# Patient Record
Sex: Female | Born: 1990 | Race: White | Hispanic: No | Marital: Single | Smoking: Current every day smoker
Health system: Southern US, Community
[De-identification: ages and names within clinical notes are randomized; demographics above are authoritative.]

## PROBLEM LIST (undated history)

## (undated) ENCOUNTER — Inpatient Hospital Stay (HOSPITAL_COMMUNITY): Payer: Medicaid Other

## (undated) DIAGNOSIS — O219 Vomiting of pregnancy, unspecified: Secondary | ICD-10-CM

## (undated) DIAGNOSIS — N83209 Unspecified ovarian cyst, unspecified side: Secondary | ICD-10-CM

## (undated) DIAGNOSIS — G8929 Other chronic pain: Secondary | ICD-10-CM

## (undated) DIAGNOSIS — Z3490 Encounter for supervision of normal pregnancy, unspecified, unspecified trimester: Secondary | ICD-10-CM

## (undated) DIAGNOSIS — R102 Pelvic and perineal pain: Secondary | ICD-10-CM

## (undated) DIAGNOSIS — M549 Dorsalgia, unspecified: Secondary | ICD-10-CM

## (undated) DIAGNOSIS — Z309 Encounter for contraceptive management, unspecified: Secondary | ICD-10-CM

## (undated) DIAGNOSIS — R87629 Unspecified abnormal cytological findings in specimens from vagina: Secondary | ICD-10-CM

## (undated) DIAGNOSIS — F1190 Opioid use, unspecified, uncomplicated: Secondary | ICD-10-CM

## (undated) DIAGNOSIS — Z3046 Encounter for surveillance of implantable subdermal contraceptive: Principal | ICD-10-CM

## (undated) HISTORY — DX: Unspecified abnormal cytological findings in specimens from vagina: R87.629

## (undated) HISTORY — DX: Encounter for supervision of normal pregnancy, unspecified, unspecified trimester: Z34.90

## (undated) HISTORY — DX: Opioid use, unspecified, uncomplicated: F11.90

## (undated) HISTORY — DX: Encounter for contraceptive management, unspecified: Z30.9

## (undated) HISTORY — DX: Encounter for surveillance of implantable subdermal contraceptive: Z30.46

## (undated) HISTORY — DX: Vomiting of pregnancy, unspecified: O21.9

---
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
## 2008-02-20 ENCOUNTER — Emergency Department: Payer: Self-pay | Admitting: Emergency Medicine

## 2009-01-08 ENCOUNTER — Emergency Department (HOSPITAL_COMMUNITY): Admission: EM | Admit: 2009-01-08 | Discharge: 2009-01-08 | Payer: Self-pay | Admitting: Emergency Medicine

## 2009-06-03 ENCOUNTER — Emergency Department (HOSPITAL_COMMUNITY): Admission: EM | Admit: 2009-06-03 | Discharge: 2009-06-04 | Payer: Self-pay | Admitting: Emergency Medicine

## 2009-09-10 ENCOUNTER — Emergency Department (HOSPITAL_COMMUNITY): Admission: EM | Admit: 2009-09-10 | Discharge: 2009-09-10 | Payer: Self-pay | Admitting: Emergency Medicine

## 2009-10-17 ENCOUNTER — Emergency Department (HOSPITAL_COMMUNITY): Admission: EM | Admit: 2009-10-17 | Discharge: 2009-10-18 | Payer: Self-pay | Admitting: Emergency Medicine

## 2009-11-05 ENCOUNTER — Emergency Department (HOSPITAL_COMMUNITY): Admission: EM | Admit: 2009-11-05 | Discharge: 2009-11-06 | Payer: Self-pay | Admitting: Emergency Medicine

## 2010-02-13 ENCOUNTER — Emergency Department (HOSPITAL_COMMUNITY): Admission: EM | Admit: 2010-02-13 | Discharge: 2010-02-13 | Payer: Self-pay | Admitting: Emergency Medicine

## 2010-02-18 ENCOUNTER — Encounter (INDEPENDENT_AMBULATORY_CARE_PROVIDER_SITE_OTHER): Payer: Self-pay | Admitting: *Deleted

## 2010-02-27 ENCOUNTER — Encounter (INDEPENDENT_AMBULATORY_CARE_PROVIDER_SITE_OTHER): Payer: Self-pay | Admitting: *Deleted

## 2011-01-08 ENCOUNTER — Emergency Department (HOSPITAL_COMMUNITY)
Admission: EM | Admit: 2011-01-08 | Discharge: 2011-01-08 | Payer: Self-pay | Source: Home / Self Care | Admitting: Emergency Medicine

## 2011-01-12 LAB — BASIC METABOLIC PANEL
BUN: 6 mg/dL (ref 6–23)
CO2: 24 mEq/L (ref 19–32)
Calcium: 9.5 mg/dL (ref 8.4–10.5)
Chloride: 104 mEq/L (ref 96–112)
Creatinine, Ser: 0.9 mg/dL (ref 0.4–1.2)
GFR calc Af Amer: >60 mL/min (ref >60)
GFR calc non Af Amer: >60 mL/min (ref >60)
Glucose, Bld: 89 mg/dL (ref 70–99)
Potassium: 4 mEq/L (ref 3.5–5.1)
Sodium: 140 mEq/L (ref 135–145)

## 2011-01-12 LAB — URINALYSIS, ROUTINE W REFLEX MICROSCOPIC
Nitrite: NEGATIVE
Specific Gravity, Urine: 1.02 (ref 1.005–1.030)
Urine Glucose, Fasting: NEGATIVE mg/dL
Urobilinogen, UA: 0.2 mg/dL (ref 0.0–1.0)
pH: 7 (ref 5.0–8.0)

## 2011-01-12 LAB — DIFFERENTIAL
Basophils Absolute: 0 10*3/uL (ref 0.0–0.1)
Basophils Relative: 0 % (ref 0–1)
Eosinophils Absolute: 0.1 10*3/uL (ref 0.0–0.7)
Eosinophils Relative: 1 % (ref 0–5)
Lymphocytes Relative: 18 % (ref 12–46)
Lymphs Abs: 1.6 10*3/uL (ref 0.7–4.0)
Monocytes Absolute: 0.3 10*3/uL (ref 0.1–1.0)
Monocytes Relative: 4 % (ref 3–12)
Neutro Abs: 7 10*3/uL (ref 1.7–7.7)
Neutrophils Relative %: 77 % (ref 43–77)

## 2011-01-12 LAB — URINE MICROSCOPIC-ADD ON

## 2011-01-12 LAB — CBC
HCT: 40.2 % (ref 36.0–46.0)
Hemoglobin: 13.5 g/dL (ref 12.0–15.0)
MCH: 30.2 pg (ref 26.0–34.0)
MCHC: 33.6 g/dL (ref 30.0–36.0)
MCV: 89.9 fL (ref 78.0–100.0)
Platelets: 297 10*3/uL (ref 150–400)
RBC: 4.47 MIL/uL (ref 3.87–5.11)
RDW: 12.2 % (ref 11.5–15.5)
WBC: 9.1 10*3/uL (ref 4.0–10.5)

## 2011-01-12 LAB — PREGNANCY, URINE: Preg Test, Ur: NEGATIVE

## 2011-01-24 ENCOUNTER — Emergency Department (HOSPITAL_COMMUNITY)
Admission: EM | Admit: 2011-01-24 | Discharge: 2011-01-24 | Payer: Self-pay | Source: Home / Self Care | Admitting: Emergency Medicine

## 2011-01-27 NOTE — Letter (Signed)
Summary: Generic Letter, Intro to Referring  [facility]  [address]   [place], [place] [address]   Phone: [phone]  Fax: [phone]      February 27, 2010             RE: Rebekah [name]   [birth year]                 [address] [place]  [address]  Dear Kemper Durie,  You referred this patient to our office for consult. We have tried to contact patient by phone and mail. Patient has failed to contact our office for appointment.   Sincerely,  [facility] Ph: [phone]   Fax: [phone]

## 2011-01-27 NOTE — Letter (Signed)
Summary: Appointment Reminder  [facility]  [address]   [place], [place] [address]   Phone: [phone]  Fax: [phone]       February 18, 2010   [place] [address] [place], [place]  [address] Jun 19, 1991    Dear Ms. Eastman,  We have been unable to reach you by phone to schedule a follow up   appointment that was recommended for you by Dr. Darrick Penna. It is very   important that we reach you to schedule an appointment. We hope that you  allow [place] to participate in your health care needs. Please contact us at  [phone] at your earliest convenience to schedule your appointment.  Sincerely,    [facility] R. Roetta Sessions, M.D.    Kassie Mends, M.D. Lorenza Burton, FNP-BC    Tana Coast, PA-C Phone: [phone]    Fax: [phone]

## 2011-02-09 ENCOUNTER — Emergency Department (HOSPITAL_COMMUNITY)
Admission: EM | Admit: 2011-02-09 | Discharge: 2011-02-09 | Discharge status: Against Medical Advice | Payer: Medicaid Other | Attending: Emergency Medicine | Admitting: Emergency Medicine

## 2011-02-09 DIAGNOSIS — R109 Unspecified abdominal pain: Secondary | ICD-10-CM | POA: Insufficient documentation

## 2011-02-09 DIAGNOSIS — R112 Nausea with vomiting, unspecified: Secondary | ICD-10-CM | POA: Insufficient documentation

## 2011-02-09 LAB — URINALYSIS, ROUTINE W REFLEX MICROSCOPIC
Bilirubin Urine: NEGATIVE
Hgb urine dipstick: NEGATIVE
Ketones, ur: NEGATIVE mg/dL
Nitrite: NEGATIVE
Protein, ur: NEGATIVE mg/dL
Specific Gravity, Urine: <1.005 — ABNORMAL LOW (ref 1.005–1.030)
Urine Glucose, Fasting: NEGATIVE mg/dL
Urobilinogen, UA: 0.2 mg/dL (ref 0.0–1.0)
pH: 6.5 (ref 5.0–8.0)

## 2011-02-09 LAB — POCT PREGNANCY, URINE: Preg Test, Ur: NEGATIVE

## 2011-02-12 ENCOUNTER — Emergency Department (HOSPITAL_COMMUNITY)
Admission: EM | Admit: 2011-02-12 | Discharge: 2011-02-13 | Disposition: A | Discharge status: Home / Self Care | Payer: Medicaid Other | Attending: Emergency Medicine | Admitting: Emergency Medicine

## 2011-02-12 DIAGNOSIS — E876 Hypokalemia: Secondary | ICD-10-CM | POA: Insufficient documentation

## 2011-02-12 DIAGNOSIS — R109 Unspecified abdominal pain: Secondary | ICD-10-CM | POA: Insufficient documentation

## 2011-02-12 DIAGNOSIS — R7989 Other specified abnormal findings of blood chemistry: Secondary | ICD-10-CM | POA: Insufficient documentation

## 2011-02-12 DIAGNOSIS — R112 Nausea with vomiting, unspecified: Secondary | ICD-10-CM | POA: Insufficient documentation

## 2011-02-12 LAB — DIFFERENTIAL
Basophils Absolute: 0 10*3/uL (ref 0.0–0.1)
Basophils Relative: 0 % (ref 0–1)
Eosinophils Absolute: 0.1 10*3/uL (ref 0.0–0.7)
Eosinophils Relative: 1 % (ref 0–5)
Lymphocytes Relative: 21 % (ref 12–46)
Lymphs Abs: 2.5 10*3/uL (ref 0.7–4.0)
Monocytes Absolute: 0.5 10*3/uL (ref 0.1–1.0)
Monocytes Relative: 4 % (ref 3–12)
Neutro Abs: 9 10*3/uL — ABNORMAL HIGH (ref 1.7–7.7)
Neutrophils Relative %: 74 % (ref 43–77)

## 2011-02-12 LAB — CBC
HCT: 37.2 % (ref 36.0–46.0)
Hemoglobin: 12.6 g/dL (ref 12.0–15.0)
MCH: 30.4 pg (ref 26.0–34.0)
MCHC: 33.9 g/dL (ref 30.0–36.0)
MCV: 89.6 fL (ref 78.0–100.0)
Platelets: 311 10*3/uL (ref 150–400)
RBC: 4.15 MIL/uL (ref 3.87–5.11)
RDW: 12.3 % (ref 11.5–15.5)
WBC: 12.2 10*3/uL — ABNORMAL HIGH (ref 4.0–10.5)

## 2011-02-12 LAB — POCT PREGNANCY, URINE: Preg Test, Ur: NEGATIVE

## 2011-02-13 LAB — URINALYSIS, ROUTINE W REFLEX MICROSCOPIC
Bilirubin Urine: NEGATIVE
Ketones, ur: NEGATIVE mg/dL
Leukocytes, UA: NEGATIVE
Nitrite: NEGATIVE
Protein, ur: NEGATIVE mg/dL
Specific Gravity, Urine: 1.02 (ref 1.005–1.030)
Urine Glucose, Fasting: NEGATIVE mg/dL
Urobilinogen, UA: 0.2 mg/dL (ref 0.0–1.0)
pH: 6.5 (ref 5.0–8.0)

## 2011-02-13 LAB — COMPREHENSIVE METABOLIC PANEL
ALT: 86 U/L — ABNORMAL HIGH (ref 0–35)
AST: 57 U/L — ABNORMAL HIGH (ref 0–37)
Albumin: 4 g/dL (ref 3.5–5.2)
Alkaline Phosphatase: 71 U/L (ref 39–117)
BUN: 5 mg/dL — ABNORMAL LOW (ref 6–23)
CO2: 24 mEq/L (ref 19–32)
Calcium: 9.2 mg/dL (ref 8.4–10.5)
Chloride: 108 mEq/L (ref 96–112)
Creatinine, Ser: 0.76 mg/dL (ref 0.4–1.2)
GFR calc Af Amer: >60 mL/min (ref >60)
GFR calc non Af Amer: >60 mL/min (ref >60)
Glucose, Bld: 115 mg/dL — ABNORMAL HIGH (ref 70–99)
Potassium: 2.9 mEq/L — ABNORMAL LOW (ref 3.5–5.1)
Sodium: 140 mEq/L (ref 135–145)
Total Bilirubin: 0.5 mg/dL (ref 0.3–1.2)
Total Protein: 6.5 g/dL (ref 6.0–8.3)

## 2011-02-13 LAB — LIPASE, BLOOD: Lipase: 26 U/L (ref 11–59)

## 2011-02-13 LAB — URINE MICROSCOPIC-ADD ON

## 2011-03-18 LAB — DIFFERENTIAL
Basophils Absolute: 0 10*3/uL (ref 0.0–0.1)
Basophils Relative: 1 % (ref 0–1)
Eosinophils Absolute: 0.1 10*3/uL (ref 0.0–0.7)
Eosinophils Relative: 2 % (ref 0–5)
Lymphocytes Relative: 31 % (ref 12–46)
Lymphs Abs: 2.2 10*3/uL (ref 0.7–4.0)
Monocytes Absolute: 0.4 10*3/uL (ref 0.1–1.0)
Monocytes Relative: 6 % (ref 3–12)
Neutro Abs: 4.3 10*3/uL (ref 1.7–7.7)
Neutrophils Relative %: 61 % (ref 43–77)

## 2011-03-18 LAB — BASIC METABOLIC PANEL
BUN: 1 mg/dL — ABNORMAL LOW (ref 6–23)
CO2: 29 mEq/L (ref 19–32)
Calcium: 9 mg/dL (ref 8.4–10.5)
Chloride: 105 mEq/L (ref 96–112)
Creatinine, Ser: 0.62 mg/dL (ref 0.4–1.2)
GFR calc Af Amer: >60 mL/min (ref >60)
GFR calc non Af Amer: >60 mL/min (ref >60)
Glucose, Bld: 93 mg/dL (ref 70–99)
Potassium: 3.2 mEq/L — ABNORMAL LOW (ref 3.5–5.1)
Sodium: 140 mEq/L (ref 135–145)

## 2011-03-18 LAB — URINALYSIS, ROUTINE W REFLEX MICROSCOPIC
Bilirubin Urine: NEGATIVE
Glucose, UA: NEGATIVE mg/dL
Hgb urine dipstick: NEGATIVE
Ketones, ur: NEGATIVE mg/dL
Nitrite: NEGATIVE
Protein, ur: NEGATIVE mg/dL
Specific Gravity, Urine: <1.005 — ABNORMAL LOW (ref 1.005–1.030)
Urobilinogen, UA: 0.2 mg/dL (ref 0.0–1.0)
pH: 6.5 (ref 5.0–8.0)

## 2011-03-18 LAB — CBC
HCT: 37 % (ref 36.0–46.0)
Hemoglobin: 12.5 g/dL (ref 12.0–15.0)
MCHC: 33.8 g/dL (ref 30.0–36.0)
MCV: 90.3 fL (ref 78.0–100.0)
Platelets: 265 10*3/uL (ref 150–400)
RBC: 4.1 MIL/uL (ref 3.87–5.11)
RDW: 11.9 % (ref 11.5–15.5)
WBC: 7 10*3/uL (ref 4.0–10.5)

## 2011-03-18 LAB — PREGNANCY, URINE: Preg Test, Ur: NEGATIVE

## 2011-04-01 LAB — URINALYSIS, ROUTINE W REFLEX MICROSCOPIC
Ketones, ur: NEGATIVE mg/dL
Leukocytes, UA: NEGATIVE
Nitrite: NEGATIVE
Protein, ur: NEGATIVE mg/dL
pH: 7 (ref 5.0–8.0)

## 2011-04-01 LAB — CBC
MCHC: 34.2 g/dL (ref 30.0–36.0)
MCV: 90.8 fL (ref 78.0–100.0)
Platelets: 227 10*3/uL (ref 150–400)
WBC: 8.6 10*3/uL (ref 4.0–10.5)

## 2011-04-01 LAB — DIFFERENTIAL
Eosinophils Relative: 2 % (ref 0–5)
Lymphocytes Relative: 30 % (ref 12–46)
Lymphs Abs: 2.6 10*3/uL (ref 0.7–4.0)
Monocytes Absolute: 0.4 10*3/uL (ref 0.1–1.0)
Monocytes Relative: 5 % (ref 3–12)

## 2011-04-01 LAB — COMPREHENSIVE METABOLIC PANEL
AST: 22 U/L (ref 0–37)
Albumin: 3.9 g/dL (ref 3.5–5.2)
Calcium: 9.1 mg/dL (ref 8.4–10.5)
Creatinine, Ser: 0.67 mg/dL (ref 0.4–1.2)
GFR calc Af Amer: >60 mL/min (ref >60)
Sodium: 138 mEq/L (ref 135–145)

## 2011-04-01 LAB — URINE MICROSCOPIC-ADD ON

## 2011-04-01 LAB — PREGNANCY, URINE: Preg Test, Ur: NEGATIVE

## 2011-04-02 LAB — URINALYSIS, ROUTINE W REFLEX MICROSCOPIC
Bilirubin Urine: NEGATIVE
Glucose, UA: NEGATIVE mg/dL
Nitrite: NEGATIVE
Specific Gravity, Urine: 1.015 (ref 1.005–1.030)
pH: 6 (ref 5.0–8.0)

## 2011-04-02 LAB — WET PREP, GENITAL: Yeast Wet Prep HPF POC: NONE SEEN

## 2011-04-02 LAB — URINE MICROSCOPIC-ADD ON

## 2011-04-03 LAB — URINALYSIS, ROUTINE W REFLEX MICROSCOPIC
Protein, ur: NEGATIVE mg/dL
Urobilinogen, UA: 1 mg/dL (ref 0.0–1.0)

## 2011-04-03 LAB — URINE MICROSCOPIC-ADD ON

## 2011-04-03 LAB — STREP A DNA PROBE: Group A Strep Probe: NEGATIVE

## 2011-04-03 LAB — RAPID STREP SCREEN (MED CTR MEBANE ONLY): Streptococcus, Group A Screen (Direct): NEGATIVE

## 2011-04-05 ENCOUNTER — Emergency Department (HOSPITAL_COMMUNITY)
Admission: EM | Admit: 2011-04-05 | Discharge: 2011-04-05 | Disposition: A | Discharge status: Home / Self Care | Payer: Self-pay | Attending: Emergency Medicine | Admitting: Emergency Medicine

## 2011-04-05 DIAGNOSIS — J029 Acute pharyngitis, unspecified: Secondary | ICD-10-CM | POA: Insufficient documentation

## 2011-04-05 DIAGNOSIS — R059 Cough, unspecified: Secondary | ICD-10-CM | POA: Insufficient documentation

## 2011-04-05 DIAGNOSIS — R05 Cough: Secondary | ICD-10-CM | POA: Insufficient documentation

## 2011-04-05 LAB — RAPID STREP SCREEN (MED CTR MEBANE ONLY): Streptococcus, Group A Screen (Direct): NEGATIVE

## 2011-04-06 LAB — URINALYSIS, ROUTINE W REFLEX MICROSCOPIC
Nitrite: NEGATIVE
Protein, ur: NEGATIVE mg/dL
Urobilinogen, UA: 1 mg/dL (ref 0.0–1.0)

## 2011-04-06 LAB — URINE MICROSCOPIC-ADD ON

## 2011-04-06 LAB — PREGNANCY, URINE: Preg Test, Ur: NEGATIVE

## 2011-06-08 ENCOUNTER — Emergency Department (HOSPITAL_COMMUNITY)
Admission: EM | Admit: 2011-06-08 | Discharge: 2011-06-08 | Disposition: A | Discharge status: Home / Self Care | Payer: Self-pay | Attending: Emergency Medicine | Admitting: Emergency Medicine

## 2011-06-08 DIAGNOSIS — F17200 Nicotine dependence, unspecified, uncomplicated: Secondary | ICD-10-CM | POA: Insufficient documentation

## 2011-06-08 DIAGNOSIS — K219 Gastro-esophageal reflux disease without esophagitis: Secondary | ICD-10-CM | POA: Insufficient documentation

## 2011-06-08 DIAGNOSIS — R1013 Epigastric pain: Secondary | ICD-10-CM | POA: Insufficient documentation

## 2011-06-08 LAB — URINALYSIS, ROUTINE W REFLEX MICROSCOPIC
Glucose, UA: NEGATIVE mg/dL
Ketones, ur: NEGATIVE mg/dL
Specific Gravity, Urine: 1.025 (ref 1.005–1.030)
pH: 6.5 (ref 5.0–8.0)

## 2011-06-08 LAB — POCT I-STAT, CHEM 8
BUN: 7 mg/dL (ref 6–23)
Calcium, Ion: 1.17 mmol/L (ref 1.12–1.32)
Creatinine, Ser: 0.7 mg/dL (ref 0.4–1.2)
TCO2: 25 mmol/L (ref 0–100)

## 2011-06-08 LAB — URINE MICROSCOPIC-ADD ON

## 2011-07-28 ENCOUNTER — Emergency Department (HOSPITAL_COMMUNITY)
Admission: EM | Admit: 2011-07-28 | Discharge: 2011-07-28 | Disposition: A | Discharge status: Home / Self Care | Payer: Self-pay | Attending: Emergency Medicine | Admitting: Emergency Medicine

## 2011-07-28 ENCOUNTER — Encounter: Payer: Self-pay | Admitting: Emergency Medicine

## 2011-07-28 DIAGNOSIS — N390 Urinary tract infection, site not specified: Secondary | ICD-10-CM | POA: Insufficient documentation

## 2011-07-28 DIAGNOSIS — F17200 Nicotine dependence, unspecified, uncomplicated: Secondary | ICD-10-CM | POA: Insufficient documentation

## 2011-07-28 DIAGNOSIS — R112 Nausea with vomiting, unspecified: Secondary | ICD-10-CM | POA: Insufficient documentation

## 2011-07-28 LAB — URINALYSIS, ROUTINE W REFLEX MICROSCOPIC
Hgb urine dipstick: NEGATIVE
Ketones, ur: 15 mg/dL — AB
Protein, ur: 30 mg/dL — AB
Specific Gravity, Urine: >1.03 — ABNORMAL HIGH (ref 1.005–1.030)
Urobilinogen, UA: 1 mg/dL (ref 0.0–1.0)

## 2011-07-28 LAB — BASIC METABOLIC PANEL
BUN: 12 mg/dL (ref 6–23)
Calcium: 9.5 mg/dL (ref 8.4–10.5)
GFR calc non Af Amer: >60 mL/min (ref >60)
Glucose, Bld: 89 mg/dL (ref 70–99)
Sodium: 140 mEq/L (ref 135–145)

## 2011-07-28 LAB — URINE MICROSCOPIC-ADD ON

## 2011-07-28 LAB — DIFFERENTIAL
Eosinophils Absolute: 0.1 10*3/uL (ref 0.0–0.7)
Eosinophils Relative: 1 % (ref 0–5)
Lymphs Abs: 1.7 10*3/uL (ref 0.7–4.0)

## 2011-07-28 LAB — CBC
MCH: 30.5 pg (ref 26.0–34.0)
MCV: 91.9 fL (ref 78.0–100.0)
Platelets: 300 10*3/uL (ref 150–400)
RBC: 4.72 MIL/uL (ref 3.87–5.11)

## 2011-07-28 MED ORDER — GI COCKTAIL ~~LOC~~
30.0000 mL | Freq: Once | ORAL | Status: AC
  Administered 2011-07-28: 30 mL via ORAL

## 2011-07-28 MED ORDER — ONDANSETRON HCL 4 MG/2ML IJ SOLN
4.0000 mg | Freq: Once | INTRAMUSCULAR | Status: AC
  Administered 2011-07-28: 4 mg via INTRAVENOUS

## 2011-07-28 MED ORDER — NITROFURANTOIN MONOHYD MACRO 100 MG PO CAPS: 100.0000 mg | ORAL_CAPSULE | Freq: Two times a day (BID) | ORAL | Status: DC

## 2011-07-28 MED ORDER — ONDANSETRON HCL 4 MG PO TABS: 4.0000 mg | ORAL_TABLET | Freq: Four times a day (QID) | ORAL | Status: DC

## 2011-07-28 MED ORDER — HYDROMORPHONE HCL 1 MG/ML IJ SOLN
1.0000 mg | Freq: Once | INTRAMUSCULAR | Status: AC
  Administered 2011-07-28: 1 mg via INTRAVENOUS

## 2011-07-28 MED ORDER — FAMOTIDINE 20 MG PO TABS
20.0000 mg | ORAL_TABLET | Freq: Once | ORAL | Status: AC
  Administered 2011-07-28: 20 mg via ORAL

## 2011-07-28 MED ORDER — NITROFURANTOIN MACROCRYSTAL 100 MG PO CAPS
100.0000 mg | ORAL_CAPSULE | Freq: Once | ORAL | Status: AC
  Administered 2011-07-28: 100 mg via ORAL

## 2011-07-28 MED ORDER — SODIUM CHLORIDE 0.9 % IV SOLN
Freq: Once | INTRAVENOUS | Status: AC
  Administered 2011-07-28: 21:00:00 via INTRAVENOUS

## 2011-07-28 NOTE — ED Notes (Addendum)
Medicated per md order for nausea and 9/10 pain. Denies any needs at this time. Nad. Call bell at bs. Bed in low position and locked with side rails up. Given warm blanket per request.

## 2011-07-28 NOTE — ED Notes (Signed)
Medicated per md order for nausea and 5\10 pain. Nad. Denies any needs at this time. Call bell at bs.

## 2011-07-28 NOTE — ED Notes (Signed)
Pt c/o vomiting since this am and states she cannot keep anything down.

## 2011-07-28 NOTE — ED Notes (Signed)
Medicated as ordered per md. Tolerated well. Denies any needs. Call bell at bs.

## 2011-07-28 NOTE — ED Provider Notes (Addendum)
History     Chief Complaint  Patient presents with  . Emesis   Patient is a 20 y.o. female presenting with vomiting. The history is provided by the patient. No language interpreter was used.  Emesis  This is a new problem. Episode onset: this AM. Episode frequency: 20= episodes today. The problem has been gradually improving. The emesis has an appearance of bilious material. There has been no fever. Associated symptoms include abdominal pain and a fever. Pertinent negatives include no diarrhea.    History reviewed. No pertinent past medical history.  History reviewed. No pertinent past surgical history.  Family History  Problem Relation Age of Onset  . Hypertension Other   . Diabetes Other     History  Substance Use Topics  . Smoking status: Current Everyday Smoker -- 1.0 packs/day    Types: Cigarettes  . Smokeless tobacco: Not on file  . Alcohol Use: Yes     saturday    OB History    Grav Para Term Preterm Abortions TAB SAB Ect Mult Living                  Review of Systems  Constitutional: Positive for fever and appetite change.  Gastrointestinal: Positive for vomiting and abdominal pain. Negative for diarrhea, blood in stool, abdominal distention and anal bleeding.  All other systems reviewed and are negative.    Physical Exam  BP 104/66  Pulse 78  Temp(Src) 98.5 F (36.9 C) (Oral)  Resp 18  Ht 5\' 2"  (1.575 m)  Wt 85 lb (38.556 kg)  BMI 15.55 kg/m2  SpO2 100%  Physical Exam  Nursing note and vitals reviewed. Constitutional: She is oriented to person, place, and time. Vital signs are normal. She appears well-developed and well-nourished. No distress.  HENT:  Head: Normocephalic and atraumatic.  Right Ear: External ear normal.  Left Ear: External ear normal.  Nose: Nose normal.  Mouth/Throat: No oropharyngeal exudate.  Eyes: Conjunctivae and EOM are normal. Pupils are equal, round, and reactive to light. Right eye exhibits no discharge. Left eye  exhibits no discharge. No scleral icterus.  Neck: Normal range of motion. Neck supple. No JVD present. No tracheal deviation present. No thyromegaly present.  Cardiovascular: Normal rate, regular rhythm, normal heart sounds, intact distal pulses and normal pulses.  Exam reveals no gallop and no friction rub.   No murmur heard. Pulmonary/Chest: Effort normal and breath sounds normal. No stridor. No respiratory distress. She has no wheezes. She has no rales. She exhibits no tenderness.  Abdominal: Soft. Normal appearance and bowel sounds are normal. She exhibits no distension and no mass. There is tenderness. There is guarding. There is no rebound.  Musculoskeletal: Normal range of motion. She exhibits no edema and no tenderness.  Lymphadenopathy:    She has no cervical adenopathy.  Neurological: She is alert and oriented to person, place, and time. She has normal reflexes. No cranial nerve deficit. Coordination normal. GCS eye subscore is 4. GCS verbal subscore is 5. GCS motor subscore is 6.  Reflex Scores:      Tricep reflexes are 2+ on the right side and 2+ on the left side.      Bicep reflexes are 2+ on the right side and 2+ on the left side.      Brachioradialis reflexes are 2+ on the right side and 2+ on the left side.      Patellar reflexes are 2+ on the right side and 2+ on the left side.  Achilles reflexes are 2+ on the right side and 2+ on the left side. Skin: Skin is warm and dry. No rash noted. She is not diaphoretic.  Psychiatric: She has a normal mood and affect. Her speech is normal and behavior is normal. Judgment and thought content normal. Cognition and memory are normal.    ED Course  Procedures  MDM    Medical screening examination/treatment/procedure(s) were performed by non-physician practitioner and as supervising physician I was immediately available for consultation/collaboration. Osvaldo Human, M.D.   Worthy Rancher, PA 07/28/11 2155  Worthy Rancher, PA 07/28/11 2156  Worthy Rancher, PA 07/28/11 2157  Carleene Cooper III, MD 07/29/11 1004  Carleene Cooper III, MD 09/04/11 [phone]

## 2011-07-28 NOTE — ED Notes (Signed)
Into room to see pt. Pain 5/10. Denies nausea. Call bell at bs. Bed in low position and locked with side rails up. Will cont to monitor.

## 2011-07-28 NOTE — ED Notes (Signed)
INTO ROOM TO SEE PT. RESTING SITTING UP IN BED. PAIN 3/10. DENIES NAUSEA. STATES SHE IS FEELING A LOT BETTER AND HASN'T HAD ANY ADDITIONAL EPISODES OF VOMITING. DENIES ANY NEEDS AT THIS TIME. CALL BELL AT BS.

## 2011-07-28 NOTE — ED Notes (Signed)
pa at bedside to evaluate.

## 2011-07-28 NOTE — ED Notes (Signed)
INTO ROOM TO SEE PT. PAIN 3/10. DENIES NAUSEA. DENIES ANY NEEDS. SITTING UP IN BED WATCHING TV. CALL BELL AT BS. WILL CONT TO MONITOR.

## 2011-07-28 NOTE — ED Notes (Signed)
PT OBTAINING URINE SPECIMEN VIA CLEAN CATCH AT THIS TIME. PER TRIAGE NURSE, PT HAS BEEN EATING DORITOS AND HAS DR. PEPPER IN BAG.

## 2011-07-28 NOTE — ED Notes (Signed)
MD at bedside to speak with pt about plan of care.

## 2011-07-28 NOTE — ED Notes (Signed)
INTO ROOM TO ASSESS PT. PT STATES SHE HAS HAD VOMITING AND BILATERAL UPPER QUADRANT ABD PAIN X 1 DAY. HASN'T BEEN ABLE TO KEEP ANYTHING DOWN ALL DAY. STATES PAIN IS A "RAW, SHARP, AND BURNING PAIN" THAT IS CONSTANT. NOTHING MAKES PAIN WORSE. BENDING OVER MAKES PAIN BETTER. LMP IS UNKNOWN DUE TO BIRTH CONTROL. TENDER ON PALPATION OF UPPER ABD QUADS. GIVEN WARM BLANKET PER REQUEST. DENIES ANY OTHER NEEDS. PENDING MD EVAL.

## 2011-07-29 ENCOUNTER — Encounter (HOSPITAL_COMMUNITY): Payer: Self-pay | Admitting: *Deleted

## 2011-07-29 ENCOUNTER — Emergency Department (HOSPITAL_COMMUNITY): Payer: Self-pay

## 2011-07-29 ENCOUNTER — Emergency Department (HOSPITAL_COMMUNITY)
Admission: EM | Admit: 2011-07-29 | Discharge: 2011-07-29 | Discharge status: Against Medical Advice | Payer: Self-pay | Attending: Emergency Medicine | Admitting: Emergency Medicine

## 2011-07-29 ENCOUNTER — Inpatient Hospital Stay (HOSPITAL_COMMUNITY)
Admission: EM | Admit: 2011-07-29 | Discharge: 2011-08-02 | DRG: 418 | Disposition: A | Discharge status: Home / Self Care | Payer: Self-pay | Attending: General Surgery | Admitting: General Surgery

## 2011-07-29 DIAGNOSIS — K8020 Calculus of gallbladder without cholecystitis without obstruction: Secondary | ICD-10-CM | POA: Insufficient documentation

## 2011-07-29 DIAGNOSIS — R110 Nausea: Secondary | ICD-10-CM | POA: Diagnosis not present

## 2011-07-29 DIAGNOSIS — Z5320 Procedure and treatment not carried out because of patient's decision for unspecified reasons: Secondary | ICD-10-CM | POA: Insufficient documentation

## 2011-07-29 DIAGNOSIS — K929 Disease of digestive system, unspecified: Secondary | ICD-10-CM | POA: Diagnosis not present

## 2011-07-29 DIAGNOSIS — Y836 Removal of other organ (partial) (total) as the cause of abnormal reaction of the patient, or of later complication, without mention of misadventure at the time of the procedure: Secondary | ICD-10-CM | POA: Diagnosis not present

## 2011-07-29 DIAGNOSIS — K810 Acute cholecystitis: Principal | ICD-10-CM | POA: Diagnosis present

## 2011-07-29 DIAGNOSIS — R1013 Epigastric pain: Secondary | ICD-10-CM

## 2011-07-29 LAB — COMPREHENSIVE METABOLIC PANEL
ALT: 76 U/L — ABNORMAL HIGH (ref 0–35)
Albumin: 3.8 g/dL (ref 3.5–5.2)
Alkaline Phosphatase: 91 U/L (ref 39–117)
BUN: 7 mg/dL (ref 6–23)
Calcium: 9.1 mg/dL (ref 8.4–10.5)
Potassium: 3.9 mEq/L (ref 3.5–5.1)
Sodium: 139 mEq/L (ref 135–145)
Total Protein: 6.8 g/dL (ref 6.0–8.3)

## 2011-07-29 LAB — CBC
MCHC: 32.8 g/dL (ref 30.0–36.0)
MCV: 93.5 fL (ref 78.0–100.0)
Platelets: 237 10*3/uL (ref 150–400)
RBC: 4.01 MIL/uL (ref 3.87–5.11)
RDW: 12.7 % (ref 11.5–15.5)
RDW: 12.7 % (ref 11.5–15.5)
WBC: 6.3 10*3/uL (ref 4.0–10.5)

## 2011-07-29 LAB — LIPASE, BLOOD: Lipase: 26 U/L (ref 11–59)

## 2011-07-29 LAB — DIFFERENTIAL
Basophils Absolute: 0 10*3/uL (ref 0.0–0.1)
Eosinophils Relative: 1 % (ref 0–5)
Lymphocytes Relative: 35 % (ref 12–46)
Lymphs Abs: 2.2 10*3/uL (ref 0.7–4.0)
Neutro Abs: 3.7 10*3/uL (ref 1.7–7.7)
Neutrophils Relative %: 58 % (ref 43–77)

## 2011-07-29 LAB — POCT PREGNANCY, URINE: Preg Test, Ur: NEGATIVE

## 2011-07-29 LAB — URINALYSIS, ROUTINE W REFLEX MICROSCOPIC
Glucose, UA: NEGATIVE mg/dL
Hgb urine dipstick: NEGATIVE
Specific Gravity, Urine: 1.025 (ref 1.005–1.030)
pH: 6 (ref 5.0–8.0)

## 2011-07-29 LAB — URINE MICROSCOPIC-ADD ON

## 2011-07-29 MED ORDER — HYDROMORPHONE HCL 1 MG/ML IJ SOLN
1.0000 mg | Freq: Once | INTRAMUSCULAR | Status: AC
  Administered 2011-07-29: 1 mg via INTRAVENOUS

## 2011-07-29 MED ORDER — SODIUM CHLORIDE 0.9 % IV BOLUS (SEPSIS)
1000.0000 mL | Freq: Once | INTRAVENOUS | Status: AC
  Administered 2011-07-29: 1000 mL via INTRAVENOUS

## 2011-07-29 MED ORDER — IOHEXOL 300 MG/ML  SOLN
100.0000 mL | Freq: Once | INTRAMUSCULAR | Status: AC | PRN
  Administered 2011-07-29: 100 mL via INTRAVENOUS

## 2011-07-29 MED ORDER — ONDANSETRON HCL 4 MG/2ML IJ SOLN
4.0000 mg | Freq: Once | INTRAMUSCULAR | Status: AC
  Administered 2011-07-29: 4 mg via INTRAVENOUS

## 2011-07-29 MED ORDER — SODIUM CHLORIDE 0.9 % IV SOLN
INTRAVENOUS | Status: DC
  Administered 2011-07-29: 13:00:00 via INTRAVENOUS

## 2011-07-29 NOTE — ED Notes (Signed)
Pt c/o abd cramping with n/v. Pt was here for the same yesterday and left ama.

## 2011-07-29 NOTE — ED Notes (Signed)
Pt had to leave due to family problems. Pt advised not to leave and stay to get treatment.

## 2011-07-29 NOTE — ED Notes (Signed)
Pt was seen here last night and was told she had a uti. Pt c/o continued upper abdominal pain and n/v.

## 2011-07-29 NOTE — ED Notes (Signed)
Pt was here earlier and had to leave due to family situation, states that they were talking about admitting her, pt presents to er to continue with tx, still continues to have epigastric pain,

## 2011-07-29 NOTE — ED Provider Notes (Signed)
History    Scribed for Rebekah Jakes, MD, the patient was seen in room APA05/APA05. This chart was scribed by Clarita Crane. This patient's care was started at 12:16PM.  Chief Complaint  Patient presents with  . Abdominal Pain  . Nausea  . Emesis   HPI  Patient is a 20 year old female c/o new onset epigastric abdominal pain since yesterday with associated upper back pain, nausea and episodes of vomiting too numerous to count. Denies hematemesis, hematochezia, SOB, chest pain, rash, vagnial d/c, vaginal bleeding, HA. Patient reports she was evalauted in ED last night for same with improvement in condition upon discharge but abdominal pain became worse this morning which is the reason for her return. States she was dx with a UTI last night and prescribed Macrobid-100mg  and Zofran but has not taken the medications yet as she has not had the prescriptions filled. Patient also notes previous history of similar epigastric abdominal pain several months ago and was told by ED physician that the pain may be the result of an ulcer.   PAST MEDICAL HISTORY:  History reviewed. No pertinent past medical history.  PAST SURGICAL HISTORY:  History reviewed. No pertinent past surgical history.  MEDICATIONS:  Previous Medications   ETONOGESTREL (IMPLANON Kasson)    Inject 1 each into the skin once.       ALLERGIES:  Allergies as of 07/29/2011  . (No Known Allergies)     FAMILY HISTORY:  Family History  Problem Relation Age of Onset  . Hypertension Other   . Diabetes Other      SOCIAL HISTORY: History   Social History  . Marital Status: Single    Spouse Name: N/A    Number of Children: N/A  . Years of Education: N/A   Social History Main Topics  . Smoking status: Current Everyday Smoker -- 1.0 packs/day    Types: Cigarettes  . Smokeless tobacco: None  . Alcohol Use: Yes     saturday  . Drug Use: No  . Sexually Active:    Other Topics Concern  . None   Social History Narrative    . None      OB History    Grav Para Term Preterm Abortions TAB SAB Ect Mult Living                  Review of Systems  Constitutional: Negative for fever.  HENT: Negative for rhinorrhea.   Eyes: Negative for pain.  Respiratory: Negative for cough and shortness of breath.   Cardiovascular: Negative for chest pain.  Gastrointestinal: Positive for nausea, vomiting and abdominal pain. Negative for diarrhea.  Genitourinary: Negative for dysuria.  Musculoskeletal: Negative for back pain.  Skin: Negative for rash.  Neurological: Negative for weakness and headaches.    Physical Exam  BP 97/58  Pulse 72  Temp(Src) 98.1 F (36.7 C) (Oral)  Resp 18  Ht 5\' 2"  (1.575 m)  Wt 93 lb (42.185 kg)  BMI 17.01 kg/m2  SpO2 100%  Physical Exam  Nursing note and vitals reviewed. Constitutional: She is oriented to person, place, and time. Vital signs are normal. She appears well-developed and well-nourished. No distress.  HENT:  Head: Normocephalic and atraumatic.  Eyes: Conjunctivae are normal. Pupils are equal, round, and reactive to light.  Neck: Normal range of motion. Neck supple.  Cardiovascular: Normal rate and regular rhythm.  Exam reveals no gallop and no friction rub.   No murmur heard. Pulmonary/Chest: Effort normal and breath sounds normal.  She has no wheezes.  Abdominal: Soft. Bowel sounds are normal. She exhibits no distension. There is tenderness in the epigastric area.  Musculoskeletal: Normal range of motion. She exhibits no edema.  Neurological: She is alert and oriented to person, place, and time. No sensory deficit.  Skin: Skin is warm and dry.  Psychiatric: She has a normal mood and affect. Her behavior is normal.    ED Course  Procedures  OTHER DATA REVIEWED: Nursing notes, vital signs, and past medical records reviewed.  Previous ED visits reviewed- Patient was evaluated last night (07/28/2011) by Marisa Sprinkles for new onset nausea and vomiting. Patient had a  CBC with diff showing an elevated WBC (12.5)) and elevated Relative Neurtophils (80), UA with many bacteria present and a negative urine pregnancy test.   Patient was also evaluated on 06/08/2011 by Dr. Eber Hong for c/o abdominal pain with n/v/d and dx with epigastric pain. Patient had I-Stat Chem 8 with elevated blood glucose level, negative urine pregnancy test and UA with few bacteria and elevated leukocyte esterase in urine. Patient was d/c home after treatment in ED.  ACUTE ABDOMEN SERIES (ABDOMEN 2 VIEW & CHEST 1 VIEW)  (performed on 02/13/2011, read by Dr. Carlota Raspberry) Comparison: CT abdomen 11/06/2009.  Findings: Lungs clear. Cardiopericardial silhouette within normal  limits. Trachea midline. No airspace disease or effusion. No free  air is present underneath the hemidiaphragms. Fluid is present  within the stomach. No organomegaly. No dilated bowel or  pathologic air fluid levels. Bowel gas extend to the level of the  rectosigmoid. General piercing present.  IMPRESSION:  No active cardiopulmonary disease. Normal bowel gas pattern  CT-Abdomen (performed on 11/05/2009, read by Dr. Molli Posey) Findings: The liver, spleen, stomach, duodenum, pancreas,  gallbladder, adrenal glands, and kidneys are unremarkable.  No abdominal lymphadenopathy. No intraperitoneal free fluid.  Prominent stool noted in the abdominal segments of the colon.  IMPRESSION:  Prominent stool volume in the abdominal segments of the colon.  Otherwise unremarkable.   CT-Pelvis (performed on 11/05/2009, read by Dr. Molli Posey) Findings: No pelvic sidewall lymphadenopathy. Bladder is  unremarkable. Uterus is unremarkable. No evidence for an adnexal  mass. 16 mm collapsing cyst or follicle is identified in the right  ovary.  The terminal ileum is normal. The appendix is normal.  Bone windows reveal no worrisome lytic or sclerotic osseous  lesions.  Trace amount of intraperitoneal free fluid is identified in the  pelvis.    IMPRESSION:  No acute findings in the pelvis. Trace amount of free fluid can be  physiologic in a premenopausal female.  Prominent stool throughout the colon. Imaging features would be  compatible with clinical constipation.  DIAGNOSTIC STUDIES:   LABS / RADIOLOGY: Results for orders placed during the hospital encounter of 07/29/11  URINALYSIS, ROUTINE W REFLEX MICROSCOPIC      Component Value Range   Color, Urine YELLOW  YELLOW    Appearance CLEAR  CLEAR    Specific Gravity, Urine 1.025  1.005 - 1.030    pH 6.0  5.0 - 8.0    Glucose, UA NEGATIVE  NEGATIVE (mg/dL)   Hgb urine dipstick NEGATIVE  NEGATIVE    Bilirubin Urine SMALL (*) NEGATIVE    Ketones, ur NEGATIVE  NEGATIVE (mg/dL)   Protein, ur NEGATIVE  NEGATIVE (mg/dL)   Urobilinogen, UA 1.0  0.0 - 1.0 (mg/dL)   Nitrite NEGATIVE  NEGATIVE    Leukocytes, UA MODERATE (*) NEGATIVE   POCT PREGNANCY, URINE      Component  Value Range   Preg Test, Ur NEGATIVE    URINE MICROSCOPIC-ADD ON      Component Value Range   Squamous Epithelial / LPF MANY (*) RARE    WBC, UA 3-6  <3 (WBC/hpf)   RBC / HPF 3-6  <3 (RBC/hpf)   Bacteria, UA FEW (*) RARE   CBC      Component Value Range   WBC 7.5  4.0 - 10.5 (K/uL)   RBC 4.30  3.87 - 5.11 (MIL/uL)   Hemoglobin 13.1  12.0 - 15.0 (g/dL)   HCT 16.1  09.6 - 04.5 (%)   MCV 92.8  78.0 - 100.0 (fL)   MCH 30.5  26.0 - 34.0 (pg)   MCHC 32.8  30.0 - 36.0 (g/dL)   RDW 40.9  81.1 - 91.4 (%)   Platelets 265  150 - 400 (K/uL)  COMPREHENSIVE METABOLIC PANEL      Component Value Range   Sodium 139  135 - 145 (mEq/L)   Potassium 3.9  3.5 - 5.1 (mEq/L)   Chloride 104  96 - 112 (mEq/L)   CO2 23  19 - 32 (mEq/L)   Glucose, Bld 89  70 - 99 (mg/dL)   BUN 7  6 - 23 (mg/dL)   Creatinine, Ser 7.82  0.50 - 1.10 (mg/dL)   Calcium 9.1  8.4 - 95.6 (mg/dL)   Total Protein 6.8  6.0 - 8.3 (g/dL)   Albumin 3.8  3.5 - 5.2 (g/dL)   AST 92 (*) 0 - 37 (U/L)   ALT 76 (*) 0 - 35 (U/L)   Alkaline Phosphatase 91   39 - 117 (U/L)   Total Bilirubin 2.5 (*) 0.3 - 1.2 (mg/dL)   GFR calc non Af Amer >60  >60 (mL/min)   GFR calc Af Amer >60  >60 (mL/min)  LIPASE, BLOOD      Component Value Range   Lipase 26  11 - 59 (U/L)   Dg Chest 2 View  07/29/2011  *RADIOLOGY REPORT*  Clinical Data: Abdominal pain radiating to back, heartburn, smoker, shortness of breath  CHEST - 2 VIEW  Comparison: 02/13/2010  Findings: Normal heart size, mediastinal contours, and pulmonary vascularity. Lungs clear. No pleural effusion or pneumothorax. Bones unremarkable.  IMPRESSION: Normal exam.  Original Report Authenticated By: Lollie Marrow, M.D.   Ct Abdomen Pelvis W Contrast  07/29/2011  *RADIOLOGY REPORT*  Clinical Data: Upper mid abdominal pain, nausea  CT ABDOMEN AND PELVIS WITH CONTRAST  Technique:  Multidetector CT imaging of the abdomen and pelvis was performed following the standard protocol during bolus administration of intravenous contrast.  Contrast: 100 ml Omnipaque-300 IV  Comparison: 11/06/2009  Findings: Lung bases are clear.  Liver is notable for mildly heterogeneous perfusion and mild periportal edema. Spleen, pancreas, adrenal glands are within normal limits.  Layering gallstones (series 2/image 35).  Mild gallbladder wall edema.  Kidneys are within normal limits.  No hydronephrosis.  No evidence of bowel obstruction.  Normal appendix.  Uterus and bilateral ovaries are grossly unremarkable.  Trace pelvic fluid, likely physiologic.  Bladder is within normal limits.  Visualized osseous structures are within normal limits.  IMPRESSION: Heterogeneous enhancement of the liver with mild periportal edema. While nonspecific, this appearance is suggestive of a primary hepatic inflammatory process.  Cholelithiasis, without associated findings to suggest acute cholecystitis.  Mild gallbladder wall edema, likely secondary to hepatic process.  Original Report Authenticated By: Charline Bills, M.D.     PROCEDURES:  ED COURSE /  COORDINATION  OF CARE: 3:34PM- Patient informed of lab and imaging results. Patient reports pain improved with pain medication Upon re-evaluation patient still diffusely tender. Advised of plan to admit patient based upon lab and imaging results and re-evaluation. Patient states she cannot be admitted because she has to go home. Patient advised of risk of not being admitted and the potential problems that could develop as a result.    MDM: Differential Diagnosis:  PROLONGED BILLIARY COLIC COULD PROGRESS TO CHOLECYSTITIS. PATIENT INSISTS ON LEAVING AMA.    PLAN: Admit The patient is to return the emergency department if there is any worsening of symptoms. I have reviewed the discharge instructions with the patient/family   CONDITION ON DISCHARGE: STABLE BUT STILL WITH ABD TENDERNESS   MEDICATIONS GIVEN IN THE E.D.  Medications  nitrofurantoin, macrocrystal-monohydrate, (MACROBID) 100 MG capsule (not administered)  ondansetron (ZOFRAN) 4 MG tablet (not administered)  0.9 %  sodium chloride infusion (  Intravenous New Bag 07/29/11 1243)  HYDROmorphone (DILAUDID) injection 1 mg (1 mg Intravenous Given 07/29/11 1244)  ondansetron (ZOFRAN) injection 4 mg (4 mg Intravenous Given 07/29/11 1245)  iohexol (OMNIPAQUE) 300 MG/ML injection 100 mL (100 mL Intravenous Contrast Given 07/29/11 1454)    I personally performed the services described in this documentation, which was scribed in my presence. The recorded information has been reviewed and considered. Rebekah Jakes, MD       Rebekah Jakes, MD 07/29/11 [phone]

## 2011-07-29 NOTE — ED Notes (Signed)
Patient reporting upper abdominal pain. Patient seen here yesterday. Diagnosed with UTI. Patient reports same pain as yesterday (aching/soreness) and nausea/vomiting. Patient not actively vomiting at this time. Patient tearful. Will continue to monitor.

## 2011-07-29 NOTE — ED Notes (Signed)
Pt states she was seen and was dx with uti last night

## 2011-07-30 ENCOUNTER — Encounter (HOSPITAL_COMMUNITY): Payer: Self-pay | Admitting: *Deleted

## 2011-07-30 LAB — BASIC METABOLIC PANEL
CO2: 23 mEq/L (ref 19–32)
Calcium: 8.2 mg/dL — ABNORMAL LOW (ref 8.4–10.5)
Creatinine, Ser: 0.57 mg/dL (ref 0.50–1.10)
GFR calc non Af Amer: >60 mL/min (ref >60)
Glucose, Bld: 100 mg/dL — ABNORMAL HIGH (ref 70–99)
Sodium: 139 mEq/L (ref 135–145)

## 2011-07-30 LAB — COMPREHENSIVE METABOLIC PANEL
ALT: 74 U/L — ABNORMAL HIGH (ref 0–35)
AST: 66 U/L — ABNORMAL HIGH (ref 0–37)
Alkaline Phosphatase: 93 U/L (ref 39–117)
CO2: 23 mEq/L (ref 19–32)
Calcium: 8.8 mg/dL (ref 8.4–10.5)
Chloride: 104 mEq/L (ref 96–112)
GFR calc Af Amer: >60 mL/min (ref >60)
GFR calc non Af Amer: >60 mL/min (ref >60)
Glucose, Bld: 78 mg/dL (ref 70–99)
Potassium: 4 mEq/L (ref 3.5–5.1)
Sodium: 139 mEq/L (ref 135–145)
Total Bilirubin: 0.7 mg/dL (ref 0.3–1.2)

## 2011-07-30 LAB — CBC
MCH: 30.5 pg (ref 26.0–34.0)
MCHC: 32.1 g/dL (ref 30.0–36.0)
MCV: 94.9 fL (ref 78.0–100.0)
Platelets: 226 10*3/uL (ref 150–400)
RBC: 3.71 MIL/uL — ABNORMAL LOW (ref 3.87–5.11)

## 2011-07-30 LAB — MRSA PCR SCREENING: MRSA by PCR: NEGATIVE

## 2011-07-30 MED ORDER — HYDROMORPHONE HCL 1 MG/ML IJ SOLN
1.0000 mg | INTRAMUSCULAR | Status: DC | PRN
  Administered 2011-07-30 (×2): 1 mg via INTRAVENOUS
  Administered 2011-07-30 – 2011-07-31 (×4): 2 mg via INTRAVENOUS
  Filled 2011-07-30: qty 2
  Filled 2011-07-30: qty 1
  Filled 2011-07-30: qty 2
  Filled 2011-07-30 (×2): qty 1
  Filled 2011-07-30: qty 2

## 2011-07-30 MED ORDER — PANTOPRAZOLE SODIUM 40 MG IV SOLR
40.0000 mg | INTRAVENOUS | Status: DC
  Administered 2011-07-30 – 2011-07-31 (×2): 40 mg via INTRAVENOUS
  Filled 2011-07-30 (×2): qty 40

## 2011-07-30 MED ORDER — ENOXAPARIN SODIUM 80 MG/0.8ML ~~LOC~~ SOLN
40.0000 mg | Freq: Two times a day (BID) | SUBCUTANEOUS | Status: DC
  Administered 2011-07-31: 40 mg via SUBCUTANEOUS
  Filled 2011-07-30 (×2): qty 0.8

## 2011-07-30 MED ORDER — LACTATED RINGERS IV SOLN
INTRAVENOUS | Status: DC
  Administered 2011-07-30 – 2011-07-31 (×4): via INTRAVENOUS

## 2011-07-30 MED ORDER — SODIUM CHLORIDE 0.9 % IJ SOLN
INTRAMUSCULAR | Status: AC
  Administered 2011-07-30: 07:00:00
  Filled 2011-07-30: qty 10

## 2011-07-30 MED ORDER — ONDANSETRON HCL 4 MG/2ML IJ SOLN
4.0000 mg | Freq: Four times a day (QID) | INTRAMUSCULAR | Status: DC | PRN
  Administered 2011-07-30 – 2011-07-31 (×5): 4 mg via INTRAVENOUS
  Filled 2011-07-30 (×6): qty 2

## 2011-07-30 NOTE — ED Provider Notes (Signed)
History     CSN: [id number] Arrival date & time: 07/29/2011  9:43 PM  Chief Complaint  Patient presents with  . Abdominal Pain   Patient is a 20 y.o. female presenting with abdominal pain. The history is provided by the patient.  Abdominal Pain The primary symptoms of the illness include abdominal pain and nausea. The primary symptoms of the illness do not include fatigue or diarrhea. The current episode started 2 days ago. The onset of the illness was sudden.  The illness is associated with eating. The patient states that she believes she is currently not pregnant. The patient has not had a change in bowel habit. Symptoms associated with the illness do not include chills, anorexia, hematuria, frequency or back pain. Significant associated medical issues do not include PUD or GERD.    History reviewed. No pertinent past medical history.  History reviewed. No pertinent past surgical history.  Family History  Problem Relation Age of Onset  . Hypertension Other   . Diabetes Other     History  Substance Use Topics  . Smoking status: Current Everyday Smoker -- 1.0 packs/day    Types: Cigarettes  . Smokeless tobacco: Not on file  . Alcohol Use: Yes     saturday    OB History    Grav Para Term Preterm Abortions TAB SAB Ect Mult Living                  Review of Systems  Constitutional: Negative for chills and fatigue.  HENT: Negative for congestion, sinus pressure and ear discharge.   Eyes: Negative for discharge.  Respiratory: Negative for cough.   Cardiovascular: Negative for chest pain.  Gastrointestinal: Positive for nausea and abdominal pain. Negative for diarrhea and anorexia.  Genitourinary: Negative for frequency and hematuria.  Musculoskeletal: Negative for back pain.  Skin: Negative for rash.  Neurological: Negative for seizures and headaches.  Hematological: Negative.   Psychiatric/Behavioral: Negative for hallucinations.    Physical Exam  BP 94/52  Pulse 73   Temp(Src) 98.2 F (36.8 C) (Oral)  Resp 18  Ht 5\' 2"  (1.575 m)  Wt 93 lb (42.185 kg)  BMI 17.01 kg/m2  SpO2 100%  Physical Exam  Constitutional: She is oriented to person, place, and time. She appears well-developed.  HENT:  Head: Normocephalic and atraumatic.  Eyes: Conjunctivae and EOM are normal. No scleral icterus.  Neck: Neck supple. No thyromegaly present.  Cardiovascular: Normal rate and regular rhythm.  Exam reveals no gallop and no friction rub.   No murmur heard. Pulmonary/Chest: No stridor. She has no wheezes. She has no rales. She exhibits no tenderness.  Abdominal: She exhibits no distension. There is tenderness. There is no rebound.  Musculoskeletal: Normal range of motion. She exhibits no edema.  Lymphadenopathy:    She has no cervical adenopathy.  Neurological: She is oriented to person, place, and time. Coordination normal.  Skin: No rash noted. No erythema.  Psychiatric: She has a normal mood and affect. Her behavior is normal.    ED Course  Procedures  MDM Surgery to admit   Results for orders placed during the hospital encounter of 07/29/11  CBC      Component Value Range   WBC 6.3  4.0 - 10.5 (K/uL)   RBC 4.01  3.87 - 5.11 (MIL/uL)   Hemoglobin 12.4  12.0 - 15.0 (g/dL)   HCT 29.5  62.1 - 30.8 (%)   MCV 93.5  78.0 - 100.0 (fL)   MCH 30.9  26.0 - 34.0 (pg)   MCHC 33.1  30.0 - 36.0 (g/dL)   RDW 16.1  09.6 - 04.5 (%)   Platelets 237  150 - 400 (K/uL)  DIFFERENTIAL      Component Value Range   Neutrophils Relative 58  43 - 77 (%)   Neutro Abs 3.7  1.7 - 7.7 (K/uL)   Lymphocytes Relative 35  12 - 46 (%)   Lymphs Abs 2.2  0.7 - 4.0 (K/uL)   Monocytes Relative 5  3 - 12 (%)   Monocytes Absolute 0.3  0.1 - 1.0 (K/uL)   Eosinophils Relative 1  0 - 5 (%)   Eosinophils Absolute 0.1  0.0 - 0.7 (K/uL)   Basophils Relative 1  0 - 1 (%)   Basophils Absolute 0.0  0.0 - 0.1 (K/uL)  COMPREHENSIVE METABOLIC PANEL      Component Value Range   Sodium 139   135 - 145 (mEq/L)   Potassium 4.0  3.5 - 5.1 (mEq/L)   Chloride 104  96 - 112 (mEq/L)   CO2 23  19 - 32 (mEq/L)   Glucose, Bld 78  70 - 99 (mg/dL)   BUN 6  6 - 23 (mg/dL)   Creatinine, Ser 4.09  0.50 - 1.10 (mg/dL)   Calcium 8.8  8.4 - 81.1 (mg/dL)   Total Protein 6.5  6.0 - 8.3 (g/dL)   Albumin 3.6  3.5 - 5.2 (g/dL)   AST 66 (*) 0 - 37 (U/L)   ALT 74 (*) 0 - 35 (U/L)   Alkaline Phosphatase 93  39 - 117 (U/L)   Total Bilirubin 0.7  0.3 - 1.2 (mg/dL)   GFR calc non Af Amer >60  >60 (mL/min)   GFR calc Af Amer >60  >60 (mL/min)  LIPASE, BLOOD      Component Value Range   Lipase 41  11 - 59 (U/L)   Dg Chest 2 View  07/29/2011  *RADIOLOGY REPORT*  Clinical Data: Abdominal pain radiating to back, heartburn, smoker, shortness of breath  CHEST - 2 VIEW  Comparison: 02/13/2010  Findings: Normal heart size, mediastinal contours, and pulmonary vascularity. Lungs clear. No pleural effusion or pneumothorax. Bones unremarkable.  IMPRESSION: Normal exam.  Original Report Authenticated By: Lollie Marrow, M.D.   Ct Abdomen Pelvis W Contrast  07/29/2011  *RADIOLOGY REPORT*  Clinical Data: Upper mid abdominal pain, nausea  CT ABDOMEN AND PELVIS WITH CONTRAST  Technique:  Multidetector CT imaging of the abdomen and pelvis was performed following the standard protocol during bolus administration of intravenous contrast.  Contrast: 100 ml Omnipaque-300 IV  Comparison: 11/06/2009  Findings: Lung bases are clear.  Liver is notable for mildly heterogeneous perfusion and mild periportal edema. Spleen, pancreas, adrenal glands are within normal limits.  Layering gallstones (series 2/image 35).  Mild gallbladder wall edema.  Kidneys are within normal limits.  No hydronephrosis.  No evidence of bowel obstruction.  Normal appendix.  Uterus and bilateral ovaries are grossly unremarkable.  Trace pelvic fluid, likely physiologic.  Bladder is within normal limits.  Visualized osseous structures are within normal limits.   IMPRESSION: Heterogeneous enhancement of the liver with mild periportal edema. While nonspecific, this appearance is suggestive of a primary hepatic inflammatory process.  Cholelithiasis, without associated findings to suggest acute cholecystitis.  Mild gallbladder wall edema, likely secondary to hepatic process.  Original Report Authenticated By: Charline Bills, M.D.       Benny Lennert, MD 07/30/11 [phone]

## 2011-07-30 NOTE — H&P (Signed)
Rebekah [name] is an 20 y.o. female.   Chief Complaint: Abd Pain HPI: Pt is a 20 yo female who presented to [facility] ED again this AM with continued epigastric pain.  Pt had had similar symptoms in the past but never this severe.  Presented to the ED 2 nights ago with the same symptoms.  At that time the work-up was suspicious for a UTI and abx were given.  As the pain continued, she returned about 24 hours ago. Advised to stay, pt left AMA due to family issues.  She again returned.  Over this period, the pain has been intermittent.  It has remained in the epigastric and RUQ area.  Pain is exacerbated with certain PO intake.  No relieving factors.  Some fever and chills.  She has had associated nausea and vomiting.  No jaundice.  No change in BM.  No melena or hematochezia.  She does have some family history of biliary disease.  No prior hepatic disease.  She has had a recent tattoo.  No new piercing's.  No unusual travel.  History reviewed. No pertinent past medical history.  History reviewed. No pertinent past surgical history.  Family History  Problem Relation Age of Onset  . Hypertension Other   . Diabetes Other   . Anesthesia problems Other   . Malignant hyperthermia Other    Social History:  reports that she has been smoking Cigarettes.  She has been smoking about 1 pack per day. She does not have any smokeless tobacco history on file. She reports that she drinks about .6 ounces of alcohol per week. She reports that she does not use illicit drugs.  Allergies: No Known Allergies  Medications Prior to Admission  Medication Dose Route Frequency Provider Last Rate Last Dose  . 0.9 %  sodium chloride infusion   Intravenous Once Worthy Rancher, PA      . enoxaparin (LOVENOX) injection 40 mg  40 mg Subcutaneous Q12H Fabio Bering      . famotidine (PEPCID) tablet 20 mg  20 mg Oral Once Worthy Rancher, PA   20 mg at 07/28/11 2157  . gi cocktail  30 mL Oral Once Worthy Rancher, PA   30 mL at  07/28/11 2158  . HYDROmorphone (DILAUDID) injection 1 mg  1 mg Intravenous Once Worthy Rancher, PA   1 mg at 07/28/11 2112  . HYDROmorphone (DILAUDID) injection 1 mg  1 mg Intravenous Once Worthy Rancher, PA   1 mg at 07/28/11 2327  . HYDROmorphone (DILAUDID) injection 1 mg  1 mg Intravenous Once Shelda Jakes, MD   1 mg at 07/29/11 1244  . HYDROmorphone (DILAUDID) injection 1 mg  1 mg Intravenous Once Benny Lennert, MD   1 mg at 07/29/11 2313  . HYDROmorphone (DILAUDID) injection 1-2 mg  1-2 mg Intravenous Q4H PRN Fabio Bering   1 mg at 07/30/11 1723  . iohexol (OMNIPAQUE) 300 MG/ML injection 100 mL  100 mL Intravenous Once PRN Medication Radiologist   100 mL at 07/29/11 1454  . lactated ringers infusion   Intravenous Continuous Fabio Bering 110 mL/hr at 07/30/11 0315    . nitrofurantoin (MACRODANTIN) capsule 100 mg  100 mg Oral Once Worthy Rancher, PA   100 mg at 07/28/11 2325  . ondansetron (ZOFRAN) injection 4 mg  4 mg Intravenous Once Worthy Rancher, PA   4 mg at 07/28/11 2111  . ondansetron (ZOFRAN) injection 4 mg  4 mg Intravenous Once Worthy Rancher, PA   4 mg at 07/28/11 2325  . ondansetron (ZOFRAN) injection 4 mg  4 mg Intravenous Once Shelda Jakes, MD   4 mg at 07/29/11 1245  . ondansetron (ZOFRAN) injection 4 mg  4 mg Intravenous Once Benny Lennert, MD   4 mg at 07/29/11 2313  . ondansetron (ZOFRAN) injection 4 mg  4 mg Intravenous Q6H PRN Fabio Bering   4 mg at 07/30/11 1527  . pantoprazole (PROTONIX) injection 40 mg  40 mg Intravenous Q24H Fabio Bering   40 mg at 07/30/11 1610  . sodium chloride 0.9 % bolus 1,000 mL  1,000 mL Intravenous Once Benny Lennert, MD   1,000 mL at 07/29/11 2313  . sodium chloride 0.9 % injection           . DISCONTD: 0.9 %  sodium chloride infusion   Intravenous Continuous Shelda Jakes, MD 100 mL/hr at 07/29/11 1243     Medications Prior to Admission  Medication Sig Dispense Refill  . Etonogestrel (IMPLANON [place])  Inject 1 each into the skin once.        . nitrofurantoin, macrocrystal-monohydrate, (MACROBID) 100 MG capsule Take 100 mg by mouth 2 (two) times daily. Have not picked up prescriptions yet       . ondansetron (ZOFRAN) 4 MG tablet Take 4 mg by mouth every 6 (six) hours. Have not picked up prescription yet         Results for orders placed during the hospital encounter of 07/29/11 (from the past 48 hour(s))  CBC     Status: Normal   Collection Time   07/29/11 10:56 PM      Component Value Range Comment   WBC 6.3  4.0 - 10.5 (K/uL)    RBC 4.01  3.87 - 5.11 (MIL/uL)    Hemoglobin 12.4  12.0 - 15.0 (g/dL)    HCT 96.0  45.4 - 09.8 (%)    MCV 93.5  78.0 - 100.0 (fL)    MCH 30.9  26.0 - 34.0 (pg)    MCHC 33.1  30.0 - 36.0 (g/dL)    RDW 11.9  14.7 - 82.9 (%)    Platelets 237  150 - 400 (K/uL)   DIFFERENTIAL     Status: Normal   Collection Time   07/29/11 10:56 PM      Component Value Range Comment   Neutrophils Relative 58  43 - 77 (%)    Neutro Abs 3.7  1.7 - 7.7 (K/uL)    Lymphocytes Relative 35  12 - 46 (%)    Lymphs Abs 2.2  0.7 - 4.0 (K/uL)    Monocytes Relative 5  3 - 12 (%)    Monocytes Absolute 0.3  0.1 - 1.0 (K/uL)    Eosinophils Relative 1  0 - 5 (%)    Eosinophils Absolute 0.1  0.0 - 0.7 (K/uL)    Basophils Relative 1  0 - 1 (%)    Basophils Absolute 0.0  0.0 - 0.1 (K/uL)   COMPREHENSIVE METABOLIC PANEL     Status: Abnormal   Collection Time   07/29/11 10:56 PM      Component Value Range Comment   Sodium 139  135 - 145 (mEq/L)    Potassium 4.0  3.5 - 5.1 (mEq/L)    Chloride 104  96 - 112 (mEq/L)    CO2 23  19 - 32 (mEq/L)    Glucose, Bld 78  70 - 99 (mg/dL)    BUN 6  6 - 23 (mg/dL)    Creatinine, Ser 4.09  0.50 - 1.10 (mg/dL)    Calcium 8.8  8.4 - 10.5 (mg/dL)    Total Protein 6.5  6.0 - 8.3 (g/dL)    Albumin 3.6  3.5 - 5.2 (g/dL)    AST 66 (*) 0 - 37 (U/L)    ALT 74 (*) 0 - 35 (U/L)    Alkaline Phosphatase 93  39 - 117 (U/L)    Total Bilirubin 0.7  0.3 - 1.2 (mg/dL)      GFR calc non Af Amer >60  >60 (mL/min)    GFR calc Af Amer >60  >60 (mL/min)   LIPASE, BLOOD     Status: Normal   Collection Time   07/29/11 10:56 PM      Component Value Range Comment   Lipase 41  11 - 59 (U/L)   MRSA PCR SCREENING     Status: Normal   Collection Time   07/30/11  3:15 AM      Component Value Range Comment   MRSA by PCR NEGATIVE  NEGATIVE    CBC     Status: Abnormal   Collection Time   07/30/11  5:08 AM      Component Value Range Comment   WBC 6.4  4.0 - 10.5 (K/uL)    RBC 3.71 (*) 3.87 - 5.11 (MIL/uL)    Hemoglobin 11.3 (*) 12.0 - 15.0 (g/dL)    HCT 81.1 (*) 91.4 - 46.0 (%)    MCV 94.9  78.0 - 100.0 (fL)    MCH 30.5  26.0 - 34.0 (pg)    MCHC 32.1  30.0 - 36.0 (g/dL)    RDW 78.2  95.6 - 21.3 (%)    Platelets 226  150 - 400 (K/uL)   BASIC METABOLIC PANEL     Status: Abnormal   Collection Time   07/30/11  5:08 AM      Component Value Range Comment   Sodium 139  135 - 145 (mEq/L)    Potassium 4.0  3.5 - 5.1 (mEq/L)    Chloride 108  96 - 112 (mEq/L)    CO2 23  19 - 32 (mEq/L)    Glucose, Bld 100 (*) 70 - 99 (mg/dL)    BUN 6  6 - 23 (mg/dL)    Creatinine, Ser 0.86  0.50 - 1.10 (mg/dL)    Calcium 8.2 (*) 8.4 - 10.5 (mg/dL)    GFR calc non Af Amer >60  >60 (mL/min)    GFR calc Af Amer >60  >60 (mL/min)    Dg Chest 2 View  07/29/2011  *RADIOLOGY REPORT*  Clinical Data: Abdominal pain radiating to back, heartburn, smoker, shortness of breath  CHEST - 2 VIEW  Comparison: 02/13/2010  Findings: Normal heart size, mediastinal contours, and pulmonary vascularity. Lungs clear. No pleural effusion or pneumothorax. Bones unremarkable.  IMPRESSION: Normal exam.  Original Report Authenticated By: Lollie Marrow, M.D.   Ct Abdomen Pelvis W Contrast  07/29/2011  *RADIOLOGY REPORT*  Clinical Data: Upper mid abdominal pain, nausea  CT ABDOMEN AND PELVIS WITH CONTRAST  Technique:  Multidetector CT imaging of the abdomen and pelvis was performed following the standard protocol during  bolus administration of intravenous contrast.  Contrast: 100 ml Omnipaque-300 IV  Comparison: 11/06/2009  Findings: Lung bases are clear.  Liver is notable for mildly heterogeneous perfusion and mild periportal edema. Spleen, pancreas, adrenal glands are within normal  limits.  Layering gallstones (series 2/image 35).  Mild gallbladder wall edema.  Kidneys are within normal limits.  No hydronephrosis.  No evidence of bowel obstruction.  Normal appendix.  Uterus and bilateral ovaries are grossly unremarkable.  Trace pelvic fluid, likely physiologic.  Bladder is within normal limits.  Visualized osseous structures are within normal limits.  IMPRESSION: Heterogeneous enhancement of the liver with mild periportal edema. While nonspecific, this appearance is suggestive of a primary hepatic inflammatory process.  Cholelithiasis, without associated findings to suggest acute cholecystitis.  Mild gallbladder wall edema, likely secondary to hepatic process.  Original Report Authenticated By: Charline Bills, M.D.    Review of Systems  Constitutional: Positive for fever and chills. Negative for weight loss.  HENT: Negative.   Eyes: Negative.   Respiratory: Negative.   Cardiovascular: Negative.   Gastrointestinal: Positive for heartburn, nausea, vomiting and abdominal pain. Negative for diarrhea, constipation, blood in stool and melena.       As per HPI  Genitourinary: Negative.   Musculoskeletal: Negative.   Skin: Negative.   Neurological: Negative.   Endo/Heme/Allergies: Negative.   Psychiatric/Behavioral: Negative.     Blood pressure 101/65, pulse 66, temperature 97.7 F (36.5 C), temperature source Oral, resp. rate 18, height 5\' 2"  (1.575 m), weight 44.6 kg (98 lb 5.2 oz), last menstrual period 05/30/2011, SpO2 100.00%. Physical Exam  Constitutional: She is oriented to person, place, and time. She appears well-developed and well-nourished. No distress.       irritable  HENT:  Head: Normocephalic and  atraumatic.  Eyes: Conjunctivae and EOM are normal. Pupils are equal, round, and reactive to light. No scleral icterus.  Neck: Normal range of motion. Neck supple. No JVD present. No tracheal deviation present. No thyromegaly present.  Cardiovascular: Normal rate, regular rhythm, normal heart sounds and intact distal pulses.  Exam reveals no gallop and no friction rub.   No murmur heard. Respiratory: Effort normal and breath sounds normal. No respiratory distress. She has no wheezes.  GI: Soft. Bowel sounds are normal. She exhibits no distension and no mass. There is tenderness (epigastric and RUQ pain.  SOme R flank discomfort.  No peritoneal signs.  No masses.  No hernias.). There is no rebound and no guarding.  Musculoskeletal: Normal range of motion.  Lymphadenopathy:    She has no cervical adenopathy.  Neurological: She is alert and oriented to person, place, and time.  Skin: Skin is warm and dry. No rash noted. No erythema.       Several tattoos and piercing's     Assessment/Plan Abd pain.  At this point a primary GB etiology is most likely; however, given the hepatic enzymes and CT findings, a primary hepatic etiology may be possible as well.  This was discussed with the patient.  Bowel rest and IVF will be initiated.  DVT prophylaxis will also be administered.  Liver function will again be evaluated in the AM.  If the enzymes are normal, I discussed proceeding to the OR.Marland Kitchen  Risks, benefits, and alternatives were discussed.  Will admit for continued monitoring.  Yiannis Tulloch C 07/30/2011, 8:17 PM

## 2011-07-31 ENCOUNTER — Inpatient Hospital Stay (HOSPITAL_COMMUNITY): Payer: Self-pay | Admitting: Anesthesiology

## 2011-07-31 ENCOUNTER — Encounter (HOSPITAL_COMMUNITY)
Admission: EM | Disposition: A | Discharge status: Home / Self Care | Payer: Self-pay | Source: Home / Self Care | Attending: General Surgery

## 2011-07-31 ENCOUNTER — Encounter (HOSPITAL_COMMUNITY): Payer: Self-pay | Admitting: Anesthesiology

## 2011-07-31 ENCOUNTER — Encounter (HOSPITAL_COMMUNITY): Payer: Self-pay | Admitting: *Deleted

## 2011-07-31 ENCOUNTER — Other Ambulatory Visit: Payer: Self-pay | Admitting: General Surgery

## 2011-07-31 HISTORY — PX: CHOLECYSTECTOMY: SHX55

## 2011-07-31 LAB — COMPREHENSIVE METABOLIC PANEL
ALT: 54 U/L — ABNORMAL HIGH (ref 0–35)
AST: 51 U/L — ABNORMAL HIGH (ref 0–37)
Alkaline Phosphatase: 80 U/L (ref 39–117)
CO2: 22 mEq/L (ref 19–32)
GFR calc Af Amer: >60 mL/min (ref >60)
GFR calc non Af Amer: >60 mL/min (ref >60)
Glucose, Bld: 82 mg/dL (ref 70–99)
Potassium: 3.7 mEq/L (ref 3.5–5.1)
Sodium: 137 mEq/L (ref 135–145)
Total Protein: 5.8 g/dL — ABNORMAL LOW (ref 6.0–8.3)

## 2011-07-31 LAB — DIFFERENTIAL
Basophils Absolute: 0 10*3/uL (ref 0.0–0.1)
Lymphocytes Relative: 15 % (ref 12–46)
Lymphs Abs: 1.4 10*3/uL (ref 0.7–4.0)
Neutrophils Relative %: 82 % — ABNORMAL HIGH (ref 43–77)

## 2011-07-31 LAB — CBC
Platelets: 233 10*3/uL (ref 150–400)
RBC: 4.01 MIL/uL (ref 3.87–5.11)
RDW: 11.9 % (ref 11.5–15.5)
WBC: 9.3 10*3/uL (ref 4.0–10.5)

## 2011-07-31 SURGERY — LAPAROSCOPIC CHOLECYSTECTOMY
Anesthesia: General | Site: Abdomen | Wound class: Clean Contaminated

## 2011-07-31 MED ORDER — BUPIVACAINE HCL (PF) 0.5 % IJ SOLN
INTRAMUSCULAR | Status: DC | PRN
  Administered 2011-07-31: 15 mL

## 2011-07-31 MED ORDER — FENTANYL CITRATE 0.05 MG/ML IJ SOLN
25.0000 ug | INTRAMUSCULAR | Status: DC | PRN
  Administered 2011-07-31: 25 ug via INTRAVENOUS
  Administered 2011-07-31: 50 ug via INTRAVENOUS
  Administered 2011-07-31: 25 ug via INTRAVENOUS

## 2011-07-31 MED ORDER — GLYCOPYRROLATE 0.2 MG/ML IJ SOLN
INTRAMUSCULAR | Status: AC
  Filled 2011-07-31: qty 1

## 2011-07-31 MED ORDER — CEFAZOLIN SODIUM 1-5 GM-% IV SOLN
1.0000 g | INTRAVENOUS | Status: AC
  Administered 2011-07-31: 1 g via INTRAVENOUS
  Filled 2011-07-31: qty 50

## 2011-07-31 MED ORDER — ROCURONIUM BROMIDE 50 MG/5ML IV SOLN
INTRAVENOUS | Status: AC
  Filled 2011-07-31: qty 1

## 2011-07-31 MED ORDER — FENTANYL CITRATE 0.05 MG/ML IJ SOLN
INTRAMUSCULAR | Status: AC
  Filled 2011-07-31: qty 2

## 2011-07-31 MED ORDER — ONDANSETRON HCL 4 MG/2ML IJ SOLN
4.0000 mg | INTRAMUSCULAR | Status: DC | PRN
  Administered 2011-07-31 – 2011-08-01 (×3): 4 mg via INTRAVENOUS
  Filled 2011-07-31 (×2): qty 2

## 2011-07-31 MED ORDER — GLYCOPYRROLATE 0.2 MG/ML IJ SOLN
INTRAMUSCULAR | Status: DC | PRN
  Administered 2011-07-31: .2 mg via INTRAVENOUS

## 2011-07-31 MED ORDER — PROPOFOL 10 MG/ML IV EMUL
INTRAVENOUS | Status: AC
  Filled 2011-07-31: qty 20

## 2011-07-31 MED ORDER — LACTATED RINGERS IV SOLN
INTRAVENOUS | Status: AC
  Filled 2011-07-31: qty 1000

## 2011-07-31 MED ORDER — FENTANYL CITRATE 0.05 MG/ML IJ SOLN
INTRAMUSCULAR | Status: AC
  Administered 2011-07-31: 25 ug via INTRAVENOUS
  Filled 2011-07-31: qty 2

## 2011-07-31 MED ORDER — MIDAZOLAM HCL 2 MG/2ML IJ SOLN
INTRAMUSCULAR | Status: AC
  Filled 2011-07-31: qty 2

## 2011-07-31 MED ORDER — NEOSTIGMINE METHYLSULFATE 1 MG/ML IJ SOLN
INTRAMUSCULAR | Status: DC | PRN
  Administered 2011-07-31: 1 mg via INTRAMUSCULAR

## 2011-07-31 MED ORDER — ACETAMINOPHEN 325 MG PO TABS: 325.0000 mg | ORAL_TABLET | ORAL | Status: DC | PRN

## 2011-07-31 MED ORDER — PROPOFOL 10 MG/ML IV EMUL
INTRAVENOUS | Status: DC | PRN
  Administered 2011-07-31: 50 mg via INTRAVENOUS
  Administered 2011-07-31: 150 mg via INTRAVENOUS

## 2011-07-31 MED ORDER — LIDOCAINE HCL (PF) 1 % IJ SOLN
INTRAMUSCULAR | Status: AC
  Filled 2011-07-31: qty 5

## 2011-07-31 MED ORDER — ONDANSETRON HCL 4 MG/2ML IJ SOLN
4.0000 mg | Freq: Once | INTRAMUSCULAR | Status: AC
  Administered 2011-07-31: 4 mg via INTRAVENOUS

## 2011-07-31 MED ORDER — ONDANSETRON HCL 4 MG/2ML IJ SOLN: 4.0000 mg | Freq: Once | INTRAMUSCULAR | Status: DC | PRN

## 2011-07-31 MED ORDER — FENTANYL CITRATE 0.05 MG/ML IJ SOLN
INTRAMUSCULAR | Status: AC
  Administered 2011-07-31: 50 ug via INTRAVENOUS
  Filled 2011-07-31: qty 2

## 2011-07-31 MED ORDER — FENTANYL CITRATE 0.05 MG/ML IJ SOLN
INTRAMUSCULAR | Status: AC
  Filled 2011-07-31: qty 5

## 2011-07-31 MED ORDER — ONDANSETRON HCL 4 MG/2ML IJ SOLN
INTRAMUSCULAR | Status: AC
  Filled 2011-07-31: qty 2

## 2011-07-31 MED ORDER — NALOXONE HCL 0.4 MG/ML IJ SOLN
INTRAMUSCULAR | Status: AC
  Filled 2011-07-31: qty 1

## 2011-07-31 MED ORDER — GLYCOPYRROLATE 0.2 MG/ML IJ SOLN
0.2000 mg | Freq: Once | INTRAMUSCULAR | Status: AC | PRN
  Administered 2011-07-31: 0.2 mg via INTRAVENOUS

## 2011-07-31 MED ORDER — BUPIVACAINE HCL (PF) 0.5 % IJ SOLN
INTRAMUSCULAR | Status: AC
  Filled 2011-07-31: qty 30

## 2011-07-31 MED ORDER — NALOXONE HCL 0.4 MG/ML IJ SOLN
INTRAMUSCULAR | Status: DC | PRN
  Administered 2011-07-31: .1 mg via INTRAVENOUS

## 2011-07-31 MED ORDER — SUCCINYLCHOLINE CHLORIDE 20 MG/ML IJ SOLN
INTRAMUSCULAR | Status: DC | PRN
  Administered 2011-07-31: 100 mg via INTRAVENOUS

## 2011-07-31 MED ORDER — FENTANYL CITRATE 0.05 MG/ML IJ SOLN
INTRAMUSCULAR | Status: DC | PRN
  Administered 2011-07-31 (×5): 50 ug via INTRAVENOUS
  Administered 2011-07-31: 100 ug via INTRAVENOUS
  Administered 2011-07-31 (×2): 50 ug via INTRAVENOUS

## 2011-07-31 MED ORDER — SUCCINYLCHOLINE CHLORIDE 20 MG/ML IJ SOLN
INTRAMUSCULAR | Status: AC
  Filled 2011-07-31: qty 1

## 2011-07-31 MED ORDER — CEFAZOLIN SODIUM 1-5 GM-% IV SOLN
INTRAVENOUS | Status: AC
  Filled 2011-07-31: qty 50

## 2011-07-31 MED ORDER — MIDAZOLAM HCL 2 MG/2ML IJ SOLN
1.0000 mg | INTRAMUSCULAR | Status: DC | PRN
  Administered 2011-07-31 (×2): 2 mg via INTRAVENOUS

## 2011-07-31 MED ORDER — ROCURONIUM BROMIDE 100 MG/10ML IV SOLN
INTRAVENOUS | Status: DC | PRN
  Administered 2011-07-31: 12 mg via INTRAVENOUS
  Administered 2011-07-31: 8 mg via INTRAVENOUS

## 2011-07-31 MED ORDER — PANTOPRAZOLE SODIUM 40 MG PO TBEC
40.0000 mg | DELAYED_RELEASE_TABLET | Freq: Every day | ORAL | Status: DC
  Administered 2011-07-31 – 2011-08-02 (×3): 40 mg via ORAL
  Filled 2011-07-31 (×3): qty 1

## 2011-07-31 MED ORDER — HYDROCODONE-ACETAMINOPHEN 5-325 MG PO TABS
1.0000 | ORAL_TABLET | ORAL | Status: DC | PRN
  Administered 2011-07-31 (×2): 1 via ORAL
  Administered 2011-07-31 – 2011-08-02 (×5): 2 via ORAL
  Filled 2011-07-31 (×3): qty 2
  Filled 2011-07-31: qty 1
  Filled 2011-07-31 (×3): qty 2
  Filled 2011-07-31: qty 1

## 2011-07-31 MED ORDER — HYDROMORPHONE HCL 1 MG/ML IJ SOLN
1.0000 mg | INTRAMUSCULAR | Status: DC | PRN
  Administered 2011-07-31: 1 mg via INTRAVENOUS
  Administered 2011-07-31 – 2011-08-01 (×3): 2 mg via INTRAVENOUS
  Administered 2011-08-01: 1 mg via INTRAVENOUS
  Administered 2011-08-01 (×2): 2 mg via INTRAVENOUS
  Administered 2011-08-01: 1 mg via INTRAVENOUS
  Administered 2011-08-01 – 2011-08-02 (×4): 2 mg via INTRAVENOUS
  Filled 2011-07-31 (×8): qty 2
  Filled 2011-07-31: qty 1
  Filled 2011-07-31 (×2): qty 2

## 2011-07-31 MED ORDER — MIDAZOLAM HCL 2 MG/2ML IJ SOLN
INTRAMUSCULAR | Status: AC
  Administered 2011-07-31: 2 mg via INTRAVENOUS
  Filled 2011-07-31: qty 2

## 2011-07-31 SURGICAL SUPPLY — 39 items
APL SKNCLS STERI-STRIP NONHPOA (GAUZE/BANDAGES/DRESSINGS) ×1
APPLIER CLIP ROT 10 11.4 M/L (STAPLE) ×2
APR CLP MED LRG 11.4X10 (STAPLE) ×1
BAG HAMPER (MISCELLANEOUS) ×2 IMPLANT
BAG SPEC RTRVL LRG 6X4 10 (ENDOMECHANICALS) ×1
BENZOIN TINCTURE PRP APPL 2/3 (GAUZE/BANDAGES/DRESSINGS) ×2 IMPLANT
CLIP APPLIE ROT 10 11.4 M/L (STAPLE) ×1 IMPLANT
CLOSURE STERI STRIP 1/2 X4 (GAUZE/BANDAGES/DRESSINGS) ×1 IMPLANT
CLOTH BEACON ORANGE TIMEOUT ST (SAFETY) ×2 IMPLANT
COVER LIGHT HANDLE STERIS (MISCELLANEOUS) ×4 IMPLANT
DECANTER SPIKE VIAL GLASS SM (MISCELLANEOUS) ×2 IMPLANT
DEVICE TROCAR PUNCTURE CLOSURE (ENDOMECHANICALS) ×2 IMPLANT
DURAPREP 26ML APPLICATOR (WOUND CARE) ×2 IMPLANT
ELECT REM PT RETURN 9FT ADLT (ELECTROSURGICAL) ×2
ELECTRODE REM PT RTRN 9FT ADLT (ELECTROSURGICAL) ×1 IMPLANT
FILTER SMOKE EVAC LAPAROSHD (FILTER) ×2 IMPLANT
FORMALIN 10 PREFIL 120ML (MISCELLANEOUS) ×2 IMPLANT
GLOVE BIOGEL PI IND STRL 7.0 (GLOVE) IMPLANT
GLOVE BIOGEL PI IND STRL 7.5 (GLOVE) ×1 IMPLANT
GLOVE BIOGEL PI INDICATOR 7.0 (GLOVE) ×2
GLOVE BIOGEL PI INDICATOR 7.5 (GLOVE) ×1
GLOVE ECLIPSE 6.5 STRL STRAW (GLOVE) ×2 IMPLANT
GLOVE ECLIPSE 7.0 STRL STRAW (GLOVE) ×2 IMPLANT
GOWN BRE IMP SLV AUR XL STRL (GOWN DISPOSABLE) ×6 IMPLANT
HEMOSTAT SNOW SURGICEL 2X4 (HEMOSTASIS) ×2 IMPLANT
INST SET LAPROSCOPIC AP (KITS) ×2 IMPLANT
IV NS IRRIG 3000ML ARTHROMATIC (IV SOLUTION) ×2 IMPLANT
KIT ROOM TURNOVER APOR (KITS) ×2 IMPLANT
KIT TROCAR LAP CHOLE (TROCAR) ×2 IMPLANT
MANIFOLD NEPTUNE II (INSTRUMENTS) ×2 IMPLANT
PACK LAP CHOLE LZT030E (CUSTOM PROCEDURE TRAY) ×2 IMPLANT
PAD ARMBOARD 7.5X6 YLW CONV (MISCELLANEOUS) ×2 IMPLANT
POUCH SPECIMEN RETRIEVAL 10MM (ENDOMECHANICALS) ×2 IMPLANT
SET BASIN LINEN APH (SET/KITS/TRAYS/PACK) ×2 IMPLANT
SET TUBE IRRIG SUCTION NO TIP (IRRIGATION / IRRIGATOR) IMPLANT
STRIP CLOSURE SKIN 1/2X4 (GAUZE/BANDAGES/DRESSINGS) ×2 IMPLANT
SUT MNCRL AB 4-0 PS2 18 (SUTURE) ×4 IMPLANT
SUT VIC AB 2-0 CT2 27 (SUTURE) ×4 IMPLANT
WARMER LAPAROSCOPE (MISCELLANEOUS) ×2 IMPLANT

## 2011-07-31 NOTE — Anesthesia Preprocedure Evaluation (Addendum)
Anesthesia Evaluation  Name, MR# and DOB Patient awake  General Assessment Comment  Reviewed: Allergy & Precautions, H&P  and Patient's Chart, lab work & pertinent test results  History of Anesthesia Complications Negative for: history of anesthetic complications  Airway Mallampati: I TM Distance: >3 FB Neck ROM: Full    Dental No notable dental hx    Pulmonaryneg pulmonary ROS      pulmonary exam normal   Cardiovascular Regular Normal   Neuro/PsychNegative Neurological ROS Negative Psych ROS  GI/Hepatic/Renal negative Liver ROS, and negative Renal ROS (+)  GERD Poorly Controlled     Endo/Other  Negative Endocrine ROS (+)   Abdominal Normal abdominal exam  (+)   Musculoskeletal negative musculoskeletal ROS (+)  Hematology  (+) Blood dyscrasia, anemia, ,   Peds  Reproductive/Obstetrics negative OB ROS   Anesthesia Other Findings             Anesthesia Physical Anesthesia Plan  ASA: I  Anesthesia Plan: General   Post-op Pain Management:    Induction: Intravenous, Rapid sequence and Cricoid pressure planned  Airway Management Planned: Oral ETT  Additional Equipment:   Intra-op Plan:   Post-operative Plan: Extubation in OR  Informed Consent: I have reviewed the patients History and Physical, chart, labs and discussed the procedure including the risks, benefits and alternatives for the proposed anesthesia with the patient or authorized representative who has indicated his/her understanding and acceptance.     Plan Discussed with: CRNA  Anesthesia Plan Comments:         Anesthesia Quick Evaluation

## 2011-07-31 NOTE — OR Nursing (Signed)
Lovenox  Scheduled  For 1000 am ,  Per verbal order hold this dose for now.

## 2011-07-31 NOTE — Anesthesia Postprocedure Evaluation (Signed)
  Anesthesia Post-op Note  Patient: Rebekah [name]  Procedure(s) Performed:  LAPAROSCOPIC CHOLECYSTECTOMY  Patient Location: PACU  Anesthesia Type: General  Level of Consciousness: awake and alert   Airway and Oxygen Therapy: Patient Spontanous Breathing  Post-op Pain: moderate  Post-op Assessment: Post-op Vital signs reviewed, Patient's Cardiovascular Status Stable, Respiratory Function Stable and Patent Airway  Post-op Vital Signs: Reviewed and stable  Complications: No apparent anesthesia complications Anesthesia Post Note  Patient: Rebekah [name]  Procedure(s) Performed:  LAPAROSCOPIC CHOLECYSTECTOMY  Anesthesia type: General  Patient location: PACU  Post pain: Pain level controlled  Post assessment: Post-op Vital signs reviewed, Patient's Cardiovascular Status Stable, Respiratory Function Stable, Patent Airway, No signs of Nausea or vomiting and Pain level controlled  Last Vitals:  Filed Vitals:   07/31/11 1215  BP: 102/60  Pulse: 67  Temp:   Resp: 17    Post vital signs: Reviewed and stable  Level of consciousness: awake and alert   Complications: No apparent anesthesia complications

## 2011-07-31 NOTE — Transfer of Care (Addendum)
Immediate Anesthesia Transfer of Care Note  Patient: Rebekah [name] Section  Procedure(s) Performed:  LAPAROSCOPIC CHOLECYSTECTOMY  Patient Location: PACU  Anesthesia Type: General  Level of Consciousness: awake  Airway & Oxygen Therapy: Patient Spontanous Breathing and non-rebreather face mask  Post-op Assessment: Report given to PACU RN, Post -op Vital signs reviewed and stable and Patient moving all extremities  Post vital signs: Reviewed and stable  Complications: No apparent anesthesia complications

## 2011-07-31 NOTE — Op Note (Signed)
Patient:  Rebekah [name]  DOB:  [birth year]  MRN:  [id number]   Preop Diagnosis:  Acute Cholecystitis  Postop Diagnosis:  Same   Procedure:  Laparoscopic Cholecystectomy  Surgeon:  Dr. Tilford Pillar  Anes:  GETA  Indications:  20 yo female with RUQ pain whose evaluation was suggestive of acute cholecystitis. Risks benefits and alternatives of colonoscopic possible open placed acne were discussed at length the patient including but not limited to risk of bleeding, infection, bile leak, common bile duct injury, intraoperative cardiac and pulmonary events. Patient's questions and concerns were addressed the patient was consented for planned procedure  Procedure note:  Patient was taken to the operating room was placed in the supine position the operating room table at which time the  was time general anesthetic was administered. Once the patient was asleep she was endotracheally intubated by anesthesia. At this point her abdomen was prepped and draped DuraPrep solution and draped in standard fashion. A paraumbilical incision was created with an 11 blade scalpel. Additional dissection down to the subcuticular tissues carried out using a Coker clamp. This was utilized to grasp the anterior abdominal wall fascia and lift his anteriorly. A Veress needle was inserted, pneumoperitoneum was initiated, and once sufficient pneumoperitoneum was obtained an 11 mm trocar was inserted over the laparoscope allowing visualization the trocar entering into the peritoneal cavity. At this point the inner cannula was removed the laparoscope was reinserted and there is no evidence of any trocar or Veress needle placement injury. At this point the remaining trochars were placed with an 11 mm trocar in the epigastrium, a 5 mm trocar in the midline, and a 5 mm trocar in the right lateral abdominal wall. The patient was placed into a reverse Trendelenburg position left lateral decubitus position. The fundus of the gallbladder  was grasped and lifted up and over the right lobe of the liver. The peritoneal reflection off the infundibulum was bluntly stripped using a Art gallery manager. A window was created behind the cystic duct and cystic artery. 3 endoclips were placed proximally on the cystic duct one distally and the cystic duct was divided between the 2 most distal clips. Similarly the cystic artery was ligated using 2 endoclips proximally one distally and the cystic artery was divided similarly. At this point the gallbladder was removed to the gallbladder fossa using electrocautery. Once free it was placed into an Endo Catch bag and placed up and over the right lobe of the liver. Inspection of the gallbladder fossa demonstrated excellent hemostasis, and there is no evidence of any bleeding or bile leak from the previously placed endoclips. At this point attention was turned to closure.  Using Endo Close suture passing device a 2-0 Vicryl suture was passed through both the 11 mm trocar sites. With the sutures in place the gallbladder was retrieved and was removed through the umbilical trocar site and intact Endo Catch bag. It was placed in the back table and sent as a permanent specimen to pathology. At this point the pneumoperitoneum was evacuated. Trochars were removed and the Vicryl sutures were secured. Local anesthetic was instilled. A 4-0 Monocryl suture was utilized to reapproximate the skin edges at all 4 trocar sites. The skin was washed and dry with a moistened dry towel. Benzoin is applied around the incisions. Half-inch Steri-Strips are placed. At this point the drapes were removed the patient was allowed to come out of general anesthetic. She is transferred to the postanesthetic care unit in stable condition. At  the conclusion of the procedure all instrument sponge and needle counts are correct. The patient tolerated procedure extremely well.  Complications:  None  EBL:  Scant  Specimen:  Gallbladder

## 2011-07-31 NOTE — Anesthesia Procedure Notes (Addendum)
Procedure Name: Intubation Date/Time: 07/31/2011 10:33 AM Performed by: Minerva Areola Pre-anesthesia Checklist: Patient identified, Patient being monitored, Timeout performed, Emergency Drugs available and Suction available Patient Re-evaluated:Patient Re-evaluated prior to inductionOxygen Delivery Method: Circle System Utilized Preoxygenation: Pre-oxygenation with 100% oxygen Intubation Type: Rapid sequence and Circoid Pressure applied Laryngoscope Size: Miller and 2 Grade View: Grade I Tube type: Oral Tube size: 7.0 mm Number of attempts: 1 Airway Equipment and Method: stylet Placement Confirmation: ETT inserted through vocal cords under direct vision,  positive ETCO2 and breath sounds checked- equal and bilateral Secured at: 20 cm Tube secured with: Tape Dental Injury: Teeth and Oropharynx as per pre-operative assessment

## 2011-07-31 NOTE — Interval H&P Note (Signed)
Patient seen and evaluated in the pre-op holding area.  No change from H&P.  Labs reviewed.  Discussed procedure again with patient and family.  Will proceed to the OR as discussed and consented.

## 2011-07-31 NOTE — OR Nursing (Signed)
Reported care  To  [facility] barford RN

## 2011-08-01 MED ORDER — PROMETHAZINE HCL 25 MG/ML IJ SOLN
12.5000 mg | INTRAMUSCULAR | Status: DC | PRN
  Administered 2011-08-01 (×2): 12.5 mg via INTRAVENOUS
  Filled 2011-08-01 (×2): qty 1

## 2011-08-01 MED ORDER — SODIUM CHLORIDE 0.9 % IJ SOLN
INTRAMUSCULAR | Status: AC
  Filled 2011-08-01: qty 10

## 2011-08-01 NOTE — Progress Notes (Signed)
1 Day Post-Op  Subjective: Nauseated.  No emesis.  Pain fairly well controlled.   NO fevers.  AMbulating  Objective: Vital signs in last 24 hours: Temp:  [97.4 F (36.3 C)-98.6 F (37 C)] 97.4 F (36.3 C) (08/04 0959) Pulse Rate:  [71-109] 71  (08/04 0959) Resp:  [18-21] 21  (08/04 0959) BP: (93-114)/(58-72) 104/68 mmHg (08/04 0959) SpO2:  [95 %-100 %] 99 % (08/04 0959) Last BM Date: 07/30/11  Intake/Output from previous day: 08/03 0701 - 08/04 0700 In: 2320 [P.O.:720; I.V.:1600] Out: 570 [Urine:550; Blood:20] Intake/Output this shift: I/O this shift: In: 240 [P.O.:240] Out: -   General appearance: alert and no distress Resp: clear to auscultation bilaterally and unlabored GI: Soft ND, Flat, expected post-op tenderness.  Inc c/d/i  Lab Results:  @LABLAST2 (wbc:2,hgb:2,hct:2,plt:2) BMET  Basename 07/31/11 1356 07/30/11 0508  NA 137 139  K 3.7 4.0  CL 101 108  CO2 22 23  GLUCOSE 82 100*  BUN 7 6  CREATININE 0.55 0.57  CALCIUM 8.6 8.2*   PT/INR No results found for this basename: LABPROT:2,INR:2 in the last 72 hours ABG No results found for this basename: PHART:2,PCO2:2,PO2:2,HCO3:2 in the last 72 hours  Studies/Results: No results found.  Anti-infectives: Anti-infectives    None      Assessment/Plan: s/p Procedure(s): LAPAROSCOPIC CHOLECYSTECTOMY Advance diet Will continue to work on nausea.  Increase activity.  Likely home in AM.  LOS: 3 days    Shedric Fredericks C 08/01/2011

## 2011-08-01 NOTE — Anesthesia Postprocedure Evaluation (Signed)
  Anesthesia Post-op Note  Patient: Rebekah [name]  Procedure(s) Performed:  LAPAROSCOPIC CHOLECYSTECTOMY  Patient Location: room 332  Anesthesia Type: General  Level of Consciousness: awake, alert , oriented and patient cooperative  Airway and Oxygen Therapy: Patient Spontanous Breathing  Post-op Pain: 10 /10, severe  Post-op Assessment: Post-op Vital signs reviewed, Patient's Cardiovascular Status Stable, Respiratory Function Stable and NAUSEA AND VOMITING PRESENT  Post-op Vital Signs: stable  Complications: No apparent anesthesia complications

## 2011-08-01 NOTE — Progress Notes (Signed)
Dr. Leticia Penna notified of pt c/o nausea without any relief from her zofran.  I voiced to him the pt had been progressed by nursing to a Full Liquid diet, but she had some food from home.  MD is on the floor to see the patient.

## 2011-08-02 MED ORDER — HYDROCODONE-ACETAMINOPHEN 5-325 MG PO TABS: 1.0000 | ORAL_TABLET | ORAL | Status: AC | PRN

## 2011-08-02 NOTE — Discharge Summary (Signed)
Physician Discharge Summary  Patient ID: Rebekah [name] MRN: [id number] DOB/AGE: 18-Sep-1991 20 y.o.  Admit date: 07/29/2011 Discharge date: 08/02/2011  Admission Diagnoses: Acute cholecystitis  Discharge Diagnoses: Status post lap scopic cholecystectomy Active Problems:  * No active hospital problems. *    Discharged Condition: stable  Hospital Course: Patient was admitted with right upper quadrant abdominal pain. Workup was consistent for acute cholecystitis she was taken the operating room for lap scopic cholecystectomy. She tolerated this well spent a brief period postanesthetic care unit. Her initial postoperative course was complicate with some postoperative nausea this has since improved. At this point patient is tolerating a regular diet and pain is controlled oral pain medication.  Consults: none  Significant Diagnostic Studies: Patient had a CT of the abdomen and pelvis demonstrating suspicious changes for cholecystitis.  Treatments: Laparoscopic cholecystectomy  Discharge Exam: Blood pressure 100/67, pulse 81, temperature 97.4 F (36.3 C), temperature source Oral, resp. rate 20, height 5\' 2"  (1.575 m), weight 44.6 kg (98 lb 5.2 oz), last menstrual period 05/30/2011, SpO2 100.00%. General appearance: alert and no distress GI: Soft expected tenderness incision is clean dry and intact  Disposition: Left Against Medical Advice  Discharge Orders    Future Orders Please Complete By Expires   Diet - low sodium heart healthy      Increase activity slowly      Discharge instructions      Comments:   Increase activity as tolerated.   Driving Restrictions      Comments:   No driving while on pain meds   Lifting restrictions      Comments:   No lifting over 20 lbs for 4 wks.   Discharge wound care:      Comments:   Soap and water     Current Discharge Medication List    START taking these medications   Details  HYDROcodone-acetaminophen (NORCO) 5-325 MG per tablet  Take 1-2 tablets by mouth every 4 (four) hours as needed for pain. Qty: 45 tablet, Refills: 0      CONTINUE these medications which have NOT CHANGED   Details  Etonogestrel (IMPLANON ) Inject 1 each into the skin once.      nitrofurantoin, macrocrystal-monohydrate, (MACROBID) 100 MG capsule Take 100 mg by mouth 2 (two) times daily. Have not picked up prescriptions yet     ondansetron (ZOFRAN) 4 MG tablet Take 4 mg by mouth every 6 (six) hours. Have not picked up prescription yet        Follow-up Information    Follow up with Tava Peery C in 3 weeks.   Contact information:   [address] [place] [address] [phone]          Signed: Fabio Bering 08/02/2011, 2:06 PM

## 2011-08-02 NOTE — Progress Notes (Signed)
Pt discharged home with instructions, prescriptions, and carenotes.  She verbalizes understanding and voices no complaints or concerns at this time.  Pt left the floor via ambulating with staff and family in stable condition.

## 2011-08-02 NOTE — Progress Notes (Signed)
2 Days Post-Op  Subjective: Feels better.  Tolerating PO.   Objective: Vital signs in last 24 hours: Temp:  [97.4 F (36.3 [name])-99 F (37.2 [name])] 97.4 F (36.3 [name]) (08/05 0600) Pulse Rate:  [81-93] 81  (08/05 0600) Resp:  [19-20] 20  (08/05 0600) BP: (95-110)/(67-72) 100/67 mmHg (08/05 0600) SpO2:  [98 %-100 %] 100 % (08/05 0600) Last BM Date: 07/30/11  Intake/Output from previous day: 08/04 0701 - 08/05 0700 In: 1300 [P.O.:1300] Out: 1800 [Urine:1800] Intake/Output this shift:    General appearance: alert and no distress GI: soft, expected PO pain.  Inc [name]/d/i.  Lab Results:  @LABLAST2 (wbc:2,hgb:2,hct:2,plt:2) BMET  Basename 07/31/11 1356  NA 137  K 3.7  CL 101  CO2 22  GLUCOSE 82  BUN 7  CREATININE 0.55  CALCIUM 8.6   PT/INR No results found for this basename: LABPROT:2,INR:2 in the last 72 hours ABG No results found for this basename: PHART:2,PCO2:2,PO2:2,HCO3:2 in the last 72 hours  Studies/Results: No results found.  Anti-infectives: Anti-infectives    None      Assessment/Plan: s/p Procedure(s): LAPAROSCOPIC CHOLECYSTECTOMY Discharge  F/u 3 wks.  LOS: 4 days    Rebekah [name] 08/02/2011

## 2011-08-03 MED ORDER — SODIUM CHLORIDE 0.9 % IR SOLN
Status: AC | PRN
  Administered 2011-07-31: 500 mL

## 2011-08-07 ENCOUNTER — Encounter (HOSPITAL_COMMUNITY): Payer: Self-pay | Admitting: General Surgery

## 2011-12-15 ENCOUNTER — Encounter (HOSPITAL_COMMUNITY): Payer: Self-pay | Admitting: Emergency Medicine

## 2011-12-15 ENCOUNTER — Emergency Department (HOSPITAL_COMMUNITY)
Admission: EM | Admit: 2011-12-15 | Discharge: 2011-12-15 | Disposition: A | Discharge status: Home / Self Care | Payer: Self-pay | Attending: Emergency Medicine | Admitting: Emergency Medicine

## 2011-12-15 DIAGNOSIS — IMO0001 Reserved for inherently not codable concepts without codable children: Secondary | ICD-10-CM | POA: Insufficient documentation

## 2011-12-15 DIAGNOSIS — R509 Fever, unspecified: Secondary | ICD-10-CM | POA: Insufficient documentation

## 2011-12-15 DIAGNOSIS — J029 Acute pharyngitis, unspecified: Secondary | ICD-10-CM | POA: Insufficient documentation

## 2011-12-15 DIAGNOSIS — R51 Headache: Secondary | ICD-10-CM | POA: Insufficient documentation

## 2011-12-15 MED ORDER — PENICILLIN V POTASSIUM 500 MG PO TABS: 500.0000 mg | ORAL_TABLET | Freq: Four times a day (QID) | ORAL | Status: AC

## 2011-12-15 MED ORDER — PENICILLIN V POTASSIUM 250 MG PO TABS
500.0000 mg | ORAL_TABLET | Freq: Once | ORAL | Status: AC
  Administered 2011-12-15: 500 mg via ORAL
  Filled 2011-12-15: qty 2

## 2011-12-15 NOTE — ED Provider Notes (Signed)
History     CSN: [id number] Arrival date & time: 12/15/2011  8:06 PM   First MD Initiated Contact with Patient 12/15/11 2021      Chief Complaint  Patient presents with  . Sore Throat  . Generalized Body Aches    (Consider location/radiation/quality/duration/timing/severity/associated sxs/prior treatment) Patient is a 20 y.o. female presenting with pharyngitis. The history is provided by the patient. No language interpreter was used.  Sore Throat This is a new problem. Episode onset: several days ago. The problem occurs constantly. The problem has been gradually worsening. Associated symptoms include chills, fatigue, a fever, headaches and myalgias. The symptoms are aggravated by swallowing and coughing.    History reviewed. No pertinent past medical history.  Past Surgical History  Procedure Date  . Cholecystectomy 07/31/2011    Procedure: LAPAROSCOPIC CHOLECYSTECTOMY;  Surgeon: Fabio Bering;  Location: [facility];  Service: General;  Laterality: N/A;    Family History  Problem Relation Age of Onset  . Hypertension Other   . Diabetes Other   . Anesthesia problems Other   . Malignant hyperthermia Other     History  Substance Use Topics  . Smoking status: Current Everyday Smoker -- 1.0 packs/day    Types: Cigarettes  . Smokeless tobacco: Not on file  . Alcohol Use: 0.6 oz/week    1 Cans of beer per week     saturday    OB History    Grav Para Term Preterm Abortions TAB SAB Ect Mult Living                  Review of Systems  Constitutional: Positive for fever, chills and fatigue.  HENT: Positive for trouble swallowing. Negative for neck stiffness.   Musculoskeletal: Positive for myalgias.  Neurological: Positive for headaches.  All other systems reviewed and are negative.    Allergies  Review of patient's allergies indicates no known allergies.  Home Medications   Current Outpatient Rx  Name Route Sig Dispense Refill  . IMPLANON [place] Subcutaneous Inject 1  each into the skin once.        BP 103/62  Pulse 116  Temp(Src) 98.5 F (36.9 C) (Oral)  Resp 16  Ht 5\' 2"  (1.575 m)  Wt 91 lb (41.277 kg)  BMI 16.64 kg/m2  SpO2 100%  Physical Exam  Nursing note and vitals reviewed. Constitutional: She is oriented to person, place, and time. She appears well-developed and well-nourished. No distress.  HENT:  Head: Normocephalic and atraumatic. No trismus in the jaw.  Right Ear: External ear normal.  Left Ear: External ear normal.  Nose: Nose normal.  Mouth/Throat: Uvula is midline and mucous membranes are normal. No uvula swelling. Oropharyngeal exudate and posterior oropharyngeal erythema present. No posterior oropharyngeal edema or tonsillar abscesses.  Eyes: EOM are normal.  Neck: Normal range of motion.  Cardiovascular: Normal rate, regular rhythm and normal heart sounds.   Pulmonary/Chest: Effort normal and breath sounds normal.  Abdominal: Soft. She exhibits no distension. There is no tenderness.  Musculoskeletal: Normal range of motion.  Neurological: She is alert and oriented to person, place, and time.  Skin: Skin is warm and dry.  Psychiatric: She has a normal mood and affect. Judgment normal.    ED Course  Procedures (including critical care time)   Labs Reviewed  RAPID STREP SCREEN   No results found.   No diagnosis found.    MDM          Worthy Rancher, PA 12/15/11 2118

## 2011-12-15 NOTE — ED Notes (Signed)
Pt with sore throat for 2 days ago, throat red swollen

## 2011-12-15 NOTE — ED Notes (Signed)
Patient complaining of sore throat x 2 days. States she was sick last week but is better; however her throat started hurting 2 days ago. Denies vomiting at this time.

## 2011-12-16 NOTE — ED Provider Notes (Signed)
Medical screening examination/treatment/procedure(s) were performed by non-physician practitioner and as supervising physician I was immediately available for consultation/collaboration.  Wylie Coon S. Tyger Wichman, MD 12/16/11 1253 

## 2012-02-12 ENCOUNTER — Encounter (HOSPITAL_COMMUNITY): Payer: Self-pay

## 2012-02-12 ENCOUNTER — Emergency Department (HOSPITAL_COMMUNITY)
Admission: EM | Admit: 2012-02-12 | Discharge: 2012-02-12 | Disposition: A | Discharge status: Home / Self Care | Payer: Self-pay | Attending: Emergency Medicine | Admitting: Emergency Medicine

## 2012-02-12 DIAGNOSIS — X500XXA Overexertion from strenuous movement or load, initial encounter: Secondary | ICD-10-CM | POA: Insufficient documentation

## 2012-02-12 DIAGNOSIS — S39012A Strain of muscle, fascia and tendon of lower back, initial encounter: Secondary | ICD-10-CM

## 2012-02-12 DIAGNOSIS — S335XXA Sprain of ligaments of lumbar spine, initial encounter: Secondary | ICD-10-CM | POA: Insufficient documentation

## 2012-02-12 DIAGNOSIS — F17200 Nicotine dependence, unspecified, uncomplicated: Secondary | ICD-10-CM | POA: Insufficient documentation

## 2012-02-12 MED ORDER — IBUPROFEN 400 MG PO TABS
600.0000 mg | ORAL_TABLET | Freq: Once | ORAL | Status: AC
  Administered 2012-02-12: 600 mg via ORAL
  Filled 2012-02-12: qty 2

## 2012-02-12 MED ORDER — HYDROCODONE-ACETAMINOPHEN 5-325 MG PO TABS
1.0000 | ORAL_TABLET | Freq: Once | ORAL | Status: AC
  Administered 2012-02-12: 1 via ORAL
  Filled 2012-02-12: qty 1

## 2012-02-12 MED ORDER — HYDROCODONE-ACETAMINOPHEN 5-325 MG PO TABS: 1.0000 | ORAL_TABLET | Freq: Four times a day (QID) | ORAL | Status: AC | PRN

## 2012-02-12 NOTE — ED Provider Notes (Signed)
History     CSN: [id number]  Arrival date & time 02/12/12  1344   First MD Initiated Contact with Patient 02/12/12 1432      Chief Complaint  Patient presents with  . Back Pain    (Consider location/radiation/quality/duration/timing/severity/associated sxs/prior treatment) Patient is a 21 y.o. female presenting with back pain. The history is provided by the patient. No language interpreter was used.  Back Pain  This is a new problem. The current episode started more than 2 days ago. The problem occurs constantly. The problem has not changed since onset.Associated with: bending over and picking up her 17 lb child. The pain is present in the lumbar spine. The quality of the pain is described as aching. The pain does not radiate. The pain is at a severity of 7/10. The symptoms are aggravated by bending. She has tried NSAIDs and heat for the symptoms. The treatment provided no relief.    History reviewed. No pertinent past medical history.  Past Surgical History  Procedure Date  . Cholecystectomy 07/31/2011    Procedure: LAPAROSCOPIC CHOLECYSTECTOMY;  Surgeon: Fabio Bering;  Location: [facility];  Service: General;  Laterality: N/A;    Family History  Problem Relation Age of Onset  . Hypertension Other   . Diabetes Other   . Anesthesia problems Other   . Malignant hyperthermia Other     History  Substance Use Topics  . Smoking status: Current Everyday Smoker -- 1.0 packs/day    Types: Cigarettes  . Smokeless tobacco: Not on file  . Alcohol Use: 0.6 oz/week    1 Cans of beer per week     saturday    OB History    Grav Para Term Preterm Abortions TAB SAB Ect Mult Living                  Review of Systems  Musculoskeletal: Positive for back pain.  All other systems reviewed and are negative.    Allergies  Review of patient's allergies indicates no known allergies.  Home Medications   Current Outpatient Rx  Name Route Sig Dispense Refill  . IMPLANON [place]  Subcutaneous Inject 1 each into the skin once.        BP 120/59  Pulse 97  Temp(Src) 98.1 F (36.7 C) (Oral)  Resp 20  Ht 5\' 1"  (1.549 m)  Wt 91 lb (41.277 kg)  BMI 17.19 kg/m2  SpO2 100%  Physical Exam  Nursing note and vitals reviewed. Constitutional: She is oriented to person, place, and time. She appears well-developed and well-nourished. No distress.  HENT:  Head: Normocephalic and atraumatic.  Eyes: EOM are normal.  Neck: Normal range of motion.  Cardiovascular: Normal rate, regular rhythm and normal heart sounds.   Pulmonary/Chest: Effort normal and breath sounds normal.  Abdominal: Soft. She exhibits no distension. There is no tenderness.  Musculoskeletal: She exhibits tenderness.       Lumbar back: She exhibits tenderness and bony tenderness.       Back:  Neurological: She is alert and oriented to person, place, and time. GCS eye subscore is 4. GCS verbal subscore is 5. GCS motor subscore is 6.  Reflex Scores:      Patellar reflexes are 1+ on the right side and 2+ on the left side.      Achilles reflexes are 1+ on the right side and 2+ on the left side. Skin: Skin is warm and dry. She is not diaphoretic.  Psychiatric: She has a normal mood  and affect. Judgment normal.    ED Course  Procedures (including critical care time)  Labs Reviewed - No data to display No results found.   No diagnosis found.    MDM          Worthy Rancher, PA 02/12/12 [phone]

## 2012-02-12 NOTE — ED Notes (Signed)
Pt c/o lower back pain after picking up her daughter who weighs approx 17lbs,  Incident happened about 3 days ago, pt states that the pain is non radiating,

## 2012-02-12 NOTE — Discharge Instructions (Signed)
Back Pain, Adult Low back pain is very common. About 1 in 5 people have back pain.The cause of low back pain is rarely dangerous. The pain often gets better over time.About half of people with a sudden onset of back pain feel better in just 2 weeks. About 8 in 10 people feel better by 6 weeks.  CAUSES Some common causes of back pain include:  Strain of the muscles or ligaments supporting the spine.   Wear and tear (degeneration) of the spinal discs.   Arthritis.   Direct injury to the back.  DIAGNOSIS Most of the time, the direct cause of low back pain is not known.However, back pain can be treated effectively even when the exact cause of the pain is unknown.Answering your caregiver's questions about your overall health and symptoms is one of the most accurate ways to make sure the cause of your pain is not dangerous. If your caregiver needs more information, he or she may order lab work or imaging tests (X-rays or MRIs).However, even if imaging tests show changes in your back, this usually does not require surgery. HOME CARE INSTRUCTIONS For many people, back pain returns.Since low back pain is rarely dangerous, it is often a condition that people can learn to manageon their own.   Remain active. It is stressful on the back to sit or stand in one place. Do not sit, drive, or stand in one place for more than 30 minutes at a time. Take short walks on level surfaces as soon as pain allows.Try to increase the length of time you walk each day.   Do not stay in bed.Resting more than 1 or 2 days can delay your recovery.   Do not avoid exercise or work.Your body is made to move.It is not dangerous to be active, even though your back may hurt.Your back will likely heal faster if you return to being active before your pain is gone.   Pay attention to your body when you bend and lift. Many people have less discomfortwhen lifting if they bend their knees, keep the load close to their  bodies,and avoid twisting. Often, the most comfortable positions are those that put less stress on your recovering back.   Find a comfortable position to sleep. Use a firm mattress and lie on your side with your knees slightly bent. If you lie on your back, put a pillow under your knees.   Only take over-the-counter or prescription medicines as directed by your caregiver. Over-the-counter medicines to reduce pain and inflammation are often the most helpful.Your caregiver may prescribe muscle relaxant drugs.These medicines help dull your pain so you can more quickly return to your normal activities and healthy exercise.   Put ice on the injured area.   Put ice in a plastic bag.   Place a towel between your skin and the bag.   Leave the ice on for 15 to 20 minutes, 3 to 4 times a day for the first 2 to 3 days. After that, ice and heat may be alternated to reduce pain and spasms.   Ask your caregiver about trying back exercises and gentle massage. This may be of some benefit.   Avoid feeling anxious or stressed.Stress increases muscle tension and can worsen back pain.It is important to recognize when you are anxious or stressed and learn ways to manage it.Exercise is a great option.  SEEK MEDICAL CARE IF:  You have pain that is not relieved with rest or medicine.   You have   pain that does not improve in 1 week.   You have new symptoms.   You are generally not feeling well.  SEEK IMMEDIATE MEDICAL CARE IF:   You have pain that radiates from your back into your legs.   You develop new bowel or bladder control problems.   You have unusual weakness or numbness in your arms or legs.   You develop nausea or vomiting.   You develop abdominal pain.   You feel faint.  Document Released: 12/14/2005 Document Revised: 08/26/2011 Document Reviewed: 05/04/2011 [facility] Patient Information 2012 [place], [place].   Take the pain medicine as directed.  Take ibuprofen up to 600 mg every 8  hrs with food.  Follow up with dr.harrison as needed.

## 2012-02-12 NOTE — ED Notes (Signed)
Pt presents with low back pain x 3 days. Pt states she lifted her daughter and started having pain.

## 2012-02-13 NOTE — ED Provider Notes (Signed)
Evaluation and management procedures were performed by the PA/NP under my supervision/collaboration.   Felisa Bonier, MD 02/13/12 [phone]

## 2012-03-11 ENCOUNTER — Emergency Department (HOSPITAL_COMMUNITY)
Admission: EM | Admit: 2012-03-11 | Discharge: 2012-03-12 | Disposition: A | Discharge status: Home / Self Care | Payer: Self-pay | Attending: Emergency Medicine | Admitting: Emergency Medicine

## 2012-03-11 ENCOUNTER — Encounter (HOSPITAL_COMMUNITY): Payer: Self-pay

## 2012-03-11 ENCOUNTER — Emergency Department (HOSPITAL_COMMUNITY): Payer: Self-pay

## 2012-03-11 DIAGNOSIS — M549 Dorsalgia, unspecified: Secondary | ICD-10-CM

## 2012-03-11 DIAGNOSIS — M545 Low back pain, unspecified: Secondary | ICD-10-CM | POA: Insufficient documentation

## 2012-03-11 LAB — POCT PREGNANCY, URINE: Preg Test, Ur: NEGATIVE

## 2012-03-11 MED ORDER — HYDROCODONE-ACETAMINOPHEN 5-325 MG PO TABS
1.0000 | ORAL_TABLET | Freq: Once | ORAL | Status: AC
  Administered 2012-03-11: 1 via ORAL
  Filled 2012-03-11: qty 1

## 2012-03-11 NOTE — ED Notes (Signed)
Pt presents with low back pain x 15 months. Pt denies injury.

## 2012-03-12 ENCOUNTER — Encounter (HOSPITAL_COMMUNITY): Payer: Self-pay | Admitting: Emergency Medicine

## 2012-03-12 MED ORDER — METHOCARBAMOL 500 MG PO TABS: 500.0000 mg | ORAL_TABLET | Freq: Three times a day (TID) | ORAL | Status: AC

## 2012-03-12 MED ORDER — HYDROCODONE-ACETAMINOPHEN 5-325 MG PO TABS: ORAL_TABLET | ORAL | Status: AC

## 2012-03-12 NOTE — Discharge Instructions (Signed)

## 2012-03-12 NOTE — ED Provider Notes (Signed)
History     CSN: [id number]  Arrival date & time 03/11/12  2217   First MD Initiated Contact with Patient 03/11/12 2245      Chief Complaint  Patient presents with  . Back Pain    (Consider location/radiation/quality/duration/timing/severity/associated sxs/prior treatment) HPI Comments: Patient complains of a worsening pain to her lower back for several months. She states the pain has increased since the birth of her child approximately 15 months ago. States pain is persistent and worse with certain movements and improves somewhat with rest. She denies any radiation of pain, numbness or weakness to her extremities, incontinence of urine or feces, dysuria, or abdominal pain. She states that she was seen here last month for the same pain improved slightly with medication but never resolved. She has not followed up with anyone regarding this pain.  Patient is a 21 y.o. female presenting with back pain. The history is provided by the patient. No language interpreter was used.  Back Pain  This is a chronic problem. The current episode started more than 1 week ago. The problem occurs constantly. The problem has been gradually worsening. Associated with: Pain began in her lower back after the birth of her child approximately 15 months ago. The pain is present in the lumbar spine. The quality of the pain is described as aching. The pain does not radiate. The pain is the same all the time. Pertinent negatives include no chest pain, no fever, no numbness, no abdominal pain, no abdominal swelling, no bowel incontinence, no perianal numbness, no bladder incontinence, no dysuria, no pelvic pain, no leg pain, no paresthesias, no paresis, no tingling and no weakness. She has tried NSAIDs and analgesics for the symptoms. The treatment provided mild relief.    History reviewed. No pertinent past medical history.  Past Surgical History  Procedure Date  . Cholecystectomy 07/31/2011    Procedure: LAPAROSCOPIC  CHOLECYSTECTOMY;  Surgeon: Fabio Bering;  Location: [facility];  Service: General;  Laterality: N/A;    Family History  Problem Relation Age of Onset  . Hypertension Other   . Diabetes Other   . Anesthesia problems Other   . Malignant hyperthermia Other     History  Substance Use Topics  . Smoking status: Current Everyday Smoker -- 1.0 packs/day    Types: Cigarettes  . Smokeless tobacco: Not on file  . Alcohol Use: 0.6 oz/week    1 Cans of beer per week     saturday    OB History    Grav Para Term Preterm Abortions TAB SAB Ect Mult Living                  Review of Systems  Constitutional: Negative for fever.  Cardiovascular: Negative for chest pain.  Gastrointestinal: Negative for nausea, vomiting, abdominal pain and bowel incontinence.  Genitourinary: Negative for bladder incontinence, dysuria, hematuria, flank pain, vaginal bleeding, difficulty urinating, vaginal pain and pelvic pain.  Musculoskeletal: Positive for back pain.  Skin: Negative.   Neurological: Negative for dizziness, tingling, weakness, numbness and paresthesias.  All other systems reviewed and are negative.    Allergies  Review of patient's allergies indicates no known allergies.  Home Medications   Current Outpatient Rx  Name Route Sig Dispense Refill  . IMPLANON [place] Subcutaneous Inject 1 each into the skin once.        BP 108/69  Pulse 92  Temp(Src) 97.9 F (36.6 C) (Oral)  Resp 16  Ht 5\' 2"  (1.575 m)  Wt  95 lb (43.092 kg)  BMI 17.38 kg/m2  SpO2 100%  Physical Exam  Nursing note and vitals reviewed. Constitutional: She is oriented to person, place, and time. She appears well-developed and well-nourished. No distress.  HENT:  Head: Normocephalic and atraumatic.  Neck: Normal range of motion. Neck supple.  Cardiovascular: Normal rate, regular rhythm, normal heart sounds and intact distal pulses.   No murmur heard. Pulmonary/Chest: Effort normal and breath sounds normal. No  respiratory distress.  Abdominal: Soft. She exhibits no distension. There is no tenderness.  Musculoskeletal: Normal range of motion. She exhibits tenderness. She exhibits no edema.       Lumbar back: She exhibits tenderness, bony tenderness and pain. She exhibits normal range of motion, no swelling, no edema, no deformity, no laceration and normal pulse.       Back:       Localized tenderness to palpation over the area of the sacrum. No bruising, edema, or wound. No tenderness of the coccyx.  Neurological: She is alert and oriented to person, place, and time. No cranial nerve deficit or sensory deficit. She exhibits normal muscle tone. Coordination and gait normal.  Reflex Scores:      Patellar reflexes are 2+ on the right side and 2+ on the left side.      Achilles reflexes are 2+ on the right side and 2+ on the left side. Skin: Skin is warm and dry.    ED Course  Procedures (including critical care time)  Results for orders placed during the hospital encounter of 03/11/12  POCT PREGNANCY, URINE      Component Value Range   Preg Test, Ur NEGATIVE  NEGATIVE     Dg Lumbar Spine Complete  03/12/2012  *RADIOLOGY REPORT*  Clinical Data: Intermittent pain of the low back.  LUMBAR SPINE - COMPLETE 4+ VIEW  Comparison: Abdomen pelvis CT August 2012  Findings: Cholecystectomy clips.  Nonobstructive bowel gas pattern. No radiopaque urinary tract calculus identified.  Lumbar spine vertebral bodies are normal in height.  Normal alignment.  No significant degenerative change or acute bony abnormality.  No evidence of pars defect.  Sacroiliac joints appear within normal limits.  Pubic symphysis unremarkable.  IMPRESSION: A negative.  Original Report Authenticated By: Britta Mccreedy, M.D.       MDM     Patient has tenderness to palpation over the area of the sacrum. There's no obvious edema or discoloration.  No focal neuro deficits on exam. Patient ambulates without difficulty, gait is steady. Pain  is likely musculoskeletal. I doubt spinal abscess.  Patient was seen here one month ago for similar symptoms. She states that pain has not improved I will order an x-ray this evening   I have reviewed patient's previous ED chart and nursing notes.  Patient / Family / Caregiver understand and agree with initial ED impression and plan with expectations set for ED visit. Pt stable in ED with no significant deterioration in condition. Pt feels improved after observation and/or treatment in ED.  c   Medical screening examination/treatment/procedure(s) were performed by non-physician practitioner and as supervising physician I was immediately available for consultation/collaboration. Osvaldo Human, M.D.   Tammy L. [place], [place] 03/12/12 0024  Carleene Cooper III, MD 03/12/12 1247

## 2012-06-14 ENCOUNTER — Emergency Department (HOSPITAL_COMMUNITY)
Admission: EM | Admit: 2012-06-14 | Discharge: 2012-06-14 | Disposition: A | Discharge status: Home / Self Care | Payer: Self-pay | Attending: Emergency Medicine | Admitting: Emergency Medicine

## 2012-06-14 ENCOUNTER — Encounter (HOSPITAL_COMMUNITY): Payer: Self-pay | Admitting: Emergency Medicine

## 2012-06-14 DIAGNOSIS — F17200 Nicotine dependence, unspecified, uncomplicated: Secondary | ICD-10-CM | POA: Insufficient documentation

## 2012-06-14 DIAGNOSIS — K029 Dental caries, unspecified: Secondary | ICD-10-CM | POA: Insufficient documentation

## 2012-06-14 DIAGNOSIS — K0889 Other specified disorders of teeth and supporting structures: Secondary | ICD-10-CM

## 2012-06-14 MED ORDER — HYDROCODONE-ACETAMINOPHEN 5-325 MG PO TABS: ORAL_TABLET | ORAL | Status: DC

## 2012-06-14 MED ORDER — PENICILLIN V POTASSIUM 500 MG PO TABS: 500.0000 mg | ORAL_TABLET | Freq: Four times a day (QID) | ORAL | Status: AC

## 2012-06-14 NOTE — Discharge Instructions (Signed)
Dental Pain  A tooth ache may be caused by cavities (tooth decay). Cavities expose the nerve of the tooth to air and hot or cold temperatures. It may come from an infection or abscess (also called a boil or furuncle) around your tooth. It is also often caused by dental caries (tooth decay). This causes the pain you are having.  DIAGNOSIS   Your caregiver can diagnose this problem by exam.  TREATMENT   · If caused by an infection, it may be treated with medications which kill germs (antibiotics) and pain medications as prescribed by your caregiver. Take medications as directed.  · Only take over-the-counter or prescription medicines for pain, discomfort, or fever as directed by your caregiver.  · Whether the tooth ache today is caused by infection or dental disease, you should see your dentist as soon as possible for further care.  SEEK MEDICAL CARE IF:  The exam and treatment you received today has been provided on an emergency basis only. This is not a substitute for complete medical or dental care. If your problem worsens or new problems (symptoms) appear, and you are unable to meet with your dentist, call or return to this location.  SEEK IMMEDIATE MEDICAL CARE IF:   · You have a fever.  · You develop redness and swelling of your face, jaw, or neck.  · You are unable to open your mouth.  · You have severe pain uncontrolled by pain medicine.  MAKE SURE YOU:   · Understand these instructions.  · Will watch your condition.  · Will get help right away if you are not doing well or get worse.  Document Released: 12/14/2005 Document Revised: 12/03/2011 Document Reviewed: 08/01/2008  [employer]® Patient Information ©2012 [employer].

## 2012-06-14 NOTE — ED Notes (Signed)
Patient complaining of upper left sided tooth pain x 2 weeks worsening today.

## 2012-06-14 NOTE — ED Provider Notes (Signed)
History     CSN: [id number]  Arrival date & time 06/14/12  2002   First MD Initiated Contact with Patient 06/14/12 2022      Chief Complaint  Patient presents with  . Dental Pain    (Consider location/radiation/quality/duration/timing/severity/associated sxs/prior treatment) Patient is a 21 y.o. female presenting with tooth pain. The history is provided by the patient.  Dental PainThe primary symptoms include mouth pain. Primary symptoms do not include dental injury, oral bleeding, headaches, fever, shortness of breath, sore throat, angioedema or cough. The symptoms began more than 1 week ago. The symptoms are worsening. The symptoms are new. The symptoms occur constantly.  Mouth pain began more than 1 week ago. Mouth pain occurs constantly. Mouth pain is worsening. Affected locations include: teeth and gum(s).  Additional symptoms include: dental sensitivity to temperature and gum tenderness. Additional symptoms do not include: gum swelling, trismus, jaw pain, facial swelling, trouble swallowing, pain with swallowing, drooling and ear pain. Medical issues include: smoking and periodontal disease.    History reviewed. No pertinent past medical history.  Past Surgical History  Procedure Date  . Cholecystectomy 07/31/2011    Procedure: LAPAROSCOPIC CHOLECYSTECTOMY;  Surgeon: Fabio Bering;  Location: [facility];  Service: General;  Laterality: N/A;    Family History  Problem Relation Age of Onset  . Hypertension Other   . Diabetes Other   . Anesthesia problems Other   . Malignant hyperthermia Other     History  Substance Use Topics  . Smoking status: Current Everyday Smoker -- 1.0 packs/day    Types: Cigarettes  . Smokeless tobacco: Not on file  . Alcohol Use: 0.6 oz/week    1 Cans of beer per week     saturday    OB History    Grav Para Term Preterm Abortions TAB SAB Ect Mult Living                  Review of Systems  Constitutional: Negative for fever and appetite  change.  HENT: Positive for dental problem. Negative for ear pain, congestion, sore throat, facial swelling, drooling, trouble swallowing, neck pain and neck stiffness.   Eyes: Negative for pain and visual disturbance.  Respiratory: Negative for cough and shortness of breath.   Neurological: Negative for dizziness, facial asymmetry and headaches.  Hematological: Negative for adenopathy.  All other systems reviewed and are negative.    Allergies  Review of patient's allergies indicates no known allergies.  Home Medications   Current Outpatient Rx  Name Route Sig Dispense Refill  . IMPLANON [place] Subcutaneous Inject 1 each into the skin once.        BP 99/60  Pulse 88  Temp 98.5 F (36.9 C) (Oral)  Resp 24  Ht 5\' 2"  (1.575 m)  Wt 100 lb (45.36 kg)  BMI 18.29 kg/m2  SpO2 100%  Physical Exam  Nursing note and vitals reviewed. Constitutional: She is oriented to person, place, and time. She appears well-developed and well-nourished. No distress.  HENT:  Head: Normocephalic and atraumatic. No trismus in the jaw.  Mouth/Throat: Uvula is midline, oropharynx is clear and moist and mucous membranes are normal. Dental caries present. No dental abscesses or uvula swelling.    Neck: Normal range of motion. Neck supple.  Cardiovascular: Normal rate, regular rhythm and normal heart sounds.   No murmur heard. Pulmonary/Chest: Effort normal and breath sounds normal.  Musculoskeletal: Normal range of motion.  Lymphadenopathy:    She has no cervical adenopathy.  Neurological:  She is alert and oriented to person, place, and time. She exhibits normal muscle tone. Coordination normal.  Skin: Skin is warm and dry.    ED Course  Procedures (including critical care time)  Labs Reviewed - No data to display      MDM   Prescribed:  Pen VK Norco #20    Patient / Family / Caregiver understand and agree with initial ED impression and plan with expectations set for ED visit. Pt stable  in ED with no significant deterioration in condition. Pt feels improved after observation and/or treatment in ED.      Clydia Nieves L. [place], [place] 06/14/12 2112

## 2012-06-14 NOTE — ED Provider Notes (Signed)
Medical screening examination/treatment/procedure(s) were performed by non-physician practitioner and as supervising physician I was immediately available for consultation/collaboration.   Shelda Jakes, MD 06/14/12 2113

## 2012-06-23 ENCOUNTER — Emergency Department (HOSPITAL_COMMUNITY)
Admission: EM | Admit: 2012-06-23 | Discharge: 2012-06-23 | Disposition: A | Discharge status: Home / Self Care | Payer: Self-pay | Attending: Emergency Medicine | Admitting: Emergency Medicine

## 2012-06-23 ENCOUNTER — Encounter (HOSPITAL_COMMUNITY): Payer: Self-pay | Admitting: *Deleted

## 2012-06-23 DIAGNOSIS — K089 Disorder of teeth and supporting structures, unspecified: Secondary | ICD-10-CM | POA: Insufficient documentation

## 2012-06-23 DIAGNOSIS — F17200 Nicotine dependence, unspecified, uncomplicated: Secondary | ICD-10-CM | POA: Insufficient documentation

## 2012-06-23 DIAGNOSIS — K0889 Other specified disorders of teeth and supporting structures: Secondary | ICD-10-CM

## 2012-06-23 MED ORDER — HYDROCODONE-ACETAMINOPHEN 5-325 MG PO TABS: ORAL_TABLET | ORAL | Status: AC

## 2012-06-23 MED ORDER — IBUPROFEN 800 MG PO TABS
800.0000 mg | ORAL_TABLET | Freq: Once | ORAL | Status: AC
  Administered 2012-06-23: 800 mg via ORAL
  Filled 2012-06-23: qty 1

## 2012-06-23 NOTE — ED Notes (Signed)
Pt c/o toothache to right lower wisdom tooth area. Pt states that the tooth has been hurting for a month, was seen in er 06/14/2012,  given antibiotics and pain medicine, states that she saw her dentist and then was sent to another dentist in [place] who advised her that she needed to have 300.00 in order for him to be able to remove the tooth, pt states that she is not able to afford the payment needed to have the tooth removed and she was not given anything beside another prescription for antibiotics yesterday.

## 2012-06-23 NOTE — Discharge Instructions (Signed)
Dental Pain  A tooth ache may be caused by cavities (tooth decay). Cavities expose the nerve of the tooth to air and hot or cold temperatures. It may come from an infection or abscess (also called a boil or furuncle) around your tooth. It is also often caused by dental caries (tooth decay). This causes the pain you are having.  DIAGNOSIS   Your caregiver can diagnose this problem by exam.  TREATMENT   · If caused by an infection, it may be treated with medications which kill germs (antibiotics) and pain medications as prescribed by your caregiver. Take medications as directed.  · Only take over-the-counter or prescription medicines for pain, discomfort, or fever as directed by your caregiver.  · Whether the tooth ache today is caused by infection or dental disease, you should see your dentist as soon as possible for further care.  SEEK MEDICAL CARE IF:  The exam and treatment you received today has been provided on an emergency basis only. This is not a substitute for complete medical or dental care. If your problem worsens or new problems (symptoms) appear, and you are unable to meet with your dentist, call or return to this location.  SEEK IMMEDIATE MEDICAL CARE IF:   · You have a fever.  · You develop redness and swelling of your face, jaw, or neck.  · You are unable to open your mouth.  · You have severe pain uncontrolled by pain medicine.  MAKE SURE YOU:   · Understand these instructions.  · Will watch your condition.  · Will get help right away if you are not doing well or get worse.  Document Released: 12/14/2005 Document Revised: 12/03/2011 Document Reviewed: 08/01/2008  [employer]® Patient Information ©2012 [employer].

## 2012-06-23 NOTE — ED Notes (Signed)
Pt c/o left lower toothache x 3-4 weeks. Pt states that she was seen by her dentist and is unable to afford to have the tooth cut out.

## 2012-06-27 NOTE — ED Provider Notes (Signed)
History     CSN: [id number]  Arrival date & time 06/23/12  1453   First MD Initiated Contact with Patient 06/23/12 1517      Chief Complaint  Patient presents with  . Dental Pain    (Consider location/radiation/quality/duration/timing/severity/associated sxs/prior treatment) Patient is a 21 y.o. female presenting with tooth pain. The history is provided by the patient.  Dental PainThe primary symptoms include mouth pain. Primary symptoms do not include headaches, fever, shortness of breath, sore throat, angioedema or cough. The symptoms began more than 1 week ago. The symptoms are unchanged. The symptoms are new. The symptoms occur constantly.  Mouth pain occurs constantly. Mouth pain is unchanged. Affected locations include: teeth and gum(s).  Additional symptoms include: dental sensitivity to temperature and gum tenderness. Additional symptoms do not include: gum swelling, purulent gums, trismus, jaw pain, facial swelling, trouble swallowing, drooling and swollen glands. Medical issues include: smoking and periodontal disease.    History reviewed. No pertinent past medical history.  Past Surgical History  Procedure Date  . Cholecystectomy 07/31/2011    Procedure: LAPAROSCOPIC CHOLECYSTECTOMY;  Surgeon: Fabio Bering;  Location: [facility];  Service: General;  Laterality: N/A;    Family History  Problem Relation Age of Onset  . Hypertension Other   . Diabetes Other   . Anesthesia problems Other   . Malignant hyperthermia Other     History  Substance Use Topics  . Smoking status: Current Everyday Smoker -- 1.0 packs/day    Types: Cigarettes  . Smokeless tobacco: Not on file  . Alcohol Use: 0.6 oz/week    1 Cans of beer per week     saturday    OB History    Grav Para Term Preterm Abortions TAB SAB Ect Mult Living                  Review of Systems  Constitutional: Negative for fever and appetite change.  HENT: Positive for dental problem. Negative for congestion,  sore throat, facial swelling, drooling, trouble swallowing, neck pain and neck stiffness.   Eyes: Negative for pain and visual disturbance.  Respiratory: Negative for cough and shortness of breath.   Neurological: Negative for dizziness, facial asymmetry and headaches.  Hematological: Negative for adenopathy.  All other systems reviewed and are negative.    Allergies  Review of patient's allergies indicates no known allergies.  Home Medications   Current Outpatient Rx  Name Route Sig Dispense Refill  . IMPLANON [place] Subcutaneous Inject 1 each into the skin once.      Marland Kitchen HYDROCODONE-ACETAMINOPHEN 5-325 MG PO TABS  Take one-two tabs po q 4-6 hrs prn pain 12 tablet 0    BP 111/64  Pulse 101  Temp 98.4 F (36.9 C) (Oral)  Resp 18  Ht 5\' 2"  (1.575 m)  Wt 100 lb (45.36 kg)  BMI 18.29 kg/m2  SpO2 100%  Physical Exam  Nursing note and vitals reviewed. Constitutional: She is oriented to person, place, and time. She appears well-developed and well-nourished. No distress.  HENT:  Head: Normocephalic and atraumatic. No trismus in the jaw.  Mouth/Throat: Uvula is midline, oropharynx is clear and moist and mucous membranes are normal. Dental caries present. No dental abscesses or uvula swelling.  Neck: Normal range of motion. Neck supple.  Cardiovascular: Normal rate, regular rhythm and normal heart sounds.   No murmur heard. Pulmonary/Chest: Effort normal and breath sounds normal.  Musculoskeletal: Normal range of motion.  Lymphadenopathy:    She has no cervical  adenopathy.  Neurological: She is alert and oriented to person, place, and time. She exhibits normal muscle tone. Coordination normal.  Skin: Skin is warm and dry.    ED Course  Procedures (including critical care time)  Labs Reviewed - No data to display   1. Pain, dental       MDM    Patient with hx of dental disease and dental caries.  No obvious dental abscess, no facial swelling or trismus.  Seen here  previously for same.  Currently taking amoxil   Patient / Family / Caregiver understand and agree with initial ED impression and plan with expectations set for ED visit. Pt stable in ED with no significant deterioration in condition. Pt feels improved after observation and/or treatment in ED.     Prescribed:  norco #12   Mikelle Myrick L. [place], [place] 06/27/12 1752

## 2012-06-28 NOTE — ED Provider Notes (Signed)
Medical screening examination/treatment/procedure(s) were performed by non-physician practitioner and as supervising physician I was immediately available for consultation/collaboration.   Jennel Mara, MD 06/28/12 0701 

## 2012-12-26 ENCOUNTER — Encounter (HOSPITAL_COMMUNITY): Payer: Self-pay

## 2012-12-26 ENCOUNTER — Emergency Department (HOSPITAL_COMMUNITY)
Admission: EM | Admit: 2012-12-26 | Discharge: 2012-12-26 | Discharge status: Against Medical Advice | Payer: Self-pay | Attending: Emergency Medicine | Admitting: Emergency Medicine

## 2012-12-26 DIAGNOSIS — R109 Unspecified abdominal pain: Secondary | ICD-10-CM | POA: Insufficient documentation

## 2012-12-26 NOTE — ED Notes (Signed)
Pt called to room no answer  

## 2012-12-26 NOTE — ED Notes (Signed)
Pt called to room- no answer x3 

## 2012-12-26 NOTE — ED Notes (Signed)
Pt c/o abdominal pain for 4 days and vaginal bleeding that started today.

## 2013-01-02 ENCOUNTER — Emergency Department (HOSPITAL_COMMUNITY)
Admission: EM | Admit: 2013-01-02 | Discharge: 2013-01-02 | Disposition: A | Discharge status: Home / Self Care | Payer: Self-pay | Attending: Emergency Medicine | Admitting: Emergency Medicine

## 2013-01-02 ENCOUNTER — Encounter (HOSPITAL_COMMUNITY): Payer: Self-pay | Admitting: Emergency Medicine

## 2013-01-02 DIAGNOSIS — Z79899 Other long term (current) drug therapy: Secondary | ICD-10-CM | POA: Insufficient documentation

## 2013-01-02 DIAGNOSIS — K0889 Other specified disorders of teeth and supporting structures: Secondary | ICD-10-CM

## 2013-01-02 DIAGNOSIS — K089 Disorder of teeth and supporting structures, unspecified: Secondary | ICD-10-CM | POA: Insufficient documentation

## 2013-01-02 DIAGNOSIS — F17200 Nicotine dependence, unspecified, uncomplicated: Secondary | ICD-10-CM | POA: Insufficient documentation

## 2013-01-02 MED ORDER — HYDROCODONE-ACETAMINOPHEN 5-325 MG PO TABS: 1.0000 | ORAL_TABLET | Freq: Four times a day (QID) | ORAL | Status: AC | PRN

## 2013-01-02 MED ORDER — PENICILLIN V POTASSIUM 500 MG PO TABS: 500.0000 mg | ORAL_TABLET | Freq: Four times a day (QID) | ORAL | Status: AC

## 2013-01-02 NOTE — ED Notes (Signed)
Pain lt lower molar.

## 2013-01-02 NOTE — ED Provider Notes (Signed)
History     CSN: [id number]  Arrival date & time 01/02/13  2026   First MD Initiated Contact with Patient 01/02/13 2100      Chief Complaint  Patient presents with  . Dental Pain    (Consider location/radiation/quality/duration/timing/severity/associated sxs/prior treatment) HPI Comments: Plans to see a dentist in [place] ASAP  Patient is a 22 y.o. female presenting with tooth pain. The history is provided by the patient. No language interpreter was used.  Dental PainThe primary symptoms include mouth pain. Primary symptoms do not include dental injury or fever. Episode onset: several days. The symptoms are unchanged. The symptoms occur constantly.  Additional symptoms include: jaw pain. Additional symptoms do not include: trismus and facial swelling.    History reviewed. No pertinent past medical history.  Past Surgical History  Procedure Date  . Cholecystectomy 07/31/2011    Procedure: LAPAROSCOPIC CHOLECYSTECTOMY;  Surgeon: Fabio Bering;  Location: [facility];  Service: General;  Laterality: N/A;    Family History  Problem Relation Age of Onset  . Hypertension Other   . Diabetes Other   . Anesthesia problems Other   . Malignant hyperthermia Other     History  Substance Use Topics  . Smoking status: Current Every Day Smoker -- 1.0 packs/day    Types: Cigarettes  . Smokeless tobacco: Not on file  . Alcohol Use: 0.6 oz/week    1 Cans of beer per week     Comment: saturday    OB History    Grav Para Term Preterm Abortions TAB SAB Ect Mult Living                  Review of Systems  Constitutional: Negative for fever and chills.  HENT: Positive for dental problem. Negative for facial swelling.   Cardiovascular: Negative for chest pain.  All other systems reviewed and are negative.    Allergies  Review of patient's allergies indicates no known allergies.  Home Medications   Current Outpatient Rx  Name  Route  Sig  Dispense  Refill  . IMPLANON   Subcutaneous   Inject 1 each into the skin once.           Marland Kitchen HYDROCODONE-ACETAMINOPHEN 5-325 MG PO TABS   Oral   Take 1 tablet by mouth every 6 (six) hours as needed for pain.   20 tablet   0   . PENICILLIN V POTASSIUM 500 MG PO TABS   Oral   Take 1 tablet (500 mg total) by mouth 4 (four) times daily.   40 tablet   0     BP 93/62  Pulse 101  Temp 98 F (36.7 C) (Oral)  Resp 20  Ht 5\' 2"  (1.575 m)  Wt 100 lb (45.36 kg)  BMI 18.29 kg/m2  SpO2 100%  Physical Exam  Nursing note and vitals reviewed. Constitutional: She is oriented to person, place, and time. She appears well-developed and well-nourished. No distress.  HENT:  Head: Normocephalic and atraumatic.  Mouth/Throat: Uvula is midline, oropharynx is clear and moist and mucous membranes are normal. No uvula swelling.    Eyes: EOM are normal.  Neck: Normal range of motion.  Cardiovascular: Normal rate and regular rhythm.   Pulmonary/Chest: Effort normal.  Abdominal: Soft. She exhibits no distension. There is no tenderness.  Musculoskeletal: Normal range of motion.  Neurological: She is alert and oriented to person, place, and time.  Skin: Skin is warm and dry.  Psychiatric: She has a normal mood and affect.  Judgment normal.    ED Course  Procedures (including critical care time)  Labs Reviewed - No data to display No results found.   1. Pain, dental       MDM  prob impacted wisdom tooth.  rx-hyerocodone, 20 rx-pen VK 500 mg, 40 Ibuprofen F/u with dentist ASAP        Evalina Field, PA 01/02/13 2107  Evalina Field, PA 01/02/13 2129

## 2013-01-02 NOTE — ED Notes (Signed)
Patient complaining of dental pain since last night.

## 2013-01-03 NOTE — ED Provider Notes (Signed)
Medical screening examination/treatment/procedure(s) were performed by non-physician practitioner and as supervising physician I was immediately available for consultation/collaboration.   Lyanne Co, MD 01/03/13 2120

## 2013-01-19 ENCOUNTER — Encounter (HOSPITAL_COMMUNITY): Payer: Self-pay | Admitting: Emergency Medicine

## 2013-01-19 ENCOUNTER — Emergency Department (HOSPITAL_COMMUNITY)
Admission: EM | Admit: 2013-01-19 | Discharge: 2013-01-19 | Disposition: A | Discharge status: Home / Self Care | Payer: Self-pay | Attending: Emergency Medicine | Admitting: Emergency Medicine

## 2013-01-19 DIAGNOSIS — N83209 Unspecified ovarian cyst, unspecified side: Secondary | ICD-10-CM

## 2013-01-19 DIAGNOSIS — N949 Unspecified condition associated with female genital organs and menstrual cycle: Secondary | ICD-10-CM | POA: Insufficient documentation

## 2013-01-19 DIAGNOSIS — R102 Pelvic and perineal pain: Secondary | ICD-10-CM

## 2013-01-19 DIAGNOSIS — F17200 Nicotine dependence, unspecified, uncomplicated: Secondary | ICD-10-CM | POA: Insufficient documentation

## 2013-01-19 DIAGNOSIS — R109 Unspecified abdominal pain: Secondary | ICD-10-CM | POA: Insufficient documentation

## 2013-01-19 DIAGNOSIS — Z9089 Acquired absence of other organs: Secondary | ICD-10-CM | POA: Insufficient documentation

## 2013-01-19 HISTORY — DX: Unspecified ovarian cyst, unspecified side: N83.209

## 2013-01-19 LAB — WET PREP, GENITAL
Trich, Wet Prep: NONE SEEN
Yeast Wet Prep HPF POC: NONE SEEN

## 2013-01-19 MED ORDER — KETOROLAC TROMETHAMINE 10 MG PO TABS
10.0000 mg | ORAL_TABLET | Freq: Once | ORAL | Status: AC
  Administered 2013-01-19: 10 mg via ORAL
  Filled 2013-01-19: qty 1

## 2013-01-19 MED ORDER — MELOXICAM 7.5 MG PO TABS: ORAL_TABLET | ORAL | Status: DC

## 2013-01-19 MED ORDER — ONDANSETRON HCL 4 MG PO TABS
4.0000 mg | ORAL_TABLET | Freq: Once | ORAL | Status: AC
  Administered 2013-01-19: 4 mg via ORAL
  Filled 2013-01-19: qty 1

## 2013-01-19 MED ORDER — HYDROCODONE-ACETAMINOPHEN 5-325 MG PO TABS
1.0000 | ORAL_TABLET | Freq: Once | ORAL | Status: AC
  Administered 2013-01-19: 1 via ORAL
  Filled 2013-01-19: qty 1

## 2013-01-19 MED ORDER — HYDROCODONE-ACETAMINOPHEN 5-325 MG PO TABS: 1.0000 | ORAL_TABLET | ORAL | Status: AC | PRN

## 2013-01-19 NOTE — ED Notes (Signed)
PA at bedside at this time.  

## 2013-01-19 NOTE — ED Notes (Signed)
Pt c/o lower abd/pelvic pain from ovarian cysts. Denies bleeding. Reports dark (brownish) vaginal d/c.

## 2013-01-19 NOTE — ED Provider Notes (Signed)
History     CSN: [id number]  Arrival date & time 01/19/13  9518   First MD Initiated Contact with Patient 01/19/13 1033      Chief Complaint  Patient presents with  . Pelvic Pain    (Consider location/radiation/quality/duration/timing/severity/associated sxs/prior treatment) Patient is a 22 y.o. female presenting with pelvic pain. The history is provided by the patient.  Pelvic Pain This is a recurrent problem. The current episode started in the past 7 days. The problem occurs daily. The problem has been gradually worsening. Associated symptoms include abdominal pain. Pertinent negatives include no arthralgias, chest pain, coughing, fever or neck pain. Nothing aggravates the symptoms. She has tried NSAIDs for the symptoms. The treatment provided no relief.    Past Medical History  Diagnosis Date  . Ovarian cyst     Past Surgical History  Procedure Date  . Cholecystectomy 07/31/2011    Procedure: LAPAROSCOPIC CHOLECYSTECTOMY;  Surgeon: Fabio Bering;  Location: [facility];  Service: General;  Laterality: N/A;    Family History  Problem Relation Age of Onset  . Hypertension Other   . Diabetes Other   . Anesthesia problems Other   . Malignant hyperthermia Other     History  Substance Use Topics  . Smoking status: Current Every Day Smoker -- 1.0 packs/day    Types: Cigarettes  . Smokeless tobacco: Not on file  . Alcohol Use: 0.6 oz/week    1 Cans of beer per week     Comment: occasional    OB History    Grav Para Term Preterm Abortions TAB SAB Ect Mult Living                  Review of Systems  Constitutional: Negative for fever and activity change.       All ROS Neg except as noted in HPI  HENT: Negative for nosebleeds and neck pain.   Eyes: Negative for photophobia and discharge.  Respiratory: Negative for cough, shortness of breath and wheezing.   Cardiovascular: Negative for chest pain and palpitations.  Gastrointestinal: Positive for abdominal pain. Negative  for blood in stool.  Genitourinary: Positive for pelvic pain. Negative for dysuria, frequency and hematuria.  Musculoskeletal: Negative for back pain and arthralgias.  Skin: Negative.   Neurological: Negative for dizziness, seizures and speech difficulty.  Psychiatric/Behavioral: Negative for hallucinations and confusion.    Allergies  Review of patient's allergies indicates no known allergies.  Home Medications   Current Outpatient Rx  Name  Route  Sig  Dispense  Refill  . IBUPROFEN 600 MG PO TABS   Oral   Take 600 mg by mouth every 6 (six) hours as needed. Pain         . IMPLANON [place]   Subcutaneous   Inject 1 each into the skin once.             BP 117/53  Pulse 92  Temp 98.1 F (36.7 C)  Resp 18  Ht 5\' 2"  (1.575 m)  Wt 100 lb (45.36 kg)  BMI 18.29 kg/m2  SpO2 100%  Physical Exam  Nursing note and vitals reviewed. Constitutional: She is oriented to person, place, and time. She appears well-developed and well-nourished.  Non-toxic appearance.  HENT:  Head: Normocephalic.  Right Ear: Tympanic membrane and external ear normal.  Left Ear: Tympanic membrane and external ear normal.  Eyes: EOM and lids are normal. Pupils are equal, round, and reactive to light.  Neck: Normal range of motion. Neck supple. Carotid  bruit is not present.  Cardiovascular: Normal rate, regular rhythm, normal heart sounds, intact distal pulses and normal pulses.   Pulmonary/Chest: Breath sounds normal. No respiratory distress.  Abdominal: Soft. Bowel sounds are normal. There is no tenderness. There is no guarding.       No CVA tenderness. Mild lower abdomen soreness.  Genitourinary:       Chaperone present during pelvic examination. The external structures are well within normal limits. The patient is shaven. There is no mass in the vaginal vault. There is mild white discharge in the vaginal vault. There is no adnexal tenderness. There is no foreign body in the vaginal vault. There is no pain  to movement of the cervix.  Musculoskeletal: Normal range of motion.  Lymphadenopathy:       Head (right side): No submandibular adenopathy present.       Head (left side): No submandibular adenopathy present.    She has no cervical adenopathy.  Neurological: She is alert and oriented to person, place, and time. She has normal strength. No cranial nerve deficit or sensory deficit.  Skin: Skin is warm and dry.  Psychiatric: She has a normal mood and affect. Her speech is normal.    ED Course  Procedures (including critical care time)  Labs Reviewed - No data to display No results found.   No diagnosis found.    MDM  I have reviewed nursing notes, vital signs, and all appropriate lab and imaging results for this patient. Patient has history of lower abdomen and pelvic area pain for the past 2448 hours. She states that she has been taking ibuprofen but this is not helping. She was seen approximately 4 months ago for a similar episode at a different hospital and was told she had ovarian cyst by ultrasound. The patient has not been to see a GYN specialist concerning this because of financial situations. Vital signs are found to be within normal limits. No acute changes noted on examination. The patient is treated with Mobic and Norco for pain. Patient strongly advised to see her GYN physician concerning these ovarian cyst and for additional evaluation of the lower abdomen and pelvic area pain.       Kathie Dike, [place] 01/20/13 [phone]

## 2013-01-22 NOTE — ED Provider Notes (Signed)
Medical screening examination/treatment/procedure(s) were performed by non-physician practitioner and as supervising physician I was immediately available for consultation/collaboration.   Hannibal Skalla L Nakeshia Waldeck, MD 01/22/13 1517 

## 2013-03-23 ENCOUNTER — Emergency Department (HOSPITAL_COMMUNITY)
Admission: EM | Admit: 2013-03-23 | Discharge: 2013-03-23 | Disposition: A | Discharge status: Home / Self Care | Payer: Self-pay | Attending: Emergency Medicine | Admitting: Emergency Medicine

## 2013-03-23 ENCOUNTER — Encounter (HOSPITAL_COMMUNITY): Payer: Self-pay | Admitting: Emergency Medicine

## 2013-03-23 DIAGNOSIS — Z8742 Personal history of other diseases of the female genital tract: Secondary | ICD-10-CM | POA: Insufficient documentation

## 2013-03-23 DIAGNOSIS — Z79899 Other long term (current) drug therapy: Secondary | ICD-10-CM | POA: Insufficient documentation

## 2013-03-23 DIAGNOSIS — K089 Disorder of teeth and supporting structures, unspecified: Secondary | ICD-10-CM | POA: Insufficient documentation

## 2013-03-23 DIAGNOSIS — R220 Localized swelling, mass and lump, head: Secondary | ICD-10-CM | POA: Insufficient documentation

## 2013-03-23 DIAGNOSIS — R6884 Jaw pain: Secondary | ICD-10-CM | POA: Insufficient documentation

## 2013-03-23 DIAGNOSIS — F17200 Nicotine dependence, unspecified, uncomplicated: Secondary | ICD-10-CM | POA: Insufficient documentation

## 2013-03-23 DIAGNOSIS — K029 Dental caries, unspecified: Secondary | ICD-10-CM | POA: Insufficient documentation

## 2013-03-23 DIAGNOSIS — K0889 Other specified disorders of teeth and supporting structures: Secondary | ICD-10-CM

## 2013-03-23 MED ORDER — MELOXICAM 7.5 MG PO TABS: ORAL_TABLET | ORAL | Status: DC

## 2013-03-23 MED ORDER — ACETAMINOPHEN-CODEINE #3 300-30 MG PO TABS: 1.0000 | ORAL_TABLET | Freq: Four times a day (QID) | ORAL | Status: DC | PRN

## 2013-03-23 MED ORDER — AMOXICILLIN 500 MG PO CAPS: 500.0000 mg | ORAL_CAPSULE | Freq: Three times a day (TID) | ORAL | Status: DC

## 2013-03-23 NOTE — ED Notes (Signed)
Pt c/o left upper toothache from broken tooth.

## 2013-03-23 NOTE — ED Provider Notes (Signed)
Medical screening examination/treatment/procedure(s) were performed by non-physician practitioner and as supervising physician I was immediately available for consultation/collaboration.  Harli Engelken L Shenay Torti, MD 03/23/13 1615 

## 2013-03-23 NOTE — ED Provider Notes (Signed)
History     CSN: [id number]  Arrival date & time 03/23/13  1032   First MD Initiated Contact with Patient 03/23/13 1152      Chief Complaint  Patient presents with  . Dental Pain    (Consider location/radiation/quality/duration/timing/severity/associated sxs/prior treatment) Patient is a 22 y.o. female presenting with tooth pain. The history is provided by the patient.  Dental PainThe primary symptoms include mouth pain. Primary symptoms do not include shortness of breath or cough. The symptoms began 2 days ago. The symptoms are worsening. The symptoms occur constantly.  Additional symptoms include: gum swelling and jaw pain. Additional symptoms do not include: trouble swallowing and nosebleeds. Medical issues include: smoking.    Past Medical History  Diagnosis Date  . Ovarian cyst     Past Surgical History  Procedure Laterality Date  . Cholecystectomy  07/31/2011    Procedure: LAPAROSCOPIC CHOLECYSTECTOMY;  Surgeon: Fabio Bering;  Location: [facility];  Service: General;  Laterality: N/A;    Family History  Problem Relation Age of Onset  . Hypertension Other   . Diabetes Other   . Anesthesia problems Other   . Malignant hyperthermia Other     History  Substance Use Topics  . Smoking status: Current Every Day Smoker -- 1.00 packs/day    Types: Cigarettes  . Smokeless tobacco: Not on file  . Alcohol Use: 0.6 oz/week    1 Cans of beer per week     Comment: occasional    OB History   Grav Para Term Preterm Abortions TAB SAB Ect Mult Living                  Review of Systems  Constitutional: Negative for activity change.       All ROS Neg except as noted in HPI  HENT: Positive for dental problem. Negative for nosebleeds, trouble swallowing and neck pain.   Eyes: Negative for photophobia and discharge.  Respiratory: Negative for cough, shortness of breath and wheezing.   Cardiovascular: Negative for chest pain and palpitations.  Gastrointestinal: Negative for  abdominal pain and blood in stool.  Genitourinary: Negative for dysuria, frequency and hematuria.  Musculoskeletal: Negative for back pain and arthralgias.  Skin: Negative.   Neurological: Negative for dizziness, seizures and speech difficulty.  Psychiatric/Behavioral: Negative for hallucinations and confusion.    Allergies  Review of patient's allergies indicates no known allergies.  Home Medications   Current Outpatient Rx  Name  Route  Sig  Dispense  Refill  . Etonogestrel (IMPLANON [place])   Subcutaneous   Inject 1 each into the skin once.           Marland Kitchen ibuprofen (ADVIL,MOTRIN) 200 MG tablet   Oral   Take 800 mg by mouth every 6 (six) hours as needed for pain.           BP 103/56  Pulse 113  Temp(Src) 98.1 F (36.7 C)  Resp 16  Ht 5\' 2"  (1.575 m)  Wt 100 lb (45.36 kg)  BMI 18.29 kg/m2  SpO2 100%  Physical Exam  Nursing note and vitals reviewed. Constitutional: She is oriented to person, place, and time. She appears well-developed and well-nourished.  Non-toxic appearance.  HENT:  Head: Normocephalic.  Right Ear: Tympanic membrane and external ear normal.  Left Ear: Tympanic membrane and external ear normal.  Cavity of the left upper 1st molar with broken area. No visible abscess. Airway patent. No swelling under the tongue.  Eyes: EOM and lids are normal.  Pupils are equal, round, and reactive to light.  Neck: Normal range of motion. Neck supple. Carotid bruit is not present.  Cardiovascular: Normal rate, regular rhythm, normal heart sounds, intact distal pulses and normal pulses.   Pulmonary/Chest: Breath sounds normal. No respiratory distress.  Abdominal: Soft. Bowel sounds are normal. There is no tenderness. There is no guarding.  Musculoskeletal: Normal range of motion.  Lymphadenopathy:       Head (right side): No submandibular adenopathy present.       Head (left side): No submandibular adenopathy present.    She has no cervical adenopathy.  Neurological:  She is alert and oriented to person, place, and time. She has normal strength. No cranial nerve deficit or sensory deficit.  Skin: Skin is warm and dry.  Psychiatric: She has a normal mood and affect. Her speech is normal.    ED Course  Procedures (including critical care time)  Labs Reviewed - No data to display No results found.   No diagnosis found.    MDM  I have reviewed nursing notes, vital signs, and all appropriate lab and imaging results for this patient. Pt presents to ED with toothache related to a broken decayed tooth for 2 to 3 days. No fever reported. No problem with swallowing or speaking. Plan - Dental resources given to the patient. Rx for amoxil and mobic. And tylenol with cod #3.       Kathie Dike, PA-C 03/23/13 1318

## 2013-04-27 ENCOUNTER — Encounter (HOSPITAL_COMMUNITY): Payer: Self-pay | Admitting: *Deleted

## 2013-04-27 ENCOUNTER — Emergency Department (HOSPITAL_COMMUNITY)
Admission: EM | Admit: 2013-04-27 | Discharge: 2013-04-27 | Disposition: A | Discharge status: Home / Self Care | Payer: Self-pay | Attending: Emergency Medicine | Admitting: Emergency Medicine

## 2013-04-27 DIAGNOSIS — Z3202 Encounter for pregnancy test, result negative: Secondary | ICD-10-CM | POA: Insufficient documentation

## 2013-04-27 DIAGNOSIS — R059 Cough, unspecified: Secondary | ICD-10-CM | POA: Insufficient documentation

## 2013-04-27 DIAGNOSIS — R05 Cough: Secondary | ICD-10-CM | POA: Insufficient documentation

## 2013-04-27 DIAGNOSIS — R102 Pelvic and perineal pain: Secondary | ICD-10-CM

## 2013-04-27 DIAGNOSIS — N949 Unspecified condition associated with female genital organs and menstrual cycle: Secondary | ICD-10-CM | POA: Insufficient documentation

## 2013-04-27 DIAGNOSIS — A5901 Trichomonal vulvovaginitis: Secondary | ICD-10-CM | POA: Insufficient documentation

## 2013-04-27 DIAGNOSIS — N898 Other specified noninflammatory disorders of vagina: Secondary | ICD-10-CM | POA: Insufficient documentation

## 2013-04-27 DIAGNOSIS — R110 Nausea: Secondary | ICD-10-CM | POA: Insufficient documentation

## 2013-04-27 DIAGNOSIS — N390 Urinary tract infection, site not specified: Secondary | ICD-10-CM | POA: Insufficient documentation

## 2013-04-27 DIAGNOSIS — Z8742 Personal history of other diseases of the female genital tract: Secondary | ICD-10-CM | POA: Insufficient documentation

## 2013-04-27 DIAGNOSIS — F17200 Nicotine dependence, unspecified, uncomplicated: Secondary | ICD-10-CM | POA: Insufficient documentation

## 2013-04-27 LAB — CBC WITH DIFFERENTIAL/PLATELET
Basophils Relative: 0 % (ref 0–1)
Eosinophils Relative: 2 % (ref 0–5)
HCT: 37.1 % (ref 36.0–46.0)
Hemoglobin: 12.7 g/dL (ref 12.0–15.0)
MCH: 31.1 pg (ref 26.0–34.0)
MCHC: 34.2 g/dL (ref 30.0–36.0)
MCV: 90.9 fL (ref 78.0–100.0)
Monocytes Absolute: 0.4 10*3/uL (ref 0.1–1.0)
Monocytes Relative: 6 % (ref 3–12)
Neutro Abs: 4.6 10*3/uL (ref 1.7–7.7)

## 2013-04-27 LAB — URINALYSIS, ROUTINE W REFLEX MICROSCOPIC
Glucose, UA: NEGATIVE mg/dL
Ketones, ur: NEGATIVE mg/dL

## 2013-04-27 LAB — BASIC METABOLIC PANEL
BUN: 9 mg/dL (ref 6–23)
Chloride: 105 mEq/L (ref 96–112)
Creatinine, Ser: 0.59 mg/dL (ref 0.50–1.10)
GFR calc Af Amer: >90 mL/min (ref >90)

## 2013-04-27 LAB — WET PREP, GENITAL

## 2013-04-27 LAB — URINE MICROSCOPIC-ADD ON

## 2013-04-27 MED ORDER — CEFTRIAXONE SODIUM 250 MG IJ SOLR
250.0000 mg | Freq: Once | INTRAMUSCULAR | Status: AC
  Administered 2013-04-27: 250 mg via INTRAMUSCULAR
  Filled 2013-04-27: qty 250

## 2013-04-27 MED ORDER — IBUPROFEN 400 MG PO TABS
400.0000 mg | ORAL_TABLET | Freq: Once | ORAL | Status: AC
  Administered 2013-04-27: 400 mg via ORAL
  Filled 2013-04-27: qty 1

## 2013-04-27 MED ORDER — KETOROLAC TROMETHAMINE 60 MG/2ML IM SOLN
30.0000 mg | Freq: Once | INTRAMUSCULAR | Status: DC
  Filled 2013-04-27: qty 2

## 2013-04-27 MED ORDER — HYDROCODONE-ACETAMINOPHEN 5-325 MG PO TABS: 1.0000 | ORAL_TABLET | ORAL | Status: DC | PRN

## 2013-04-27 MED ORDER — LIDOCAINE HCL (PF) 1 % IJ SOLN
INTRAMUSCULAR | Status: AC
  Administered 2013-04-27: 5 mL
  Filled 2013-04-27: qty 5

## 2013-04-27 MED ORDER — AZITHROMYCIN 250 MG PO TABS
1000.0000 mg | ORAL_TABLET | Freq: Once | ORAL | Status: AC
  Administered 2013-04-27: 1000 mg via ORAL
  Filled 2013-04-27: qty 4

## 2013-04-27 MED ORDER — METRONIDAZOLE 500 MG PO TABS: 500.0000 mg | ORAL_TABLET | Freq: Two times a day (BID) | ORAL | Status: DC

## 2013-04-27 MED ORDER — HYDROCODONE-ACETAMINOPHEN 5-325 MG PO TABS
1.0000 | ORAL_TABLET | Freq: Once | ORAL | Status: AC
  Administered 2013-04-27: 1 via ORAL
  Filled 2013-04-27: qty 1

## 2013-04-27 NOTE — ED Provider Notes (Signed)
Medical screening examination/treatment/procedure(s) were performed by non-physician practitioner and as supervising physician I was immediately available for consultation/collaboration.  Shelda Jakes, MD 04/27/13 2035

## 2013-04-27 NOTE — ED Provider Notes (Signed)
History     CSN: [id number]  Arrival date & time 04/27/13  1701   First MD Initiated Contact with Patient 04/27/13 1756      Chief Complaint  Patient presents with  . Abdominal Pain    (Consider location/radiation/quality/duration/timing/severity/associated sxs/prior treatment) Patient is a 22 y.o. female presenting with abdominal pain. The history is provided by the patient.  Abdominal Pain Pain location:  LLQ and RLQ Pain quality: cramping and sharp   Pain radiates to:  Does not radiate Pain severity:  Moderate Onset quality:  Gradual Duration:  1 day Timing:  Intermittent Progression:  Worsening Chronicity:  New Relieved by:  Nothing Worsened by:  Nothing tried Ineffective treatments:  NSAIDs and acetaminophen Associated symptoms: cough, nausea, vaginal bleeding and vaginal discharge   Associated symptoms: no chills, no constipation, no dysuria, no fever, no shortness of breath and no vomiting    Rebekah [name] is a 22 y.o. female who presents to the ED with abdominal pain. The pain started yesterday. She describes the pain as cramping. She rates the pain as 8/10. Last pap smear 2 years ago and was normal. Current sex partner x 3 years. No history of STI's . She does have a history of ovarian cysts. Implant for birth control.   Past Medical History  Diagnosis Date  . Ovarian cyst     Past Surgical History  Procedure Laterality Date  . Cholecystectomy  07/31/2011    Procedure: LAPAROSCOPIC CHOLECYSTECTOMY;  Surgeon: Fabio Bering;  Location: [facility];  Service: General;  Laterality: N/A;    Family History  Problem Relation Age of Onset  . Hypertension Other   . Diabetes Other   . Anesthesia problems Other   . Malignant hyperthermia Other     History  Substance Use Topics  . Smoking status: Current Every Day Smoker -- 1.00 packs/day    Types: Cigarettes  . Smokeless tobacco: Not on file  . Alcohol Use: 0.6 oz/week    1 Cans of beer per week     Comment:  occasional    OB History   Grav Para Term Preterm Abortions TAB SAB Ect Mult Living                  Review of Systems  Constitutional: Negative for fever and chills.  Respiratory: Positive for cough. Negative for shortness of breath.   Gastrointestinal: Positive for nausea and abdominal pain. Negative for vomiting and constipation.  Genitourinary: Positive for vaginal bleeding and vaginal discharge. Negative for dysuria and frequency.  Musculoskeletal: Negative for back pain.  Skin: Negative for rash.  Neurological: Negative for dizziness and headaches.  Psychiatric/Behavioral: The patient is not nervous/anxious.   Implant for birth control  Allergies  Codeine  Home Medications   Current Outpatient Rx  Name  Route  Sig  Dispense  Refill  . ibuprofen (ADVIL,MOTRIN) 200 MG tablet   Oral   Take 800 mg by mouth every 6 (six) hours as needed for pain.         . Etonogestrel (IMPLANON [place])   Subcutaneous   Inject 1 each into the skin once.             BP 105/71  Pulse 99  Temp(Src) 97.2 F (36.2 C) (Oral)  Resp 18  Ht 5\' 2"  (1.575 m)  Wt 100 lb (45.36 kg)  BMI 18.29 kg/m2  SpO2 100%  LMP 04/15/2013  Physical Exam  Nursing note and vitals reviewed. Constitutional: She is oriented to  person, place, and time. She appears well-developed and well-nourished.  HENT:  Head: Normocephalic and atraumatic.  Eyes: EOM are normal.  Neck: Neck supple.  Cardiovascular: Normal rate, regular rhythm and normal heart sounds.   Pulmonary/Chest: Effort normal and breath sounds normal. No respiratory distress.  Abdominal: Soft. There is tenderness.  Tender lower bilateral lower abdomen with palpation. No guarding or rebound.  Genitourinary:  External genitalia without lesions. Scant frothy, blood discharge vaginal vault. Positive CMT, bilateral adnexal tenderness. Uterus without palpable enlargement.  Musculoskeletal: Normal range of motion.  Neurological: She is alert and  oriented to person, place, and time. No cranial nerve deficit.  Skin: Skin is warm and dry.  Psychiatric: She has a normal mood and affect. Her behavior is normal. Judgment and thought content normal.   Results for orders placed during the hospital encounter of 04/27/13 (from the past 24 hour(s))  URINALYSIS, ROUTINE W REFLEX MICROSCOPIC     Status: Abnormal   Collection Time    04/27/13  5:40 PM      Result Value Range   Color, Urine YELLOW  YELLOW   APPearance HAZY (*) CLEAR   Specific Gravity, Urine <1.005 (*) 1.005 - 1.030   pH 6.0  5.0 - 8.0   Glucose, UA NEGATIVE  NEGATIVE mg/dL   Hgb urine dipstick LARGE (*) NEGATIVE   Bilirubin Urine NEGATIVE  NEGATIVE   Ketones, ur NEGATIVE  NEGATIVE mg/dL   Protein, ur TRACE (*) NEGATIVE mg/dL   Urobilinogen, UA 0.2  0.0 - 1.0 mg/dL   Nitrite NEGATIVE  NEGATIVE   Leukocytes, UA MODERATE (*) NEGATIVE  PREGNANCY, URINE     Status: None   Collection Time    04/27/13  5:40 PM      Result Value Range   Preg Test, Ur NEGATIVE  NEGATIVE  URINE MICROSCOPIC-ADD ON     Status: Abnormal   Collection Time    04/27/13  5:40 PM      Result Value Range   Squamous Epithelial / LPF FEW (*) RARE   WBC, UA 7-10  <3 WBC/hpf   RBC / HPF TOO NUMEROUS TO COUNT  <3 RBC/hpf   Bacteria, UA FEW (*) RARE  CBC WITH DIFFERENTIAL     Status: None   Collection Time    04/27/13  6:45 PM      Result Value Range   WBC 7.8  4.0 - 10.5 K/uL   RBC 4.08  3.87 - 5.11 MIL/uL   Hemoglobin 12.7  12.0 - 15.0 g/dL   HCT 78.2  95.6 - 21.3 %   MCV 90.9  78.0 - 100.0 fL   MCH 31.1  26.0 - 34.0 pg   MCHC 34.2  30.0 - 36.0 g/dL   RDW 08.6  57.8 - 46.9 %   Platelets 253  150 - 400 K/uL   Neutrophils Relative 59  43 - 77 %   Neutro Abs 4.6  1.7 - 7.7 K/uL   Lymphocytes Relative 34  12 - 46 %   Lymphs Abs 2.6  0.7 - 4.0 K/uL   Monocytes Relative 6  3 - 12 %   Monocytes Absolute 0.4  0.1 - 1.0 K/uL   Eosinophils Relative 2  0 - 5 %   Eosinophils Absolute 0.1  0.0 - 0.7  K/uL   Basophils Relative 0  0 - 1 %   Basophils Absolute 0.0  0.0 - 0.1 K/uL  BASIC METABOLIC PANEL     Status: Abnormal  Collection Time    04/27/13  6:45 PM      Result Value Range   Sodium 139  135 - 145 mEq/L   Potassium 3.3 (*) 3.5 - 5.1 mEq/L   Chloride 105  96 - 112 mEq/L   CO2 27  19 - 32 mEq/L   Glucose, Bld 110 (*) 70 - 99 mg/dL   BUN 9  6 - 23 mg/dL   Creatinine, Ser 1.61  0.50 - 1.10 mg/dL   Calcium 8.7  8.4 - 09.6 mg/dL   GFR calc non Af Amer >90  >90 mL/min   GFR calc Af Amer >90  >90 mL/min  WET PREP, GENITAL     Status: Abnormal   Collection Time    04/27/13  7:20 PM      Result Value Range   Yeast Wet Prep HPF POC NONE SEEN  NONE SEEN   Trich, Wet Prep FEW (*) NONE SEEN   Clue Cells Wet Prep HPF POC NONE SEEN  NONE SEEN   WBC, Wet Prep HPF POC FEW (*) NONE SEEN     Assessment: 22 y.o. female with abnormal vaginal bleeding most like caused by trichomonas infection   Pelvic pain   UTI  Plan:  Cultures for GC and Chlamydia and urine sent   Rocephin 250 mg IM   Zithromax 1 gram PO   Flagyl Rx   Pain management   Follow up with Dr. Emelda Fear, return as needed ED Course  Procedures (including critical care time)  MDM  I have reviewed this patient's vital signs, nurses notes, appropriate labs and discussed findings with the patient and plan of care. Patient voices understanding.    Medication List    TAKE these medications       HYDROcodone-acetaminophen 5-325 MG per tablet  Commonly known as:  NORCO/VICODIN  Take 1 tablet by mouth every 4 (four) hours as needed.     metroNIDAZOLE 500 MG tablet  Commonly known as:  FLAGYL  Take 1 tablet (500 mg total) by mouth 2 (two) times daily.      ASK your doctor about these medications       ibuprofen 200 MG tablet  Commonly known as:  ADVIL,MOTRIN  Take 800 mg by mouth every 6 (six) hours as needed for pain.     IMPLANON [place]  Inject 1 each into the skin once.               [place],  [place] 04/27/13 2032

## 2013-04-27 NOTE — ED Notes (Signed)
Pt alert & oriented x4, stable gait. Patient given discharge instructions, paperwork & prescription(s). Patient  instructed to stop at the registration desk to finish any additional paperwork. Patient verbalized understanding. Pt left department w/ no further questions. 

## 2013-04-27 NOTE — ED Notes (Signed)
Pt reports pelvic pain that started yesterday. Pt also having vaginal bleeding & she finished her period on the 19th. Pt states some nausea, denies vomiting, last bowel movement this morning & was normal.

## 2013-04-27 NOTE — ED Notes (Signed)
abd pain,vag bleeding, Hx of ovarian cysts.

## 2013-04-28 LAB — URINE CULTURE

## 2013-04-29 LAB — GC/CHLAMYDIA PROBE AMP
CT Probe RNA: NEGATIVE
GC Probe RNA: NEGATIVE

## 2013-05-25 ENCOUNTER — Emergency Department (HOSPITAL_COMMUNITY): Payer: Self-pay

## 2013-05-25 ENCOUNTER — Encounter (HOSPITAL_COMMUNITY): Payer: Self-pay | Admitting: *Deleted

## 2013-05-25 ENCOUNTER — Emergency Department (HOSPITAL_COMMUNITY)
Admission: EM | Admit: 2013-05-25 | Discharge: 2013-05-25 | Disposition: A | Discharge status: Home / Self Care | Payer: Self-pay | Attending: Emergency Medicine | Admitting: Emergency Medicine

## 2013-05-25 DIAGNOSIS — Z79899 Other long term (current) drug therapy: Secondary | ICD-10-CM | POA: Insufficient documentation

## 2013-05-25 DIAGNOSIS — R350 Frequency of micturition: Secondary | ICD-10-CM | POA: Insufficient documentation

## 2013-05-25 DIAGNOSIS — Z8742 Personal history of other diseases of the female genital tract: Secondary | ICD-10-CM | POA: Insufficient documentation

## 2013-05-25 DIAGNOSIS — R109 Unspecified abdominal pain: Secondary | ICD-10-CM | POA: Insufficient documentation

## 2013-05-25 DIAGNOSIS — R300 Dysuria: Secondary | ICD-10-CM | POA: Insufficient documentation

## 2013-05-25 DIAGNOSIS — Z3202 Encounter for pregnancy test, result negative: Secondary | ICD-10-CM | POA: Insufficient documentation

## 2013-05-25 DIAGNOSIS — F17200 Nicotine dependence, unspecified, uncomplicated: Secondary | ICD-10-CM | POA: Insufficient documentation

## 2013-05-25 DIAGNOSIS — R319 Hematuria, unspecified: Secondary | ICD-10-CM | POA: Insufficient documentation

## 2013-05-25 DIAGNOSIS — M549 Dorsalgia, unspecified: Secondary | ICD-10-CM

## 2013-05-25 LAB — URINE MICROSCOPIC-ADD ON

## 2013-05-25 LAB — URINALYSIS, ROUTINE W REFLEX MICROSCOPIC
Bilirubin Urine: NEGATIVE
Glucose, UA: NEGATIVE mg/dL
Ketones, ur: NEGATIVE mg/dL
Nitrite: NEGATIVE
pH: 6.5 (ref 5.0–8.0)

## 2013-05-25 MED ORDER — HYDROCODONE-ACETAMINOPHEN 5-325 MG PO TABS
1.0000 | ORAL_TABLET | Freq: Once | ORAL | Status: AC
  Administered 2013-05-25: 1 via ORAL
  Filled 2013-05-25: qty 1

## 2013-05-25 MED ORDER — IBUPROFEN 400 MG PO TABS: 400.0000 mg | ORAL_TABLET | Freq: Four times a day (QID) | ORAL | Status: DC | PRN

## 2013-05-25 MED ORDER — HYDROCODONE-ACETAMINOPHEN 5-325 MG PO TABS: 1.0000 | ORAL_TABLET | ORAL | Status: DC | PRN

## 2013-05-25 NOTE — ED Notes (Signed)
Pain lt flank since Monday , nausea no vomiting, Fever 102 on Tuesday.dysuria.

## 2013-05-25 NOTE — ED Notes (Addendum)
Pt reports lower back pain & left side flank pain that started on Monday. Thinking it may be kidney stones.

## 2013-05-29 NOTE — ED Provider Notes (Signed)
History     CSN: [id number]  Arrival date & time 05/25/13  1821   First MD Initiated Contact with Patient 05/25/13 1917      Chief Complaint  Patient presents with  . Back Pain  . Flank Pain    (Consider location/radiation/quality/duration/timing/severity/associated sxs/prior treatment) HPI Comments: Rebekah [name] is a 22 y.o. female  Presenting with a 3 day history of left flank pain which has been aching,  Fairly constant without radiation which is worsened with movement and palpation.  She also has had nausea without emesis and painful urination.  She denies hematuria, but has had increased urinary frequency.  She denies a personal history but has a family history of kidney stones. She has taken ibuprofen without relief of symptoms.  She has been with the same partner for the past 3 years, she denies vaginal discharge and does not have a history of std's.  She was diagnosed with a right ovarian cyst one month ago.     Patient is a 22 y.o. female presenting with flank pain. The history is provided by the patient.  Flank Pain Pertinent negatives include no abdominal pain, arthralgias, chest pain, congestion, fever, headaches, joint swelling, nausea, neck pain, numbness, rash, sore throat or weakness.    Past Medical History  Diagnosis Date  . Ovarian cyst     Past Surgical History  Procedure Laterality Date  . Cholecystectomy  07/31/2011    Procedure: LAPAROSCOPIC CHOLECYSTECTOMY;  Surgeon: Fabio Bering;  Location: [facility];  Service: General;  Laterality: N/A;    Family History  Problem Relation Age of Onset  . Hypertension Other   . Diabetes Other   . Anesthesia problems Other   . Malignant hyperthermia Other     History  Substance Use Topics  . Smoking status: Current Every Day Smoker -- 1.00 packs/day    Types: Cigarettes  . Smokeless tobacco: Not on file  . Alcohol Use: 0.6 oz/week    1 Cans of beer per week     Comment: occasional    OB History   Grav  Para Term Preterm Abortions TAB SAB Ect Mult Living                  Review of Systems  Constitutional: Negative for fever.  HENT: Negative for congestion, sore throat and neck pain.   Eyes: Negative.   Respiratory: Negative for chest tightness and shortness of breath.   Cardiovascular: Negative for chest pain.  Gastrointestinal: Negative for nausea and abdominal pain.  Genitourinary: Positive for dysuria, frequency and flank pain. Negative for vaginal bleeding and vaginal discharge.  Musculoskeletal: Negative for joint swelling and arthralgias.  Skin: Negative.  Negative for rash and wound.  Neurological: Negative for dizziness, weakness, light-headedness, numbness and headaches.  Psychiatric/Behavioral: Negative.     Allergies  Codeine  Home Medications   Current Outpatient Rx  Name  Route  Sig  Dispense  Refill  . Etonogestrel (IMPLANON [place])   Subcutaneous   Inject 1 each into the skin once.           Marland Kitchen ibuprofen (ADVIL,MOTRIN) 200 MG tablet   Oral   Take 800 mg by mouth every 6 (six) hours as needed for pain.         Marland Kitchen HYDROcodone-acetaminophen (NORCO/VICODIN) 5-325 MG per tablet   Oral   Take 1 tablet by mouth every 4 (four) hours as needed for pain.   15 tablet   0   . ibuprofen (ADVIL,MOTRIN)  400 MG tablet   Oral   Take 1 tablet (400 mg total) by mouth every 6 (six) hours as needed for pain.   30 tablet   0     BP 108/65  Pulse 92  Temp(Src) 98.8 F (37.1 C) (Oral)  Resp 19  Ht 5\' 2"  (1.575 m)  Wt 100 lb (45.36 kg)  BMI 18.29 kg/m2  SpO2 98%  LMP 04/27/2013  Physical Exam  Nursing note and vitals reviewed. Constitutional: She appears well-developed and well-nourished.  HENT:  Head: Normocephalic and atraumatic.  Eyes: Conjunctivae are normal.  Neck: Normal range of motion.  Cardiovascular: Normal rate, regular rhythm, normal heart sounds and intact distal pulses.   Pulmonary/Chest: Effort normal and breath sounds normal. She has no wheezes.   Abdominal: Soft. Bowel sounds are normal. She exhibits no distension. There is no tenderness. There is no rebound, no guarding and no CVA tenderness.  Musculoskeletal: Normal range of motion.       Back:  Neurological: She is alert.  Skin: Skin is warm and dry.  Psychiatric: She has a normal mood and affect.    ED Course  Procedures (including critical care time)  Labs Reviewed  URINALYSIS, ROUTINE W REFLEX MICROSCOPIC - Abnormal; Notable for the following:    Hgb urine dipstick LARGE (*)    Protein, ur TRACE (*)    Leukocytes, UA SMALL (*)    All other components within normal limits  URINE MICROSCOPIC-ADD ON - Abnormal; Notable for the following:    Squamous Epithelial / LPF MANY (*)    Bacteria, UA FEW (*)    All other components within normal limits  PREGNANCY, URINE   No results found.   1. Back pain   2. Hematuria of undiagnosed cause       MDM  Urine culture sent,  hemauturia without pyuria, few bacteria in a not clean catch specimen.  Ct scan reviewed.  Pt was placed on ibuprofen,  Hydrocodone for pain.  Exam c/w musculoskeletal source for back pain, given it is reproducible and worsened with movement.  Referrals given for obtaining pcp.  Pt encouraged return here if sx worsen.          Burgess Amor, PA-C 05/29/13 2234

## 2013-05-30 NOTE — ED Provider Notes (Signed)
Medical screening examination/treatment/procedure(s) were performed by non-physician practitioner and as supervising physician I was immediately available for consultation/collaboration.   Sherlyn Ebbert L Akshaj Besancon, MD 05/30/13 1614 

## 2013-07-18 ENCOUNTER — Encounter (HOSPITAL_COMMUNITY): Payer: Self-pay | Admitting: Emergency Medicine

## 2013-07-18 ENCOUNTER — Emergency Department (HOSPITAL_COMMUNITY): Payer: Self-pay

## 2013-07-18 ENCOUNTER — Emergency Department (HOSPITAL_COMMUNITY)
Admission: EM | Admit: 2013-07-18 | Discharge: 2013-07-18 | Disposition: A | Discharge status: Home / Self Care | Payer: Self-pay | Attending: Emergency Medicine | Admitting: Emergency Medicine

## 2013-07-18 DIAGNOSIS — R300 Dysuria: Secondary | ICD-10-CM | POA: Insufficient documentation

## 2013-07-18 DIAGNOSIS — R110 Nausea: Secondary | ICD-10-CM | POA: Insufficient documentation

## 2013-07-18 DIAGNOSIS — N83209 Unspecified ovarian cyst, unspecified side: Secondary | ICD-10-CM | POA: Insufficient documentation

## 2013-07-18 DIAGNOSIS — N898 Other specified noninflammatory disorders of vagina: Secondary | ICD-10-CM | POA: Insufficient documentation

## 2013-07-18 DIAGNOSIS — F17200 Nicotine dependence, unspecified, uncomplicated: Secondary | ICD-10-CM | POA: Insufficient documentation

## 2013-07-18 DIAGNOSIS — Z3202 Encounter for pregnancy test, result negative: Secondary | ICD-10-CM | POA: Insufficient documentation

## 2013-07-18 DIAGNOSIS — M545 Low back pain, unspecified: Secondary | ICD-10-CM | POA: Insufficient documentation

## 2013-07-18 DIAGNOSIS — R3989 Other symptoms and signs involving the genitourinary system: Secondary | ICD-10-CM | POA: Insufficient documentation

## 2013-07-18 LAB — URINALYSIS, ROUTINE W REFLEX MICROSCOPIC
Nitrite: NEGATIVE
Protein, ur: NEGATIVE mg/dL
Urobilinogen, UA: 0.2 mg/dL (ref 0.0–1.0)

## 2013-07-18 LAB — URINE MICROSCOPIC-ADD ON

## 2013-07-18 LAB — WET PREP, GENITAL
Trich, Wet Prep: NONE SEEN
Yeast Wet Prep HPF POC: NONE SEEN

## 2013-07-18 LAB — PREGNANCY, URINE: Preg Test, Ur: NEGATIVE

## 2013-07-18 MED ORDER — HYDROCODONE-ACETAMINOPHEN 5-325 MG PO TABS: 1.0000 | ORAL_TABLET | Freq: Four times a day (QID) | ORAL | Status: DC | PRN

## 2013-07-18 MED ORDER — HYDROCODONE-ACETAMINOPHEN 5-325 MG PO TABS
1.0000 | ORAL_TABLET | Freq: Once | ORAL | Status: AC
  Administered 2013-07-18: 1 via ORAL
  Filled 2013-07-18: qty 1

## 2013-07-18 MED ORDER — NAPROXEN 500 MG PO TABS: 500.0000 mg | ORAL_TABLET | Freq: Two times a day (BID) | ORAL | Status: DC

## 2013-07-18 NOTE — ED Notes (Signed)
EDP has been in emergency, EDP aware pt requesting more pain med.

## 2013-07-18 NOTE — ED Notes (Signed)
Pelvic cart set up 

## 2013-07-18 NOTE — ED Provider Notes (Signed)
History  This chart was scribed for Rebekah Jakes, MD, by [name], ED Scribe. This patient was seen in room [facility] and the patient's care was started at 10:46 AM  CSN: [id number] Arrival date & time 07/18/13  1002  First MD Initiated Contact with Patient 07/18/13 1029     Chief Complaint  Patient presents with  . Abdominal Pain    Patient is a 22 y.o. female presenting with abdominal pain. The history is provided by the patient. No language interpreter was used.  Abdominal Pain The current episode started 2 days ago. The problem occurs constantly. Associated symptoms include abdominal pain. Pertinent negatives include no chest pain, no headaches and no shortness of breath.   HPI Comments: [name] is a 22 y.o. female, with a h/o of ovarian cysts, who presents to the Emergency Department complaining of LLQ abdominal and suprapubic pain which began two days ago. The pt reports her pain is a 8/10. She states that she has also experienced pressure with urination, lower back pain, nausea, and white vaginal discharge. The vaginal discharge occurred prior to the abdominal pain. She denies experiencing any emesis and diarrhea. She is a daily smoker, and she uses alcohol.   Past Medical History  Diagnosis Date  . Ovarian cyst    Past Surgical History  Procedure Laterality Date  . Cholecystectomy  07/31/2011    Procedure: LAPAROSCOPIC CHOLECYSTECTOMY;  Surgeon: Fabio Bering;  Location: [facility];  Service: General;  Laterality: N/A;   Family History  Problem Relation Age of Onset  . Hypertension Other   . Diabetes Other   . Anesthesia problems Other   . Malignant hyperthermia Other    History  Substance Use Topics  . Smoking status: Current Every Day Smoker -- 1.00 packs/day    Types: Cigarettes  . Smokeless tobacco: Not on file  . Alcohol Use: 0.6 oz/week    1 Cans of beer per week     Comment: occasional   No OB history provided.  Review of Systems   Constitutional: Negative for fever and chills.  HENT: Negative for congestion, sore throat and rhinorrhea.   Eyes: Negative for visual disturbance.  Respiratory: Negative for shortness of breath.   Cardiovascular: Negative for chest pain and leg swelling.  Gastrointestinal: Positive for nausea and abdominal pain. Negative for vomiting and diarrhea.  Genitourinary: Positive for dysuria (Pressure, not burning.) and vaginal discharge.  Musculoskeletal: Positive for back pain.  Skin: Negative for rash.  Neurological: Negative for headaches.  Hematological: Does not bruise/bleed easily.  All other systems reviewed and are negative.    Allergies  Codeine  Home Medications   Current Outpatient Rx  Name  Route  Sig  Dispense  Refill  . Etonogestrel (IMPLANON [place])   Subcutaneous   Inject 1 each into the skin once.           Marland Kitchen HYDROcodone-acetaminophen (NORCO/VICODIN) 5-325 MG per tablet   Oral   Take 1-2 tablets by mouth every 6 (six) hours as needed for pain.   14 tablet   0   . naproxen (NAPROSYN) 500 MG tablet   Oral   Take 1 tablet (500 mg total) by mouth 2 (two) times daily.   14 tablet   0    Triage Vitals: BP 99/63  Pulse 84  Temp(Src) 98.2 F (36.8 C) (Oral)  Resp 17  Ht 5\' 2"  (1.575 m)  Wt 100 lb (45.36 kg)  BMI 18.29 kg/m2  SpO2 99%  LMP 07/04/2013  Physical Exam  Nursing note and vitals reviewed. Constitutional: She is oriented to person, place, and time. She appears well-developed and well-nourished. No distress.  HENT:  Head: Normocephalic and atraumatic.  Eyes: Conjunctivae and EOM are normal. Pupils are equal, round, and reactive to light.  Neck: Neck supple. No tracheal deviation present.  Cardiovascular: Normal rate, regular rhythm and normal heart sounds.   Pulmonary/Chest: Effort normal and breath sounds normal. No respiratory distress.  Abdominal: Bowel sounds are normal. There is tenderness.  Suprapubic and LLQ tenderness.   Genitourinary:  Vaginal discharge found.  Normal external genitalia. Small amount of white vaginal discharge. Normal cervix. No CMT. Moderate left adnexal tenderness. No right adnexal tenderness. No uterine tenderness.   Musculoskeletal: Normal range of motion.  Neurological: She is alert and oriented to person, place, and time. She has normal reflexes. No cranial nerve deficit.  Skin: Skin is warm and dry.  Psychiatric: She has a normal mood and affect. Her behavior is normal.    ED Course  Procedures (including critical care time)  DIAGNOSTIC STUDIES: Oxygen Saturation is 99% on room air, normal by my interpretation.    COORDINATION OF CARE:  10:50 AM- Discussed treatment plan with patient which includes labs and pain medication, and the patient agreed to the plan.   12:32 PM- Rechecked pt. Performed pelvic exam.   Labs Reviewed  WET PREP, GENITAL - Abnormal; Notable for the following:    WBC, Wet Prep HPF POC RARE (*)    All other components within normal limits  URINALYSIS, ROUTINE W REFLEX MICROSCOPIC - Abnormal; Notable for the following:    Hgb urine dipstick MODERATE (*)    Leukocytes, UA TRACE (*)    All other components within normal limits  URINE MICROSCOPIC-ADD ON - Abnormal; Notable for the following:    Squamous Epithelial / LPF FEW (*)    Bacteria, UA FEW (*)    All other components within normal limits  GC/CHLAMYDIA PROBE AMP  PREGNANCY, URINE   Ct Abdomen Pelvis Wo Contrast  07/18/2013   *RADIOLOGY REPORT*  Clinical Data: Left-sided abdominal pain.  CT ABDOMEN AND PELVIS WITHOUT CONTRAST  Technique:  Multidetector CT imaging of the abdomen and pelvis was performed following the standard protocol without intravenous contrast.  Comparison: CT of the abdomen and pelvis 05/25/2013.  Findings:  Lung Bases: Unremarkable.  Abdomen/Pelvis:  There are no abnormal calcifications within the collecting system of either kidney, along the course of either ureter, or within the lumen of the  urinary bladder.  No hydroureteronephrosis or perinephric stranding to suggest urinary tract obstruction at this time.  The unenhanced appearance of the kidneys is unremarkable bilaterally.  Status post cholecystectomy.  The unenhanced appearance of the liver, pancreas, spleen and bilateral adrenal glands is unremarkable.  A 3.8 x 2.1 x 2.6 cm low attenuation lesion in the left adnexa likely represents an ovarian cyst.  Right ovary and uterus are otherwise unremarkable.  A trace volume of free fluid in the cul-de-sac, presumably physiologic in this young female patient.  No larger volume of ascites.  No pneumoperitoneum.  No pathologic distension of small bowel.  No definite pathologic lymphadenopathy identified within the abdomen or pelvis on today's noncontrast CT examination.  The appendix is not confidently identified, however, no inflammatory changes are noted adjacent to the cecum to suggest an acute appendicitis at this time.  Musculoskeletal: There are no aggressive appearing lytic or blastic lesions noted in the visualized portions of the skeleton.  IMPRESSION: 1.  No acute findings in the abdomen or pelvis to account for the patient's symptoms. 2.  Trace volume of free fluid the cul-de-sac is presumably physiologic in this young female patient. 3.  Well-defined 3.8 x 2.1 x 2.6 cm low attenuation lesion in the left adnexa likely represents an ovarian cyst. 4.  Status post cholecystectomy.   Original Report Authenticated By: Trudie Reed, M.D.    Results for orders placed during the hospital encounter of 07/18/13  WET PREP, GENITAL      Result Value Range   Yeast Wet Prep HPF POC NONE SEEN  NONE SEEN   Trich, Wet Prep NONE SEEN  NONE SEEN   Clue Cells Wet Prep HPF POC NONE SEEN  NONE SEEN   WBC, Wet Prep HPF POC RARE (*) NONE SEEN  URINALYSIS, ROUTINE W REFLEX MICROSCOPIC      Result Value Range   Color, Urine YELLOW  YELLOW   APPearance CLEAR  CLEAR   Specific Gravity, Urine 1.010  1.005 -  1.030   pH 6.5  5.0 - 8.0   Glucose, UA NEGATIVE  NEGATIVE mg/dL   Hgb urine dipstick MODERATE (*) NEGATIVE   Bilirubin Urine NEGATIVE  NEGATIVE   Ketones, ur NEGATIVE  NEGATIVE mg/dL   Protein, ur NEGATIVE  NEGATIVE mg/dL   Urobilinogen, UA 0.2  0.0 - 1.0 mg/dL   Nitrite NEGATIVE  NEGATIVE   Leukocytes, UA TRACE (*) NEGATIVE  PREGNANCY, URINE      Result Value Range   Preg Test, Ur NEGATIVE  NEGATIVE  URINE MICROSCOPIC-ADD ON      Result Value Range   Squamous Epithelial / LPF FEW (*) RARE   WBC, UA 0-2  <3 WBC/hpf   RBC / HPF 7-10  <3 RBC/hpf   Bacteria, UA FEW (*) RARE     1. Ovarian cyst     MDM  Pelvic and CT scan consistent with left-sided ovarian cyst. Pelvic cultures are pending wet prep without significant findings.  I personally performed the services described in this documentation, which was scribed in my presence. The recorded information has been reviewed and is accurate.     Rebekah Jakes, MD 07/18/13 [phone]

## 2013-07-18 NOTE — ED Notes (Signed)
Pt c/o lower abd pain with pressure with urination and "creamy" vag d/c,. Denies n/v/d. lnbm yesterday.nad. Mm wet.

## 2013-08-14 ENCOUNTER — Emergency Department (HOSPITAL_COMMUNITY)
Admission: EM | Admit: 2013-08-14 | Discharge: 2013-08-14 | Disposition: A | Discharge status: Home / Self Care | Payer: Self-pay | Attending: Emergency Medicine | Admitting: Emergency Medicine

## 2013-08-14 ENCOUNTER — Encounter (HOSPITAL_COMMUNITY): Payer: Self-pay | Admitting: Emergency Medicine

## 2013-08-14 DIAGNOSIS — K0889 Other specified disorders of teeth and supporting structures: Secondary | ICD-10-CM

## 2013-08-14 DIAGNOSIS — Z8742 Personal history of other diseases of the female genital tract: Secondary | ICD-10-CM | POA: Insufficient documentation

## 2013-08-14 DIAGNOSIS — F17200 Nicotine dependence, unspecified, uncomplicated: Secondary | ICD-10-CM | POA: Insufficient documentation

## 2013-08-14 DIAGNOSIS — K089 Disorder of teeth and supporting structures, unspecified: Secondary | ICD-10-CM | POA: Insufficient documentation

## 2013-08-14 MED ORDER — IBUPROFEN 800 MG PO TABS: 800.0000 mg | ORAL_TABLET | Freq: Three times a day (TID) | ORAL | Status: DC

## 2013-08-14 MED ORDER — AMOXICILLIN 500 MG PO CAPS: 500.0000 mg | ORAL_CAPSULE | Freq: Three times a day (TID) | ORAL | Status: DC

## 2013-08-14 NOTE — ED Provider Notes (Signed)
CSN: [id number]     Arrival date & time 08/14/13  1158 History     First MD Initiated Contact with Patient 08/14/13 1256     Chief Complaint  Patient presents with  . Dental Pain   (Consider location/radiation/quality/duration/timing/severity/associated sxs/prior Treatment) Patient is a 22 y.o. female presenting with tooth pain. The history is provided by the patient.  Dental Pain Location:  Upper Upper teeth location:  16/LU 3rd molar, 15/LU 2nd molar and 14/LU 1st molar Quality:  Aching Severity:  Severe Onset quality:  Gradual Duration:  3 days Timing:  Intermittent Progression:  Worsening Chronicity:  Chronic Context: dental caries and poor dentition   Context: not abscess   Relieved by:  Nothing Worsened by:  Nothing tried Associated symptoms: no difficulty swallowing, no neck pain and no oral bleeding   Risk factors: alcohol problem and smoking     Past Medical History  Diagnosis Date  . Ovarian cyst    Past Surgical History  Procedure Laterality Date  . Cholecystectomy  07/31/2011    Procedure: LAPAROSCOPIC CHOLECYSTECTOMY;  Surgeon: Fabio Bering;  Location: [facility];  Service: General;  Laterality: N/A;   Family History  Problem Relation Age of Onset  . Hypertension Other   . Diabetes Other   . Anesthesia problems Other   . Malignant hyperthermia Other    History  Substance Use Topics  . Smoking status: Current Every Day Smoker -- 1.00 packs/day    Types: Cigarettes  . Smokeless tobacco: Not on file  . Alcohol Use: 0.6 oz/week    1 Cans of beer per week     Comment: occasional   OB History   Grav Para Term Preterm Abortions TAB SAB Ect Mult Living                 Review of Systems  Constitutional: Negative for activity change.       All ROS Neg except as noted in HPI  HENT: Positive for dental problem. Negative for nosebleeds and neck pain.   Eyes: Negative for photophobia and discharge.  Respiratory: Negative for cough, shortness of breath and  wheezing.   Cardiovascular: Negative for chest pain and palpitations.  Gastrointestinal: Negative for abdominal pain and blood in stool.  Genitourinary: Negative for dysuria, frequency and hematuria.  Musculoskeletal: Negative for back pain and arthralgias.  Skin: Negative.   Neurological: Negative for dizziness, seizures and speech difficulty.  Psychiatric/Behavioral: Negative for hallucinations and confusion.    Allergies  Codeine  Home Medications   Current Outpatient Rx  Name  Route  Sig  Dispense  Refill  . Etonogestrel (IMPLANON [place])   Subcutaneous   Inject 1 each into the skin once.           Marland Kitchen HYDROcodone-acetaminophen (NORCO/VICODIN) 5-325 MG per tablet   Oral   Take 1-2 tablets by mouth every 6 (six) hours as needed for pain.   14 tablet   0   . naproxen (NAPROSYN) 500 MG tablet   Oral   Take 1 tablet (500 mg total) by mouth 2 (two) times daily.   14 tablet   0    BP 94/54  Pulse 98  Temp(Src) 98.3 F (36.8 C) (Oral)  Resp 20  Wt 100 lb (45.36 kg)  BMI 18.29 kg/m2  SpO2 100%  LMP 06/27/2013 Physical Exam  Nursing note and vitals reviewed. Constitutional: She is oriented to person, place, and time. She appears well-developed and well-nourished.  Non-toxic appearance.  HENT:  Head:  Normocephalic.  Right Ear: Tympanic membrane and external ear normal.  Left Ear: Tympanic membrane and external ear normal.  Mouth/Throat:    Eyes: EOM and lids are normal. Pupils are equal, round, and reactive to light.  Neck: Normal range of motion. Neck supple. Carotid bruit is not present.  Cardiovascular: Normal rate, regular rhythm, normal heart sounds, intact distal pulses and normal pulses.   Pulmonary/Chest: Breath sounds normal. No respiratory distress.  Abdominal: Soft. Bowel sounds are normal. There is no tenderness. There is no guarding.  Musculoskeletal: Normal range of motion.  Lymphadenopathy:       Head (right side): No submandibular adenopathy present.         Head (left side): No submandibular adenopathy present.    She has no cervical adenopathy.  Neurological: She is alert and oriented to person, place, and time. She has normal strength. No cranial nerve deficit or sensory deficit.  Skin: Skin is warm and dry.  Psychiatric: She has a normal mood and affect. Her speech is normal.    ED Course   Procedures (including critical care time)  Labs Reviewed - No data to display No results found. No diagnosis found.  MDM  *I have reviewed nursing notes, vital signs, and all appropriate lab and imaging results for this patient.** Pt has a long hx of dental problems. This episode started Aug. 15, 2014. Pt given dental resource packet. Rx for amoxil and ibuprofen 800mg  given to the patient.  Kathie Dike, PA-C 08/14/13 1335

## 2013-08-14 NOTE — ED Provider Notes (Signed)
Medical screening examination/treatment/procedure(s) were performed by non-physician practitioner and as supervising physician I was immediately available for consultation/collaboration.  Flint Melter, MD 08/14/13 2025

## 2013-08-14 NOTE — ED Notes (Signed)
States that she has a toothache (left upper).  States that she did sustain a tooth injury to that area about 2 months ago.

## 2013-08-14 NOTE — ED Notes (Signed)
Lt upper molar dental pain, Has mult dental caries.  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

## 2014-01-09 ENCOUNTER — Other Ambulatory Visit (HOSPITAL_COMMUNITY): Payer: Self-pay | Admitting: Emergency Medicine

## 2014-01-09 ENCOUNTER — Encounter (HOSPITAL_COMMUNITY): Payer: Self-pay | Admitting: Emergency Medicine

## 2014-01-09 ENCOUNTER — Ambulatory Visit (HOSPITAL_COMMUNITY)
Admit: 2014-01-09 | Discharge: 2014-01-09 | Disposition: A | Discharge status: Home / Self Care | Payer: Self-pay | Attending: Emergency Medicine | Admitting: Emergency Medicine

## 2014-01-09 ENCOUNTER — Emergency Department (HOSPITAL_COMMUNITY)
Admission: EM | Admit: 2014-01-09 | Discharge: 2014-01-09 | Disposition: A | Discharge status: Home / Self Care | Payer: Self-pay | Attending: Emergency Medicine | Admitting: Emergency Medicine

## 2014-01-09 DIAGNOSIS — Z3202 Encounter for pregnancy test, result negative: Secondary | ICD-10-CM | POA: Insufficient documentation

## 2014-01-09 DIAGNOSIS — Z9089 Acquired absence of other organs: Secondary | ICD-10-CM | POA: Insufficient documentation

## 2014-01-09 DIAGNOSIS — M545 Low back pain, unspecified: Secondary | ICD-10-CM | POA: Insufficient documentation

## 2014-01-09 DIAGNOSIS — R102 Pelvic and perineal pain: Secondary | ICD-10-CM

## 2014-01-09 DIAGNOSIS — Z8742 Personal history of other diseases of the female genital tract: Secondary | ICD-10-CM | POA: Insufficient documentation

## 2014-01-09 DIAGNOSIS — N949 Unspecified condition associated with female genital organs and menstrual cycle: Secondary | ICD-10-CM | POA: Insufficient documentation

## 2014-01-09 DIAGNOSIS — F17200 Nicotine dependence, unspecified, uncomplicated: Secondary | ICD-10-CM | POA: Insufficient documentation

## 2014-01-09 DIAGNOSIS — N939 Abnormal uterine and vaginal bleeding, unspecified: Secondary | ICD-10-CM

## 2014-01-09 DIAGNOSIS — N898 Other specified noninflammatory disorders of vagina: Secondary | ICD-10-CM | POA: Insufficient documentation

## 2014-01-09 DIAGNOSIS — Z792 Long term (current) use of antibiotics: Secondary | ICD-10-CM | POA: Insufficient documentation

## 2014-01-09 DIAGNOSIS — Z791 Long term (current) use of non-steroidal anti-inflammatories (NSAID): Secondary | ICD-10-CM | POA: Insufficient documentation

## 2014-01-09 DIAGNOSIS — N390 Urinary tract infection, site not specified: Secondary | ICD-10-CM | POA: Insufficient documentation

## 2014-01-09 DIAGNOSIS — N83209 Unspecified ovarian cyst, unspecified side: Secondary | ICD-10-CM | POA: Insufficient documentation

## 2014-01-09 LAB — CBC WITH DIFFERENTIAL/PLATELET
Basophils Absolute: 0 10*3/uL (ref 0.0–0.1)
Basophils Relative: 0 % (ref 0–1)
EOS PCT: 3 % (ref 0–5)
Eosinophils Absolute: 0.3 10*3/uL (ref 0.0–0.7)
HCT: 35.7 % — ABNORMAL LOW (ref 36.0–46.0)
Hemoglobin: 12.2 g/dL (ref 12.0–15.0)
LYMPHS ABS: 2.7 10*3/uL (ref 0.7–4.0)
Lymphocytes Relative: 32 % (ref 12–46)
MCH: 31.8 pg (ref 26.0–34.0)
MCHC: 34.2 g/dL (ref 30.0–36.0)
MCV: 93 fL (ref 78.0–100.0)
Monocytes Absolute: 0.5 10*3/uL (ref 0.1–1.0)
Monocytes Relative: 6 % (ref 3–12)
Neutro Abs: 5 10*3/uL (ref 1.7–7.7)
Neutrophils Relative %: 59 % (ref 43–77)
Platelets: 221 10*3/uL (ref 150–400)
RBC: 3.84 MIL/uL — AB (ref 3.87–5.11)
RDW: 12.2 % (ref 11.5–15.5)
WBC: 8.4 10*3/uL (ref 4.0–10.5)

## 2014-01-09 LAB — WET PREP, GENITAL
CLUE CELLS WET PREP: NONE SEEN
TRICH WET PREP: NONE SEEN
WBC, Wet Prep HPF POC: NONE SEEN
YEAST WET PREP: NONE SEEN

## 2014-01-09 LAB — URINALYSIS, ROUTINE W REFLEX MICROSCOPIC
Glucose, UA: NEGATIVE mg/dL
Nitrite: POSITIVE — AB
PH: 6.5 (ref 5.0–8.0)
Protein, ur: >300 mg/dL — AB
SPECIFIC GRAVITY, URINE: 1.015 (ref 1.005–1.030)
UROBILINOGEN UA: 1 mg/dL (ref 0.0–1.0)

## 2014-01-09 LAB — URINE MICROSCOPIC-ADD ON

## 2014-01-09 LAB — HIV ANTIBODY (ROUTINE TESTING W REFLEX): HIV: NONREACTIVE

## 2014-01-09 LAB — BASIC METABOLIC PANEL
BUN: 12 mg/dL (ref 6–23)
CHLORIDE: 102 meq/L (ref 96–112)
CO2: 26 mEq/L (ref 19–32)
Calcium: 9 mg/dL (ref 8.4–10.5)
Creatinine, Ser: 0.57 mg/dL (ref 0.50–1.10)
GFR calc Af Amer: >90 mL/min (ref >90)
GFR calc non Af Amer: >90 mL/min (ref >90)
GLUCOSE: 105 mg/dL — AB (ref 70–99)
POTASSIUM: 4 meq/L (ref 3.7–5.3)
Sodium: 138 mEq/L (ref 137–147)

## 2014-01-09 LAB — PREGNANCY, URINE: Preg Test, Ur: NEGATIVE

## 2014-01-09 LAB — RPR: RPR Ser Ql: NONREACTIVE

## 2014-01-09 MED ORDER — KETOROLAC TROMETHAMINE 60 MG/2ML IM SOLN
60.0000 mg | Freq: Once | INTRAMUSCULAR | Status: DC
  Filled 2014-01-09: qty 2

## 2014-01-09 MED ORDER — OXYCODONE-ACETAMINOPHEN 5-325 MG PO TABS
2.0000 | ORAL_TABLET | Freq: Once | ORAL | Status: AC
  Administered 2014-01-09: 2 via ORAL
  Filled 2014-01-09: qty 2

## 2014-01-09 MED ORDER — CEPHALEXIN 500 MG PO CAPS: 500.0000 mg | ORAL_CAPSULE | Freq: Four times a day (QID) | ORAL | Status: DC

## 2014-01-09 MED ORDER — CEPHALEXIN 500 MG PO CAPS
500.0000 mg | ORAL_CAPSULE | Freq: Once | ORAL | Status: AC
  Administered 2014-01-09: 500 mg via ORAL
  Filled 2014-01-09: qty 1

## 2014-01-09 MED ORDER — OXYCODONE-ACETAMINOPHEN 5-325 MG PO TABS: 1.0000 | ORAL_TABLET | ORAL | Status: DC | PRN

## 2014-01-09 MED ORDER — NAPROXEN 500 MG PO TABS: 500.0000 mg | ORAL_TABLET | Freq: Two times a day (BID) | ORAL | Status: DC

## 2014-01-09 NOTE — Discharge Instructions (Signed)
Your testing shows that you have a urinary infection. Take Keflex 4 times a day for the next 7 days, pain medications as prescribed, return in the morning for the ultrasound. See the list below for family Drs.  [facility]    Kari BaarsEdward Hawkins MD. Specialty: Pulmonary Disease Contact information: [address]  [address]  [place] [address]  [phone]   Syliva OvermanMargaret Simpson, MD. Specialty: [facility] Contact information: [address]  [place] [address]  [phone]   Lilyan PuntScott Luking, MD. Specialty: [facility] Contact information: [address]  [address]  [place] [address]  [phone]   Avon Gullyesfaye Fanta, MD Specialty: Internal Medicine Contact information: [address]  [place] [address]  [phone]   Catalina PizzaZach Hall, MD. Specialty: Internal Medicine Contact information: [address]  [place] [address]  [phone]   Butch PennyAngus Mcinnis, MD. Specialty: Family Medicine Contact information: [address]  [place] [address]  [phone]   John GiovanniStephen Knowlton, MD. Specialty: [facility] Contact information: [address]  [address]  [place] [address]  [phone]   Carylon Perchesoy Fagan, MD. Specialty: Internal Medicine Contact information: [address]  [address]  [place] [address]  [phone]

## 2014-01-09 NOTE — ED Provider Notes (Signed)
CSN: [id number]     Arrival date & time 01/09/14  0013 History   First MD Initiated Contact with Patient 01/09/14 [phone]     Chief Complaint  Patient presents with  . Abdominal Pain   (Consider location/radiation/quality/duration/timing/severity/associated sxs/prior Treatment) HPI Comments:  Patient is a 23 year old female who has a history of ovarian cysts, currently uses the implant on for birth control, denies having sexual intercourse in over 6 months. She presents with a complaint of lower abdominal pain and vaginal bleeding.  #1 vaginal bleeding has been present for approximately 2 months, has been a small amount most of that time but over the last 2 days has had increased vaginal bleeding soaking through tampons and her clothing while sleeping last night. Nothing seems to make this better or worse, she denies being pregnant, she denies recent sexual activity, she denies trauma.  #2 abdominal pain: The patient states that she started having abdominal pain yesterday, the pain is located in the lower abdomen left lower quadrant greater than right lower quadrant, relatively persistent throughout the day, sharp and stabbing, does not get worse with urinating or bowel movements, no abnormal bowel movements. She denies dysuria but has noted some hematuria when she gave a urine sample here. She also endorses some left lower back pain. There is no fevers, no chills and no nausea or vomiting. She has had ovarian cysts in the past and has had urinary infections in the past as well. She has had a cholecystectomy.  Patient is a 23 y.o. female presenting with abdominal pain. The history is provided by the patient.  Abdominal Pain   Past Medical History  Diagnosis Date  . Ovarian cyst    Past Surgical History  Procedure Laterality Date  . Cholecystectomy  07/31/2011    Procedure: LAPAROSCOPIC CHOLECYSTECTOMY;  Surgeon: Fabio Bering;  Location: [facility];  Service: General;  Laterality: N/A;   Family  History  Problem Relation Age of Onset  . Hypertension Other   . Diabetes Other   . Anesthesia problems Other   . Malignant hyperthermia Other    History  Substance Use Topics  . Smoking status: Current Every Day Smoker -- 1.00 packs/day    Types: Cigarettes  . Smokeless tobacco: Not on file  . Alcohol Use: 0.6 oz/week    1 Cans of beer per week     Comment: occasional   OB History   Grav Para Term Preterm Abortions TAB SAB Ect Mult Living                 Review of Systems  Gastrointestinal: Positive for abdominal pain.  All other systems reviewed and are negative.    Allergies  Codeine  Home Medications   Current Outpatient Rx  Name  Route  Sig  Dispense  Refill  . Etonogestrel (IMPLANON [place])   Subcutaneous   Inject 1 each into the skin once.           Marland Kitchen ibuprofen (ADVIL,MOTRIN) 800 MG tablet   Oral   Take 1 tablet (800 mg total) by mouth 3 (three) times daily.   21 tablet   0   . amoxicillin (AMOXIL) 500 MG capsule   Oral   Take 1 capsule (500 mg total) by mouth 3 (three) times daily.   21 capsule   0   . cephALEXin (KEFLEX) 500 MG capsule   Oral   Take 1 capsule (500 mg total) by mouth 4 (four) times daily.   28 capsule  0   . HYDROcodone-acetaminophen (NORCO/VICODIN) 5-325 MG per tablet   Oral   Take 1-2 tablets by mouth every 6 (six) hours as needed for pain.   14 tablet   0   . naproxen (NAPROSYN) 500 MG tablet   Oral   Take 1 tablet (500 mg total) by mouth 2 (two) times daily.   14 tablet   0   . naproxen (NAPROSYN) 500 MG tablet   Oral   Take 1 tablet (500 mg total) by mouth 2 (two) times daily with a meal.   30 tablet   0   . oxyCODONE-acetaminophen (PERCOCET) 5-325 MG per tablet   Oral   Take 1 tablet by mouth every 4 (four) hours as needed.   20 tablet   0    BP 88/64  Pulse 109  Temp(Src) 98.7 F (37.1 C) (Oral)  Resp 17  Ht 5\' 2"  (1.575 m)  Wt 105 lb (47.628 kg)  BMI 19.20 kg/m2  SpO2 100%  LMP  01/09/2014 Physical Exam  Nursing note and vitals reviewed. Constitutional: She appears well-developed and well-nourished. No distress.  HENT:  Head: Normocephalic and atraumatic.  Mouth/Throat: Oropharynx is clear and moist. No oropharyngeal exudate.  Eyes: Conjunctivae and EOM are normal. Pupils are equal, round, and reactive to light. Right eye exhibits no discharge. Left eye exhibits no discharge. No scleral icterus.  Neck: Normal range of motion. Neck supple. No JVD present. No thyromegaly present.  Cardiovascular: Normal rate, regular rhythm, normal heart sounds and intact distal pulses.  Exam reveals no gallop and no friction rub.   No murmur heard. Pulmonary/Chest: Effort normal and breath sounds normal. No respiratory distress. She has no wheezes. She has no rales.  Abdominal: Soft. Bowel sounds are normal. She exhibits no distension and no mass. There is tenderness ( Tender with guarding in the left adnexa and lower quadrant and suprapubic area, mild tenderness in the right lower quadrant, no upper abdominal tenderness).  Mild L CVA ttp  Genitourinary:  Chaperone present for pelvic exam, vaginal bleeding present, normal external genitalia, minimal suprapubic tenderness, no cervical motion tenderness, bilateral mild adnexal tenderness, no masses palpated, no discharge, no foul odor  Musculoskeletal: Normal range of motion. She exhibits no edema and no tenderness.  Lymphadenopathy:    She has no cervical adenopathy.  Neurological: She is alert. Coordination normal.  Skin: Skin is warm and dry. No rash noted. No erythema.  Psychiatric: She has a normal mood and affect. Her behavior is normal.    ED Course  Procedures (including critical care time) Labs Review Labs Reviewed  URINALYSIS, ROUTINE W REFLEX MICROSCOPIC - Abnormal; Notable for the following:    Color, Urine RED (*)    APPearance CLOUDY (*)    Hgb urine dipstick LARGE (*)    Bilirubin Urine MODERATE (*)    Ketones,  ur TRACE (*)    Protein, ur >300 (*)    Nitrite POSITIVE (*)    Leukocytes, UA MODERATE (*)    All other components within normal limits  CBC WITH DIFFERENTIAL - Abnormal; Notable for the following:    RBC 3.84 (*)    HCT 35.7 (*)    All other components within normal limits  BASIC METABOLIC PANEL - Abnormal; Notable for the following:    Glucose, Bld 105 (*)    All other components within normal limits  URINE MICROSCOPIC-ADD ON - Abnormal; Notable for the following:    Squamous Epithelial / LPF MANY (*)  Bacteria, UA MANY (*)    All other components within normal limits  WET PREP, GENITAL  GC/CHLAMYDIA PROBE AMP  PREGNANCY, URINE  RPR  HIV ANTIBODY (ROUTINE TESTING)   Imaging Review No results found.  EKG Interpretation   None       MDM   1. UTI (lower urinary tract infection)   2. Pelvic pain   3. Vaginal bleeding    The patient has an exam consistent with a possible UTI, her urine sample appears like gross hematuria, very dark, turbid. She has mild left CVA tenderness, left adnexal and suprapubic tenderness. Would also consider pregnancy with ruptured ectopic, consider kidney stone though less likely, consider ovarian cyst with possible rupture. Labs pending, pain medication, pelvic exam.  At this time the patient has improved with medications, antibiotics given for urinary infection, otherwise lungs appear unremarkable and the patient appears stable for discharge.   Meds given in ED:  Medications  ketorolac (TORADOL) injection 60 mg (60 mg Intramuscular Not Given 01/09/14 0102)  oxyCODONE-acetaminophen (PERCOCET/ROXICET) 5-325 MG per tablet 2 tablet (2 tablets Oral Given 01/09/14 0146)  cephALEXin (KEFLEX) capsule 500 mg (500 mg Oral Given 01/09/14 0146)    New Prescriptions   CEPHALEXIN (KEFLEX) 500 MG CAPSULE    Take 1 capsule (500 mg total) by mouth 4 (four) times daily.   NAPROXEN (NAPROSYN) 500 MG TABLET    Take 1 tablet (500 mg total) by mouth 2 (two)  times daily with a meal.   OXYCODONE-ACETAMINOPHEN (PERCOCET) 5-325 MG PER TABLET    Take 1 tablet by mouth every 4 (four) hours as needed.      Vida Roller, MD 01/09/14 [phone]

## 2014-01-09 NOTE — ED Notes (Signed)
Pt c/o vaginal bleeding x 2 months and lower abd pain started yesterday.

## 2014-01-10 LAB — GC/CHLAMYDIA PROBE AMP
CT PROBE, AMP APTIMA: NEGATIVE
GC Probe RNA: NEGATIVE

## 2014-02-18 ENCOUNTER — Emergency Department (HOSPITAL_COMMUNITY)
Admission: EM | Admit: 2014-02-18 | Discharge: 2014-02-18 | Disposition: A | Discharge status: Home / Self Care | Payer: Self-pay | Attending: Emergency Medicine | Admitting: Emergency Medicine

## 2014-02-18 ENCOUNTER — Emergency Department (HOSPITAL_COMMUNITY): Payer: Self-pay

## 2014-02-18 ENCOUNTER — Encounter (HOSPITAL_COMMUNITY): Payer: Self-pay | Admitting: Emergency Medicine

## 2014-02-18 DIAGNOSIS — G8929 Other chronic pain: Secondary | ICD-10-CM | POA: Insufficient documentation

## 2014-02-18 DIAGNOSIS — Z3202 Encounter for pregnancy test, result negative: Secondary | ICD-10-CM | POA: Insufficient documentation

## 2014-02-18 DIAGNOSIS — R112 Nausea with vomiting, unspecified: Secondary | ICD-10-CM

## 2014-02-18 DIAGNOSIS — Z8742 Personal history of other diseases of the female genital tract: Secondary | ICD-10-CM | POA: Insufficient documentation

## 2014-02-18 DIAGNOSIS — Z79899 Other long term (current) drug therapy: Secondary | ICD-10-CM | POA: Insufficient documentation

## 2014-02-18 DIAGNOSIS — Y92410 Unspecified street and highway as the place of occurrence of the external cause: Secondary | ICD-10-CM | POA: Insufficient documentation

## 2014-02-18 DIAGNOSIS — G8911 Acute pain due to trauma: Secondary | ICD-10-CM | POA: Insufficient documentation

## 2014-02-18 DIAGNOSIS — S298XXA Other specified injuries of thorax, initial encounter: Secondary | ICD-10-CM | POA: Insufficient documentation

## 2014-02-18 DIAGNOSIS — Y9389 Activity, other specified: Secondary | ICD-10-CM | POA: Insufficient documentation

## 2014-02-18 DIAGNOSIS — F17200 Nicotine dependence, unspecified, uncomplicated: Secondary | ICD-10-CM | POA: Insufficient documentation

## 2014-02-18 DIAGNOSIS — R0789 Other chest pain: Secondary | ICD-10-CM

## 2014-02-18 HISTORY — DX: Other chronic pain: G89.29

## 2014-02-18 HISTORY — DX: Dorsalgia, unspecified: M54.9

## 2014-02-18 HISTORY — DX: Pelvic and perineal pain: R10.2

## 2014-02-18 LAB — URINALYSIS, ROUTINE W REFLEX MICROSCOPIC
BILIRUBIN URINE: NEGATIVE
GLUCOSE, UA: NEGATIVE mg/dL
Ketones, ur: NEGATIVE mg/dL
Leukocytes, UA: NEGATIVE
Nitrite: NEGATIVE
PROTEIN: NEGATIVE mg/dL
Specific Gravity, Urine: 1.01 (ref 1.005–1.030)
UROBILINOGEN UA: 0.2 mg/dL (ref 0.0–1.0)
pH: 6 (ref 5.0–8.0)

## 2014-02-18 LAB — PREGNANCY, URINE: Preg Test, Ur: NEGATIVE

## 2014-02-18 LAB — URINE MICROSCOPIC-ADD ON

## 2014-02-18 MED ORDER — TRAMADOL HCL 50 MG PO TABS
50.0000 mg | ORAL_TABLET | Freq: Once | ORAL | Status: AC
  Administered 2014-02-18: 50 mg via ORAL
  Filled 2014-02-18: qty 1

## 2014-02-18 MED ORDER — ONDANSETRON 8 MG PO TBDP
8.0000 mg | ORAL_TABLET | Freq: Once | ORAL | Status: AC
  Administered 2014-02-18: 8 mg via ORAL
  Filled 2014-02-18: qty 1

## 2014-02-18 MED ORDER — PROMETHAZINE HCL 25 MG PO TABS: 25.0000 mg | ORAL_TABLET | Freq: Four times a day (QID) | ORAL | Status: DC | PRN
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
## 2014-02-18 NOTE — Discharge Instructions (Signed)
°Emergency Department Resource Guide °1) Find a Doctor and Pay Out of Pocket °Although you won't have to find out who is covered by your insurance plan, it is a good idea to ask around and get recommendations. You will then need to call the office and see if the doctor you have chosen will accept you as a new patient and what types of options they offer for patients who are self-pay. Some doctors offer discounts or will set up payment plans for their patients who do not have insurance, but you will need to ask so you aren't surprised when you get to your appointment. ° °2) Contact Your Local Health Department °Not all health departments have doctors that can see patients for sick visits, but many do, so it is worth a call to see if yours does. If you don't know where your local health department is, you can check in your phone book. The CDC also has a tool to help you locate your state's health department, and many state websites also have listings of all of their local health departments. ° °3) Find a Walk-in Clinic °If your illness is not likely to be very severe or complicated, you may want to try a walk in clinic. These are popping up all over the country in pharmacies, drugstores, and shopping centers. They're usually staffed by nurse practitioners or physician assistants that have been trained to treat common illnesses and complaints. They're usually fairly quick and inexpensive. However, if you have serious medical issues or chronic medical problems, these are probably not your best option. ° °No Primary Care Doctor: °- Call [facility] at  832-8000 - they can help you locate a primary care doctor that  accepts your insurance, provides certain services, etc. °- Physician Referral Service- 1-800-533-3463 ° °Chronic Pain Problems: °Organization         Address  Phone   Notes  °[facility]  (336) 297-2271 Patients need to be referred by their primary care doctor.  ° °Medication  Assistance: °Organization         Address  Phone   Notes  °[place] [facility] [address] °[place], [place] [address] (336) 641-8030 --Must be a resident of [place] °-- Must have NO insurance coverage whatsoever (no Medicaid/ Medicare, etc.) °-- The pt. MUST have a primary care doctor that directs their care regularly and follows them in the community °  °[facility]  (866) 331-1348   °[employer]  (888) 892-1162   ° °Agencies that provide inexpensive medical care: °Organization         Address  Phone   Notes  °[facility]  (336) 832-8035   °[facility]    (336) 832-7272   °[facility] [address] °[place], [place] [address] (336) 832-4777   °[facility] [address], °[place] (336) 271-4999   °[facility]    (336) 373-0678   °[facility]    (336) 272-1050   °[facility] ° [address], [place] Phone:  (336) 832-4444, Fax:  (336) 832-4440 Hours of Operation:  9 am - 6 pm, M-F.  Also accepts Medicaid/Medicare and self-pay.  °[facility] ° [address], [place] Phone: (336) 832-3150, Fax: (336) [phone]. Hours of Operation:  8:30 am - 5:30 pm, M-F.  Also accepts Medicaid and self-pay.  °[facility] [place] [address], [place] Phone: (336) 878-6027   °[facility] [address], [place], [place] (336)723-1848, Ext. 123 Mondays & Thursdays: 7-9 AM.  First 15 patients are seen on a first come, first serve basis. °  ° °Medicaid-accepting [place] Providers: ° °Organization         Address  Phone   Notes  °[facility] [address], [place] (336) 641-2100 Also accepts self-pay patients.  °[facility] [address], [place] ° (336) 856-9996   °[facility] [address], [place]  (336) 288-8857   °[facility] [address] [place] [address], [place] (336) 299-7000   °Veita Bland [address], [place]  ° (336) 373-1557 Only accepts [place] Access Medicaid patients after they have their name applied to their card.  ° °Self-Pay (no insurance) in [place]: ° °Organization         Address  Phone   Notes  °Sickle Cell Patients, [facility] [address], [place] (336) 832-1970   °[facility] [address], [place] (336) 832-4400   °[facility] ° [address], [place] (336) 992-4800   °[facility]/Dr. Osei-Bonsu ° [address] [place] [address], [place] or [address], [place] (336) 841-8500 Phone number for both [place] and [place] locations is the same.  °[facility] [address], [place] (336) 299-0000   °[facility] [address] [place] [address], [place] or [address] (336) 852-7530 °(336) 878-2260   °[facility] [address], [place] (336) 350-1642, phone; (336) 294-5005, fax Sees patients 1st and 3rd Saturday of every month.  Must not qualify for public or private insurance (i.e. Medicaid, Medicare, [place] Health Choice, Veterans' Benefits) • Household income should be no more than 200% of the poverty level •The clinic cannot treat you if you are pregnant or think you are pregnant • Sexually transmitted diseases are not treated at the clinic.  ° ° °Dental Care: °Organization         Address  Phone  Notes  °[place] [facility] [address], [place] (336) 641-6152 Accepts children up to age 21 who are enrolled in Medicaid or [place] Health Choice; pregnant women with a Medicaid card; and children who have applied for Medicaid or [place] Health Choice, but were declined, whose parents can pay a reduced fee at time of service.  °[place]  [facility] [place]  [address], [place] (336) 641-7733 Accepts children up to age 21 who are enrolled in Medicaid or [place] Health Choice; pregnant women with a Medicaid card; and children who have applied for Medicaid or [place] Health Choice, but were declined, whose parents can pay a reduced fee at time of service.  °[facility] ° [address], [place] (336) 641-4533 Patients are seen by appointment only. Walk-ins are not accepted. [facility] will see patients 18 years of age and older. °Monday - Tuesday (8am-5pm) °Most Wednesdays (8:30-5pm) °$30 per visit, cash only  °[facility] ° [address], [place] (336) 641-4533 Patients are seen by appointment only. Walk-ins are not accepted. [facility] will see patients 18 years of age and older. °One   Wednesday Evening (Monthly: Volunteer Based).  $30 per visit, cash only  °[facility]  (919) 537-3737 for adults; Children under age 4, call Graduate Pediatric Dentistry at ([phone]) [phone]. Children aged 4-14, please call (919) [phone] to request a pediatric application. ° Dental services are provided in all areas of dental care including fillings, crowns and bridges, complete and partial dentures, implants, gum treatment, root canals, and extractions. Preventive care is also provided. Treatment is provided to both adults and children. °Patients are selected via a lottery and there is often a waiting list. °  °[facility] [address], °[place] ° (336) 763-8833 [email or web address] °  °[facility] [address], [place], [place] (336)723-1848, Ext. 123 Second and Fourth Thursday of each month, opens at 6:30 AM; Clinic ends at 9 AM.  Patients are seen on a first-come first-served basis, and a limited number are seen during each clinic.  ° °[facility] ° [address], [place], [place] (336) 723-7904    Eligibility Requirements °You must have lived in [place], [place], or [place] counties for at least the last three months. °  You cannot be eligible for state or federal sponsored healthcare insurance, including Veterans Administration, Medicaid, or Medicare. °  You generally cannot be eligible for healthcare insurance through your employer.  °  How to apply: °Eligibility screenings are held every Tuesday and Wednesday afternoon from 1:00 pm until 4:00 pm. You do not need an appointment for the interview!  °[facility] [address], [place], [place] 336-631-2330   °[facility]  336-342-8273   °[facility]  336-703-3100   °[facility]  336-570-6415   ° °Behavioral Health Resources in the Community: °Intensive Outpatient Programs °Organization         Address  Phone  Notes  °[facility] [address], [place], [place] 336-878-6098   °[facility] [address], [place], [place] 336-832-9800   °[facility] [address], [place], [place] ° 336-882-2125   °[facility] [address],  °[place], [place] 1-800-853-5163 or 336-641-4981   °Substance Abuse Resources °Organization         Address  Phone  Notes  °[facility]  336-882-2125   °[facility]  336-784-9470   °[facility]  336-285-9073   °[facility]  336-845-3988   °Residential & Outpatient Substance Abuse Program  1-800-659-3381   °Psychological Services °Organization         Address  Phone  Notes  °[facility]  336- 832-9600   °[facility]  336- 378-7881   °[facility] [address], [place] 1-800-853-5163 or 336-641-4981   ° °Mobile Crisis Teams °Organization         Address  Phone  Notes  °[facility], Mobile Crisis Care Unit  1-877-626-1772   °Assertive °[facility] ° [address]  [place], [place] 336-834-9664   °Sharon DeEsch [address] °[place] 336-554-5454   ° °Self-Help/Support Groups °Organization         Address  Phone             Notes  °[facility]  - variety of support groups  336- 373-1402 Call for more information  °Narcotics Anonymous (NA), [facility] [address], °[place]  2 meetings at this location  ° °  Residential Treatment Programs Organization         Address  Phone  Notes  [facility] [address],    [place]  [phone]   [facility]  [address], [place] [id number], [place], [place] [phone]   [facility] [address], [place] [phone] Admissions: 8am-3pm M-F  [facility] [address]    [place], [place] [phone]   [facility] [address], [place], [place] [phone]   [facility] [address]  [place], [place] [phone]   [facility] Intensive Outpatient [address], [place], [place] [phone]   [facility] ([facility] [address]  [place], [place] [phone] or (562)[phone](317)[phone]   [facility] ([facility]) [address] [place], [place] [phone] Accepts Medicaid  [facility] [address]  [place] [phone] Substance Abuse/Addiction Treatment   [facility]         Address  Phone  Notes  [facility]  (718) 414-6324(888) 337-616-3453   Angie FavaJulie Brannon, PhD [address], [address] [place], [place]   [phone](336) [phone] or [phone](336) [phone]   [facility]   [address] [place], [place] [phone](336) [phone]   [facility] [address], [place], [place] (641)[phone](336) [phone] Insurance/Medicaid/sponsorship through [facility] [address]                                    [place], [place] [phone](336) [phone] Therapy/tele-psych/case    [facility] [address]   [place], [place] [phone](336) [phone]    Dr. Lolly MustacheArfeen  [phone](336) [phone]   [facility]  [employer] [facility] 1) [address], [place] 2) [address], [place] 3)  [address], [place] ([phone]) [phone](336) [phone](336) [phone]  [phone](336) [phone]   [facility] Child Abuse Hotline 606-039-4602(336) (573)[phone] or (813)[phone](336) [phone] (After Hours)       Take the prescription as directed. Take over the counter tylenol and ibuprofen, as directed on packaging, as needed for discomfort.  Apply moist heat or ice to the area(s) of discomfort, for 15 minutes at a time, several times per day for the next few days.  Do not fall asleep on a heating or ice pack.  Increase your fluid intake (ie:  Gatoraide) for the next few days.  Eat a bland diet and advance to your regular diet slowly as you can tolerate it.  Call your regular medical doctor tomorrow morning to schedule a follow up appointment in the next 2 days.  Return to the Emergency Department immediately if not improving (or even worsening) despite taking the medicines as prescribed, any black or bloody stool or vomit, if you develop a fever over "101," or for any other concerns.

## 2014-02-18 NOTE — ED Notes (Signed)
Patient made comment, "Tramadol doesn't work for pain."

## 2014-08-21 ENCOUNTER — Other Ambulatory Visit (HOSPITAL_COMMUNITY)
Admission: RE | Admit: 2014-08-21 | Discharge: 2014-08-21 | Disposition: A | Discharge status: Home / Self Care | Payer: BC Managed Care – PPO | Source: Ambulatory Visit | Attending: Advanced Practice Midwife | Admitting: Advanced Practice Midwife

## 2014-08-21 ENCOUNTER — Encounter: Payer: Self-pay | Admitting: Advanced Practice Midwife

## 2014-08-21 ENCOUNTER — Ambulatory Visit (INDEPENDENT_AMBULATORY_CARE_PROVIDER_SITE_OTHER): Payer: BC Managed Care – PPO | Admitting: Advanced Practice Midwife

## 2014-08-21 VITALS — BP 92/56 | Ht 62.0 in | Wt 96.0 lb

## 2014-08-21 DIAGNOSIS — G8929 Other chronic pain: Secondary | ICD-10-CM | POA: Insufficient documentation

## 2014-08-21 DIAGNOSIS — R350 Frequency of micturition: Secondary | ICD-10-CM

## 2014-08-21 DIAGNOSIS — Z124 Encounter for screening for malignant neoplasm of cervix: Secondary | ICD-10-CM | POA: Insufficient documentation

## 2014-08-21 DIAGNOSIS — R102 Pelvic and perineal pain: Secondary | ICD-10-CM

## 2014-08-21 DIAGNOSIS — M549 Dorsalgia, unspecified: Secondary | ICD-10-CM

## 2014-08-21 DIAGNOSIS — Z3202 Encounter for pregnancy test, result negative: Secondary | ICD-10-CM

## 2014-08-21 DIAGNOSIS — Z113 Encounter for screening for infections with a predominantly sexual mode of transmission: Secondary | ICD-10-CM | POA: Diagnosis present

## 2014-08-21 DIAGNOSIS — Z01419 Encounter for gynecological examination (general) (routine) without abnormal findings: Secondary | ICD-10-CM

## 2014-08-21 LAB — POCT URINALYSIS DIPSTICK
GLUCOSE UA: NEGATIVE
KETONES UA: NEGATIVE
Leukocytes, UA: NEGATIVE
Nitrite, UA: NEGATIVE
Protein, UA: NEGATIVE
RBC UA: NEGATIVE

## 2014-08-21 LAB — POCT URINE PREGNANCY: PREG TEST UR: NEGATIVE

## 2014-08-21 NOTE — Progress Notes (Signed)
Rebekah [name] 22 y.o.  Filed Vitals:   08/21/14 1435  BP: 92/56     Past Medical History: Past Medical History  Diagnosis Date  . Ovarian cyst   . Chronic back pain   . Chronic pelvic pain in female     Past Surgical History: Past Surgical History  Procedure Laterality Date  . Cholecystectomy  07/31/2011    Procedure: LAPAROSCOPIC CHOLECYSTECTOMY;  Surgeon: Fabio Bering;  Location: [facility];  Service: General;  Laterality: N/A;    Family History: Family History  Problem Relation Age of Onset  . Hypertension Other   . Diabetes Other   . Anesthesia problems Other   . Malignant hyperthermia Other   . Cancer Mother     cervical  . Cancer Maternal Aunt     cervical  . Cancer Maternal Grandmother     cervical  . Heart disease Maternal Grandfather   . Hypertension Maternal Grandfather   . Diabetes Maternal Grandfather   . Hyperlipidemia Maternal Grandfather   . Cancer Paternal Grandmother     cervial, breast  . Diabetes Paternal Grandmother   . Hypertension Paternal Grandmother     Social History: History  Substance Use Topics  . Smoking status: Current Every Day Smoker -- 1.00 packs/day    Types: Cigarettes  . Smokeless tobacco: Not on file  . Alcohol Use: 0.6 oz/week    1 Cans of beer per week     Comment: occasional    Allergies:  Allergies  Allergen Reactions  . Codeine Itching      History of Present Illness: Rebekah [name] is here for her well woman exam.  She is using Nexplanon for contraception, but wants it removed because it was due to come out 2/15.  C/O pelvic pain, dyspareunia and frequent urination for about 2 weeks. Has been dx with chronic pelvic pain many years ago--states she has "ovarian cysts".    Review of Systems   Patient denies any headaches, blurred vision, shortness of breath, chest pain, abdominal pain, problems with bowel movements, urination, or intercourse.   Physical Exam: General:  Well developed, well nourished, no acute  distress Skin:  Warm and dry Neck:  Midline trachea, normal thyroid Lungs; Clear to auscultation bilaterally Breast:  No dominant palpable mass, retraction, or nipple discharge Cardiovascular: Regular rate and rhythm Abdomen:  Soft, non tender, no hepatosplenomegaly Pelvic:  External genitalia is normal in appearance.  The vagina is normal in appearance.  Normal dc/ with normal wet prep. The cervix is bulbous. Very Dramatic response to cytobrush on cx, citing extreme pain.  However, no c/o pain when touching cx with my finger and no CMT. Uterus is felt to be normal size, shape, and contour.  No adnexal masses.  C/O pain during every aspect of bimanual except cx exam  Extremities:  No swelling or varicosities noted Psych:  No mood changes.     Impression: GYN exam Chronic pelvic pain ? Origin ? vaginismus Dyspareunia with penetration  Plan: PAP with reflex HPV; if normal, pap q 3 years GC/CHL screening Consider repeat pelvic US (pt wants to wait on this d/t $)  F/U for Nexplanon removal and reinsertion

## 2014-08-23 LAB — CYTOLOGY - PAP

## 2014-09-05 ENCOUNTER — Encounter: Payer: BC Managed Care – PPO | Admitting: Advanced Practice Midwife

## 2014-09-18 ENCOUNTER — Encounter: Payer: BC Managed Care – PPO | Admitting: Advanced Practice Midwife

## 2015-01-11 IMAGING — CT CT ABD-PELV W/O CM
2 of 3 series · 9 of 46 positions shown, 11 images · non-contrast
Comparison: CT of the abdomen and pelvis 05/25/2013.

CLINICAL DATA: Left-sided abdominal pain.

CT ABDOMEN AND PELVIS WITHOUT CONTRAST
TECHNIQUE: Multidetector CT imaging of the abdomen and pelvis was
performed following the standard protocol without intravenous
contrast.

[Series 4: mpr coronal (id) · coronal · 0.57mm/px · 8 of 60 slices shown, 9 images]
[im 7/60  soft-tissue]
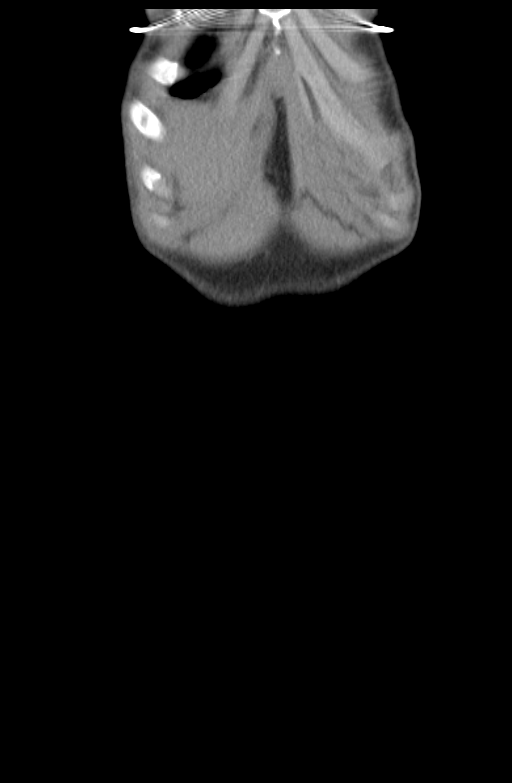
[im 7/60  bone]
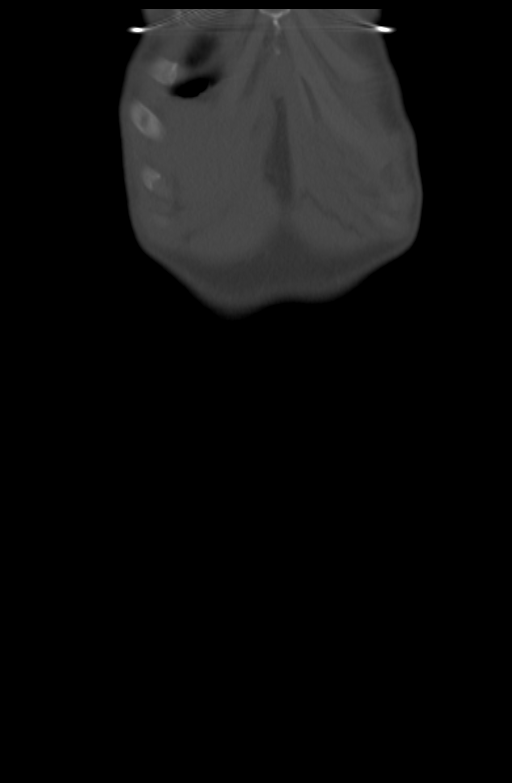
[im 14/60  soft-tissue]
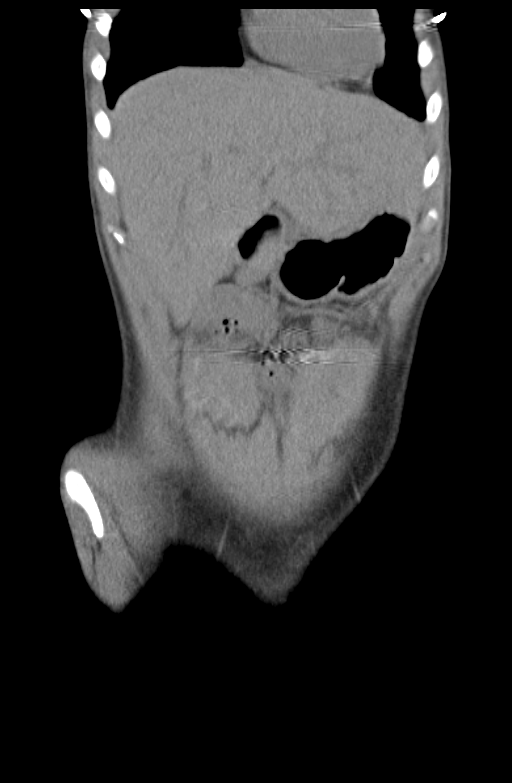
[im 20/60  soft-tissue]
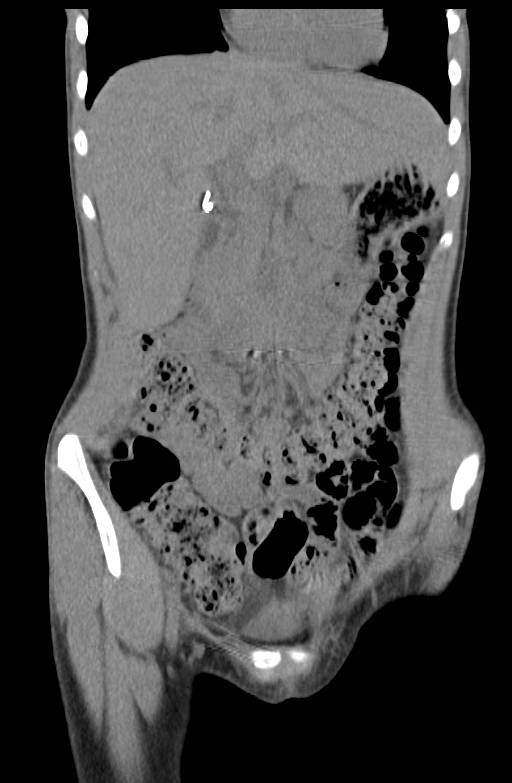
[im 27/60  soft-tissue]
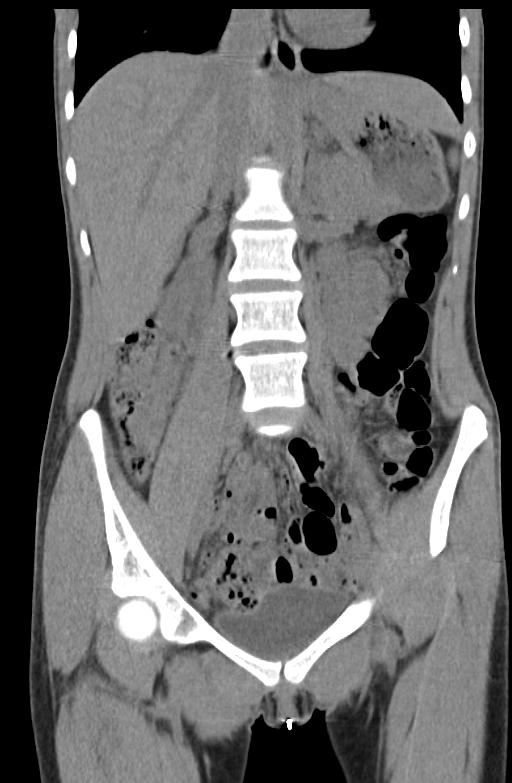
[im 33/60  soft-tissue]
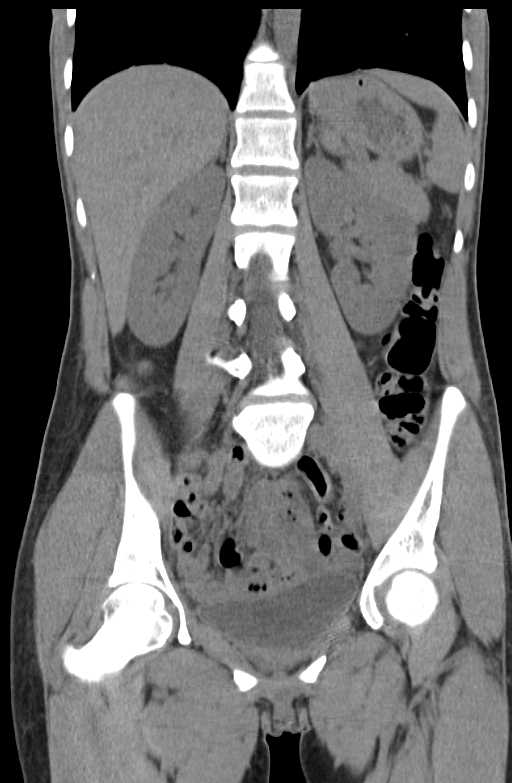
[im 40/60  soft-tissue]
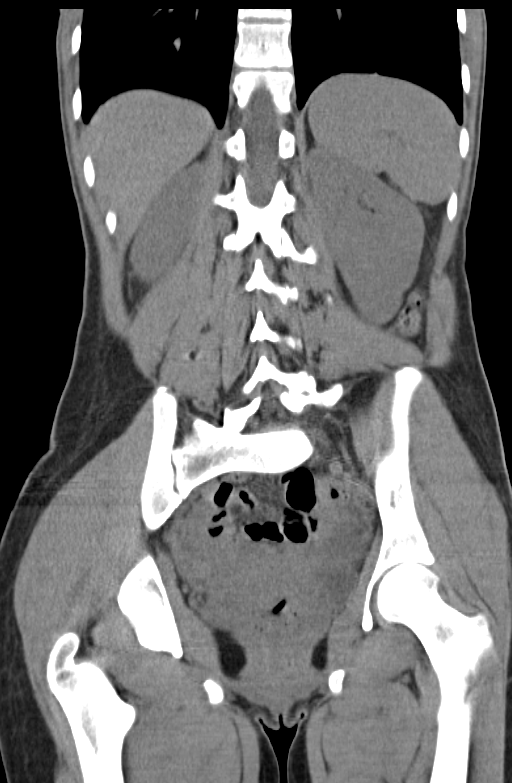
[im 46/60  soft-tissue]
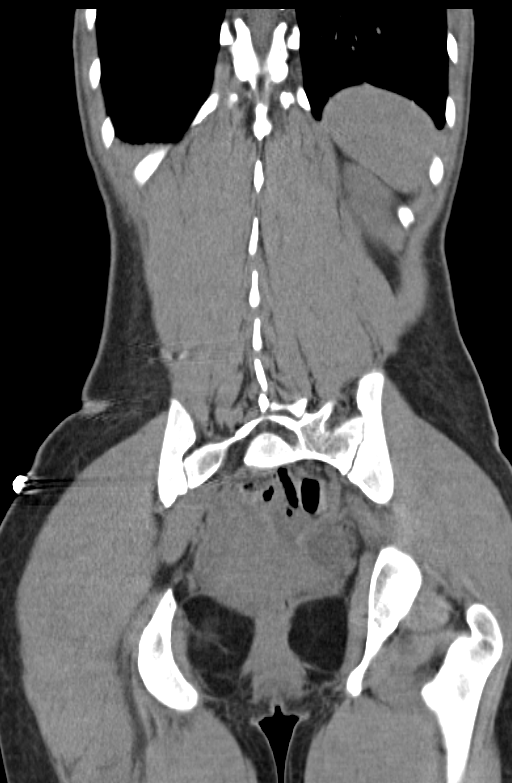
[im 53/60  soft-tissue]
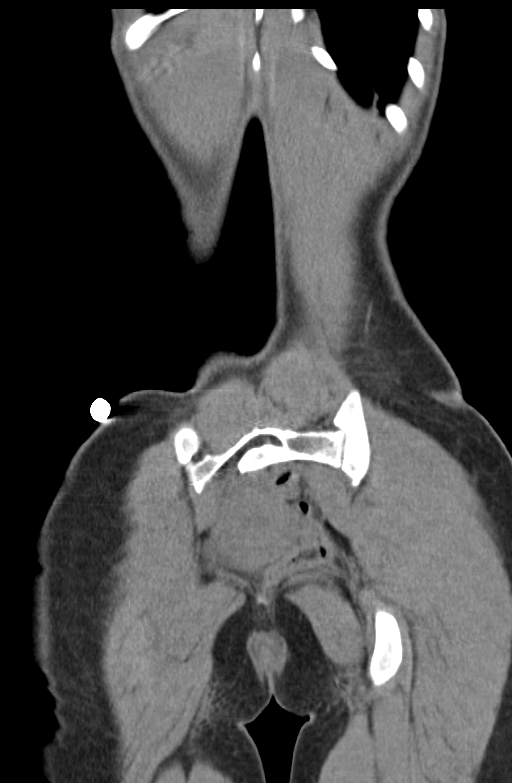

[Series 5: mpr sagittal (id) · sagittal · 0.41mm/px · 1 of 89 slices shown, 2 images]
[im 30/89  soft-tissue]
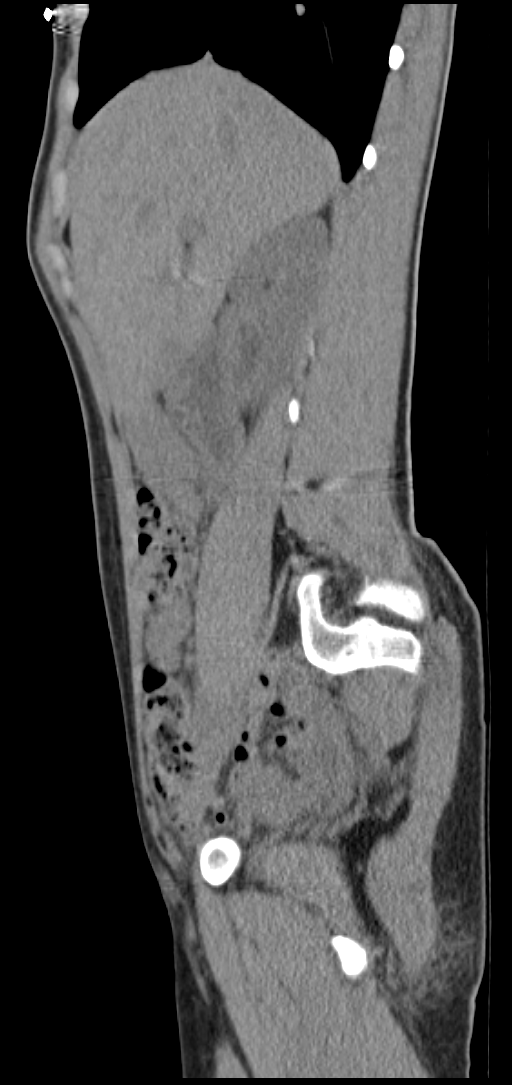
[im 30/89  bone]
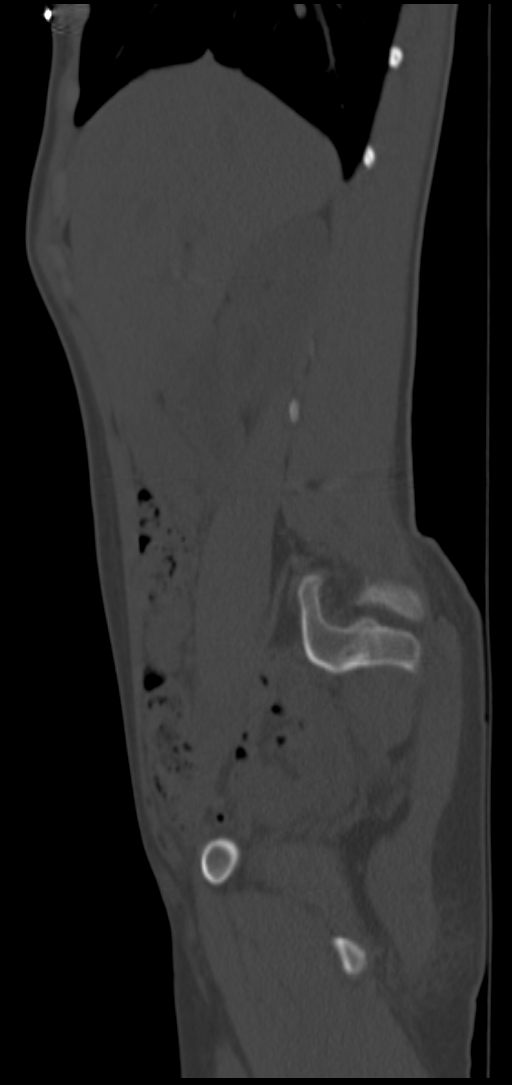

[9 of 46 positions shown; findings below may reference images not displayed]

FINDINGS: Lung Bases: Unremarkable.

Abdomen/Pelvis:  There are no abnormal calcifications within the
collecting system of either kidney, along the course of either
ureter, or within the lumen of the urinary bladder.  No
hydroureteronephrosis or perinephric stranding to suggest urinary
tract obstruction at this time.  The unenhanced appearance of the
kidneys is unremarkable bilaterally.

Status post cholecystectomy.  The unenhanced appearance of the
liver, pancreas, spleen and bilateral adrenal glands is
unremarkable.  A 3.8 x 2.1 x 2.6 cm low attenuation lesion in the
left adnexa likely represents an ovarian cyst.  Right ovary and
uterus are otherwise unremarkable.  A trace volume of free fluid in
the cul-de-sac, presumably physiologic in this young female
patient.  No larger volume of ascites.  No pneumoperitoneum.  No
pathologic distension of small bowel.  No definite pathologic
lymphadenopathy identified within the abdomen or pelvis on today's
noncontrast CT examination.  The appendix is not confidently
identified, however, no inflammatory changes are noted adjacent to
the cecum to suggest an acute appendicitis at this time.

Musculoskeletal: There are no aggressive appearing lytic or blastic
lesions noted in the visualized portions of the skeleton.
IMPRESSION: 1.  No acute findings in the abdomen or pelvis to account for the
patient's symptoms.
2.  Trace volume of free fluid the cul-de-sac is presumably
physiologic in this young female patient.
3.  Well-defined 3.8 x 2.1 x 2.6 cm low attenuation lesion in the
left adnexa likely represents an ovarian cyst.
4.  Status post cholecystectomy.

## 2015-01-30 ENCOUNTER — Encounter: Payer: Self-pay | Admitting: Adult Health

## 2015-01-30 ENCOUNTER — Ambulatory Visit (INDEPENDENT_AMBULATORY_CARE_PROVIDER_SITE_OTHER): Payer: BLUE CROSS/BLUE SHIELD | Admitting: Adult Health

## 2015-01-30 VITALS — BP 90/50 | Ht 62.0 in | Wt 97.0 lb

## 2015-01-30 DIAGNOSIS — Z309 Encounter for contraceptive management, unspecified: Secondary | ICD-10-CM | POA: Insufficient documentation

## 2015-01-30 DIAGNOSIS — Z3046 Encounter for surveillance of implantable subdermal contraceptive: Secondary | ICD-10-CM

## 2015-01-30 DIAGNOSIS — Z30018 Encounter for initial prescription of other contraceptives: Secondary | ICD-10-CM

## 2015-01-30 DIAGNOSIS — Z3049 Encounter for surveillance of other contraceptives: Secondary | ICD-10-CM

## 2015-01-30 DIAGNOSIS — Z3202 Encounter for pregnancy test, result negative: Secondary | ICD-10-CM

## 2015-01-30 HISTORY — DX: Encounter for surveillance of implantable subdermal contraceptive: Z30.46

## 2015-01-30 HISTORY — DX: Encounter for contraceptive management, unspecified: Z30.9

## 2015-01-30 LAB — POCT URINE PREGNANCY: Preg Test, Ur: NEGATIVE

## 2015-01-30 MED ORDER — NORELGESTROMIN-ETH ESTRADIOL 150-35 MCG/24HR TD PTWK: 1.0000 | MEDICATED_PATCH | TRANSDERMAL | Status: DC

## 2015-01-30 NOTE — Progress Notes (Signed)
Subjective:     Patient ID: Rebekah [name], female   DOB: 03/26/1991, 24 y.o.   MRN: [id number]  HPI Rebekah [name] is a 24 year old white female in for implanon removal and wants ortho evra patch.Has no gyn complaints, is on period now.  Review of Systems See HPI Reviewed past medical,surgical, social and family history. Reviewed medications and allergies.     Objective:   Physical Exam BP 90/50 mmHg  Ht 5\' 2"  (1.575 m)  Wt 97 lb (43.999 kg)  BMI 17.74 kg/m2  LMP 01/27/2015   UPT negative, consent signed and time out called, left arm cleansed with betadine, and injected with 1.5 cc 2% lidocaine and waited til numb.Under sterile technique a #11 blade was used to make small vertical incision, and a curved forceps was used to remove rod. Steri strips applied. Pressure dressing applied.  Assessment:     Implanon removal Contraceptive management    Plan:    Encouraged to decrease cigarettes and to try to stop Use condoms x 4 weeks, keep clean and dry x 24 hours, no heavy lifting, keep steri strips on x 72 hours, Keep pressure dressing on x 24 hours. Follow up prn problems.   Pap and physical in 1 year Rx ortho evra patch x 1 year Put patch on today Note given to return to work tomorrow, no heavy lifting x 24 hours

## 2015-01-30 NOTE — Patient Instructions (Addendum)
Use condoms x 4 weeks, keep clean and dry x 24 hours, no heavy lifting, keep steri strips on x 72 hours, Keep pressure dressing on x 24 hours. Follow up prn problems. Pap and physical in 1 year Note given for no heavy lifting x 24 hours excuse today PUT patch on to day  Ethinyl Estradiol; Norelgestromin skin patches What is this medicine? ETHINYL ESTRADIOL;NORELGESTROMIN (ETH in il es tra DYE ole; nor el JES troe min) skin patch is used as a contraceptive (birth control method). This medicine combines two types of female hormones, an estrogen and a progestin. This patch is used to prevent ovulation and pregnancy. This medicine may be used for other purposes; ask your health care provider or pharmacist if you have questions. COMMON BRAND NAME(S): Ortho Christianne Borrow What should I tell my health care provider before I take this medicine? They need to know if you have or ever had any of these conditions: -abnormal vaginal bleeding -blood vessel disease or blood clots -breast, cervical, endometrial, ovarian, liver, or uterine cancer -diabetes -gallbladder disease -heart disease or recent heart attack -high blood pressure -high cholesterol -kidney disease -liver disease -migraine headaches -stroke -systemic lupus erythematosus (SLE) -tobacco smoker -an unusual or allergic reaction to estrogens, progestins, other medicines, foods, dyes, or preservatives -pregnant or trying to get pregnant -breast-feeding How should I use this medicine? This patch is applied to the skin. Follow the directions on the prescription label. Apply to clean, dry, healthy skin on the buttock, abdomen, upper outer arm or upper torso, in a place where it will not be rubbed by tight clothing. Do not use lotions or other cosmetics on the site where the patch will go. Press the patch firmly in place for 10 seconds to ensure good contact with the skin. Change the patch every 7 days on the same day of the week for 3 weeks. You  will then have a break from the patch for 1 week, after which you will apply a new patch. Do not use your medicine more often than directed. Contact your pediatrician regarding the use of this medicine in children. Special care may be needed. This medicine has been used in female children who have started having menstrual periods. A patient package insert for the product will be given with each prescription and refill. Read this sheet carefully each time. The sheet may change frequently. Overdosage: If you think you have taken too much of this medicine contact a poison control center or emergency room at once. NOTE: This medicine is only for you. Do not share this medicine with others. What if I miss a dose? You will need to replace your patch once a week as directed. If your patch is lost or falls off, contact your health care professional for advice. You may need to use another form of birth control if your patch has been off for more than 1 day. What may interact with this medicine? -acetaminophen -antibiotics or medicines for infections, especially rifampin, rifabutin, rifapentine, and griseofulvin, and possibly penicillins or tetracyclines -aprepitant -ascorbic acid (vitamin C) -atorvastatin -barbiturate medicines, such as phenobarbital -bosentan -carbamazepine -caffeine -clofibrate -cyclosporine -dantrolene -doxercalciferol -felbamate -grapefruit juice -hydrocortisone -medicines for anxiety or sleeping problems, such as diazepam or temazepam -medicines for diabetes, including pioglitazone -modafinil -mycophenolate -nefazodone -oxcarbazepine -phenytoin -prednisolone -ritonavir or other medicines for HIV infection or AIDS -rosuvastatin -selegiline -soy isoflavones supplements -St. John's wort -tamoxifen or raloxifene -theophylline -thyroid hormones -topiramate -warfarin This list may not describe all possible interactions. Give  your health care provider a list of all the  medicines, herbs, non-prescription drugs, or dietary supplements you use. Also tell them if you smoke, drink alcohol, or use illegal drugs. Some items may interact with your medicine. What should I watch for while using this medicine? Visit your doctor or health care professional for regular checks on your progress. You will need a regular breast and pelvic exam and Pap smear while on this medicine. Use an additional method of contraception during the first cycle that you use this patch. If you have any reason to think you are pregnant, stop using this medicine right away and contact your doctor or health care professional. If you are using this medicine for hormone related problems, it may take several cycles of use to see improvement in your condition. Smoking increases the risk of getting a blood clot or having a stroke while you are using hormonal birth control, especially if you are more than 26 years old. You are strongly advised not to smoke. This medicine can make your body retain fluid, making your fingers, hands, or ankles swell. Your blood pressure can go up. Contact your doctor or health care professional if you feel you are retaining fluid. This medicine can make you more sensitive to the sun. Keep out of the sun. If you cannot avoid being in the sun, wear protective clothing and use sunscreen. Do not use sun lamps or tanning beds/booths. If you wear contact lenses and notice visual changes, or if the lenses begin to feel uncomfortable, consult your eye care specialist. In some women, tenderness, swelling, or minor bleeding of the gums may occur. Notify your dentist if this happens. Brushing and flossing your teeth regularly may help limit this. See your dentist regularly and inform your dentist of the medicines you are taking. If you are going to have elective surgery or a MRI, you may need to stop using this medicine before the surgery or MRI. Consult your health care professional for  advice. This medicine does not protect you against HIV infection (AIDS) or any other sexually transmitted diseases. What side effects may I notice from receiving this medicine? Side effects that you should report to your doctor or health care professional as soon as possible: -breast tissue changes or discharge -changes in vaginal bleeding during your period or between your periods -chest pain -coughing up blood -dizziness or fainting spells -headaches or migraines -leg, arm or groin pain -severe or sudden headaches -stomach pain (severe) -sudden shortness of breath -sudden loss of coordination, especially on one side of the body -speech problems -symptoms of vaginal infection like itching, irritation or unusual discharge -tenderness in the upper abdomen -vomiting -weakness or numbness in the arms or legs, especially on one side of the body -yellowing of the eyes or skin Side effects that usually do not require medical attention (report to your doctor or health care professional if they continue or are bothersome): -breakthrough bleeding and spotting that continues beyond the 3 initial cycles of pills -breast tenderness -mood changes, anxiety, depression, frustration, anger, or emotional outbursts -increased sensitivity to sun or ultraviolet light -nausea -skin rash, acne, or brown spots on the skin -weight gain (slight) This list may not describe all possible side effects. Call your doctor for medical advice about side effects. You may report side effects to FDA at 1-800-FDA-1088. Where should I keep my medicine? Keep out of the reach of children. Store at room temperature between 15 and 30 degrees C (59 and 86  degrees F). Keep the patch in its pouch until time of use. Throw away any unused medicine after the expiration date. Dispose of used patches properly. Since a used patch may still contain active hormones, fold the patch in half so that it sticks to itself prior to disposal.  Throw away in a place where children or pets cannot reach. NOTE: This sheet is a summary. It may not cover all possible information. If you have questions about this medicine, talk to your doctor, pharmacist, or health care provider.  2015, [employer]. (2008-11-29 78:[id number]:24)

## 2015-03-06 ENCOUNTER — Ambulatory Visit (INDEPENDENT_AMBULATORY_CARE_PROVIDER_SITE_OTHER): Payer: BLUE CROSS/BLUE SHIELD | Admitting: Adult Health

## 2015-03-06 ENCOUNTER — Encounter: Payer: Self-pay | Admitting: Adult Health

## 2015-03-06 VITALS — BP 110/58 | HR 98 | Ht 62.0 in | Wt 101.5 lb

## 2015-03-06 DIAGNOSIS — R112 Nausea with vomiting, unspecified: Secondary | ICD-10-CM | POA: Diagnosis not present

## 2015-03-06 DIAGNOSIS — Z3490 Encounter for supervision of normal pregnancy, unspecified, unspecified trimester: Secondary | ICD-10-CM

## 2015-03-06 DIAGNOSIS — Z3201 Encounter for pregnancy test, result positive: Secondary | ICD-10-CM | POA: Diagnosis not present

## 2015-03-06 DIAGNOSIS — O219 Vomiting of pregnancy, unspecified: Secondary | ICD-10-CM

## 2015-03-06 HISTORY — DX: Vomiting of pregnancy, unspecified: O21.9

## 2015-03-06 HISTORY — DX: Encounter for supervision of normal pregnancy, unspecified, unspecified trimester: Z34.90

## 2015-03-06 LAB — POCT URINE PREGNANCY: PREG TEST UR: POSITIVE

## 2015-03-06 MED ORDER — DOXYLAMINE-PYRIDOXINE 10-10 MG PO TBEC: DELAYED_RELEASE_TABLET | ORAL | Status: DC

## 2015-03-06 NOTE — Patient Instructions (Signed)
First Trimester of Pregnancy The first trimester of pregnancy is from week 1 until the end of week 12 (months 1 through 3). A week after a sperm fertilizes an egg, the egg will implant on the wall of the uterus. This embryo will begin to develop into a baby. Genes from you and your partner are forming the baby. The female genes determine whether the baby is a boy or a girl. At 6-8 weeks, the eyes and face are formed, and the heartbeat can be seen on ultrasound. At the end of 12 weeks, all the baby's organs are formed.  Now that you are pregnant, you will want to do everything you can to have a healthy baby. Two of the most important things are to get good prenatal care and to follow your health care provider's instructions. Prenatal care is all the medical care you receive before the baby's birth. This care will help prevent, find, and treat any problems during the pregnancy and childbirth. BODY CHANGES Your body goes through many changes during pregnancy. The changes vary from woman to woman.   You may gain or lose a couple of pounds at first.  You may feel sick to your stomach (nauseous) and throw up (vomit). If the vomiting is uncontrollable, call your health care provider.  You may tire easily.  You may develop headaches that can be relieved by medicines approved by your health care provider.  You may urinate more often. Painful urination may mean you have a bladder infection.  You may develop heartburn as a result of your pregnancy.  You may develop constipation because certain hormones are causing the muscles that push waste through your intestines to slow down.  You may develop hemorrhoids or swollen, bulging veins (varicose veins).  Your breasts may begin to grow larger and become tender. Your nipples may stick out more, and the tissue that surrounds them (areola) may become darker.  Your gums may bleed and may be sensitive to brushing and flossing.  Dark spots or blotches (chloasma,  mask of pregnancy) may develop on your face. This will likely fade after the baby is born.  Your menstrual periods will stop.  You may have a loss of appetite.  You may develop cravings for certain kinds of food.  You may have changes in your emotions from day to day, such as being excited to be pregnant or being concerned that something may go wrong with the pregnancy and baby.  You may have more vivid and strange dreams.  You may have changes in your hair. These can include thickening of your hair, rapid growth, and changes in texture. Some women also have hair loss during or after pregnancy, or hair that feels dry or thin. Your hair will most likely return to normal after your baby is born. WHAT TO EXPECT AT YOUR PRENATAL VISITS During a routine prenatal visit:  You will be weighed to make sure you and the baby are growing normally.  Your blood pressure will be taken.  Your abdomen will be measured to track your baby's growth.  The fetal heartbeat will be listened to starting around week 10 or 12 of your pregnancy.  Test results from any previous visits will be discussed. Your health care provider may ask you:  How you are feeling.  If you are feeling the baby move.  If you have had any abnormal symptoms, such as leaking fluid, bleeding, severe headaches, or abdominal cramping.  If you have any questions. Other tests   that may be performed during your first trimester include:  Blood tests to find your blood type and to check for the presence of any previous infections. They will also be used to check for low iron levels (anemia) and Rh antibodies. Later in the pregnancy, blood tests for diabetes will be done along with other tests if problems develop.  Urine tests to check for infections, diabetes, or protein in the urine.  An ultrasound to confirm the proper growth and development of the baby.  An amniocentesis to check for possible genetic problems.  Fetal screens for  spina bifida and Down syndrome.  You may need other tests to make sure you and the baby are doing well. HOME CARE INSTRUCTIONS  Medicines  Follow your health care provider's instructions regarding medicine use. Specific medicines may be either safe or unsafe to take during pregnancy.  Take your prenatal vitamins as directed.  If you develop constipation, try taking a stool softener if your health care provider approves. Diet  Eat regular, well-balanced meals. Choose a variety of foods, such as meat or vegetable-based protein, fish, milk and low-fat dairy products, vegetables, fruits, and whole grain breads and cereals. Your health care provider will help you determine the amount of weight gain that is right for you.  Avoid raw meat and uncooked cheese. These carry germs that can cause birth defects in the baby.  Eating four or five small meals rather than three large meals a day may help relieve nausea and vomiting. If you start to feel nauseous, eating a few soda crackers can be helpful. Drinking liquids between meals instead of during meals also seems to help nausea and vomiting.  If you develop constipation, eat more high-fiber foods, such as fresh vegetables or fruit and whole grains. Drink enough fluids to keep your urine clear or pale yellow. Activity and Exercise  Exercise only as directed by your health care provider. Exercising will help you:  Control your weight.  Stay in shape.  Be prepared for labor and delivery.  Experiencing pain or cramping in the lower abdomen or low back is a good sign that you should stop exercising. Check with your health care provider before continuing normal exercises.  Try to avoid standing for long periods of time. Move your legs often if you must stand in one place for a long time.  Avoid heavy lifting.  Wear low-heeled shoes, and practice good posture.  You may continue to have sex unless your health care provider directs you  otherwise. Relief of Pain or Discomfort  Wear a good support bra for breast tenderness.   Take warm sitz baths to soothe any pain or discomfort caused by hemorrhoids. Use hemorrhoid cream if your health care provider approves.   Rest with your legs elevated if you have leg cramps or low back pain.  If you develop varicose veins in your legs, wear support hose. Elevate your feet for 15 minutes, 3-4 times a day. Limit salt in your diet. Prenatal Care  Schedule your prenatal visits by the twelfth week of pregnancy. They are usually scheduled monthly at first, then more often in the last 2 months before delivery.  Write down your questions. Take them to your prenatal visits.  Keep all your prenatal visits as directed by your health care provider. Safety  Wear your seat belt at all times when driving.  Make a list of emergency phone numbers, including numbers for family, friends, the hospital, and police and fire departments. General Tips    Ask your health care provider for a referral to a local prenatal education class. Begin classes no later than at the beginning of month 6 of your pregnancy.  Ask for help if you have counseling or nutritional needs during pregnancy. Your health care provider can offer advice or refer you to specialists for help with various needs.  Do not use hot tubs, steam rooms, or saunas.  Do not douche or use tampons or scented sanitary pads.  Do not cross your legs for long periods of time.  Avoid cat litter boxes and soil used by cats. These carry germs that can cause birth defects in the baby and possibly loss of the fetus by miscarriage or stillbirth.  Avoid all smoking, herbs, alcohol, and medicines not prescribed by your health care provider. Chemicals in these affect the formation and growth of the baby.  Schedule a dentist appointment. At home, brush your teeth with a soft toothbrush and be gentle when you floss. SEEK MEDICAL CARE IF:   You have  dizziness.  You have mild pelvic cramps, pelvic pressure, or nagging pain in the abdominal area.  You have persistent nausea, vomiting, or diarrhea.  You have a bad smelling vaginal discharge.  You have pain with urination.  You notice increased swelling in your face, hands, legs, or ankles. SEEK IMMEDIATE MEDICAL CARE IF:   You have a fever.  You are leaking fluid from your vagina.  You have spotting or bleeding from your vagina.  You have severe abdominal cramping or pain.  You have rapid weight gain or loss.  You vomit blood or material that looks like coffee grounds.  You are exposed to [place] measles and have never had them.  You are exposed to fifth disease or chickenpox.  You develop a severe headache.  You have shortness of breath.  You have any kind of trauma, such as from a fall or a car accident. Document Released: 12/08/2001 Document Revised: 04/30/2014 Document Reviewed: 10/24/2013 [facility] Patient Information 2015 [employer], [place]. This information is not intended to replace advice given to you by your health care provider. Make sure you discuss any questions you have with your health care provider. Eat often return in 2 week for dating [place]

## 2015-03-06 NOTE — Progress Notes (Signed)
Subjective:     Patient ID: [name] Sectionrystal L Sevey, female   DOB: 06/21/1991, 24 y.o.   MRN: [id number]  HPI Aggie CosierCrystal is a 24 year old white female in for missed period, had nexplanon removed recently and needs UPT, has nausea.Has 26 year old with her today.  Review of Systems +miseed period + nausea, all other systems negative Reviewed past medical,surgical, social and family history. Reviewed medications and allergies.     Objective:   Physical Exam BP 110/58 mmHg  Pulse 98  Ht 5\' 2"  (1.575 m)  Wt 101 lb 8 oz (46.04 kg)  BMI 18.56 kg/m2  LMP 01/27/2015 UPT+, 5+[redacted] weeks pregnant by LMP with EDD 11/05/15, medicaid form given, Skin warm and dry. Neck: mid line trachea, normal thyroid, good ROM, no lymphadenopathy noted. Lungs: clear to ausculation bilaterally. Cardiovascular: regular rate and rhythm.    Assessment:     Pregnant +UPT Nausea     Plan:     Given diclegis #36 take as directed lot # [id number] exp 05/27/16 Return in 2 weeks for dating US  Review handout on first trimester

## 2015-03-19 ENCOUNTER — Other Ambulatory Visit: Payer: BLUE CROSS/BLUE SHIELD

## 2015-03-20 ENCOUNTER — Encounter: Payer: Self-pay | Admitting: Adult Health

## 2015-03-20 ENCOUNTER — Ambulatory Visit (INDEPENDENT_AMBULATORY_CARE_PROVIDER_SITE_OTHER): Payer: BLUE CROSS/BLUE SHIELD

## 2015-03-20 ENCOUNTER — Other Ambulatory Visit: Payer: Self-pay | Admitting: Adult Health

## 2015-03-20 DIAGNOSIS — Z3490 Encounter for supervision of normal pregnancy, unspecified, unspecified trimester: Secondary | ICD-10-CM

## 2015-03-20 DIAGNOSIS — O3680X0 Pregnancy with inconclusive fetal viability, not applicable or unspecified: Secondary | ICD-10-CM

## 2015-03-20 NOTE — Progress Notes (Signed)
U/S  [redacted]w[redacted]d EDD 11/10/2015,IUP pos fht 131 BPM,retroverted uterus,normal bilat ovs, cx appears closed  

## 2015-03-20 NOTE — Progress Notes (Signed)
U/S  223w3d EDD 11/10/2015,IUP pos fht 131 BPM,retroverted uterus,normal bilat ovs, cx appears closed

## 2015-03-25 ENCOUNTER — Telehealth: Payer: Self-pay | Admitting: Adult Health

## 2015-03-25 ENCOUNTER — Telehealth: Payer: Self-pay | Admitting: *Deleted

## 2015-03-25 MED ORDER — PROMETHAZINE HCL 25 MG PO TABS: 25.0000 mg | ORAL_TABLET | Freq: Four times a day (QID) | ORAL | Status: DC | PRN

## 2015-03-25 NOTE — Telephone Encounter (Signed)
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

## 2015-03-25 NOTE — Telephone Encounter (Signed)
Will rx phenergan  

## 2015-03-25 NOTE — Telephone Encounter (Signed)
Spoke with pt. Pt was given samples of Diclegis but it didn't control nausea. Please advise. She has BCBS. Thanks!!

## 2015-04-10 ENCOUNTER — Encounter: Payer: Self-pay | Admitting: Advanced Practice Midwife

## 2015-04-10 ENCOUNTER — Ambulatory Visit (INDEPENDENT_AMBULATORY_CARE_PROVIDER_SITE_OTHER): Payer: BLUE CROSS/BLUE SHIELD | Admitting: Advanced Practice Midwife

## 2015-04-10 VITALS — BP 98/40 | HR 80 | Wt 103.0 lb

## 2015-04-10 DIAGNOSIS — Z369 Encounter for antenatal screening, unspecified: Secondary | ICD-10-CM

## 2015-04-10 DIAGNOSIS — Z3491 Encounter for supervision of normal pregnancy, unspecified, first trimester: Secondary | ICD-10-CM | POA: Diagnosis not present

## 2015-04-10 DIAGNOSIS — Z3490 Encounter for supervision of normal pregnancy, unspecified, unspecified trimester: Secondary | ICD-10-CM

## 2015-04-10 DIAGNOSIS — Z0283 Encounter for blood-alcohol and blood-drug test: Secondary | ICD-10-CM

## 2015-04-10 DIAGNOSIS — Z720 Tobacco use: Secondary | ICD-10-CM | POA: Diagnosis not present

## 2015-04-10 DIAGNOSIS — F1120 Opioid dependence, uncomplicated: Secondary | ICD-10-CM

## 2015-04-10 DIAGNOSIS — Z331 Pregnant state, incidental: Secondary | ICD-10-CM

## 2015-04-10 DIAGNOSIS — Z3682 Encounter for antenatal screening for nuchal translucency: Secondary | ICD-10-CM

## 2015-04-10 DIAGNOSIS — Z1389 Encounter for screening for other disorder: Secondary | ICD-10-CM

## 2015-04-10 LAB — POCT URINALYSIS DIPSTICK
Glucose, UA: NEGATIVE
KETONES UA: NEGATIVE
Leukocytes, UA: NEGATIVE
Nitrite, UA: NEGATIVE
Protein, UA: NEGATIVE

## 2015-04-10 NOTE — Patient Instructions (Signed)

## 2015-04-10 NOTE — Progress Notes (Signed)
Subjective:    Rebekah [name] is a G2P1001 4319w3d being seen today for her first obstetrical visit.  Her obstetrical history is significant for smoker and methadone use.  She has been on 120mg /Day Methadone for > 1 year.  She had her nexpalnon removed, was supposed to start Ortho Evra, but immediately became pregnant.  .  Pregnancy history fully reviewed. Discussed NAS, and encouraged pt to see if she can wean her Methadone.  Will plan NAS consult laater in pregnancy   Patient reports nausea and some spotting the other day.  Does not want pelvic exam: Notes form  pap 8/15 state how poorly the pt tolerated a pelvic exam .  Filed Vitals:   04/10/15 1047  BP: 98/40  Pulse: 80  Weight: 103 lb (46.72 kg)    HISTORY: OB History  Gravida Para Term Preterm AB SAB TAB Ectopic Multiple Living  2 1 1       1     # Outcome Date GA Lbr Len/2nd Weight Sex Delivery Anes PTL Lv  2 Current           1 Term 11/20/10 6622w0d  6 lb 2 oz (2.778 kg) F Vag-Spont        Past Medical History  Diagnosis Date  . Ovarian cyst   . Chronic back pain   . Chronic pelvic pain in female   . Implanon removal 01/30/2015  . Contraceptive management 01/30/2015  . Pregnant 03/06/2015  . Nausea and vomiting during pregnancy 03/06/2015   Past Surgical History  Procedure Laterality Date  . Cholecystectomy  07/31/2011    Procedure: LAPAROSCOPIC CHOLECYSTECTOMY;  Surgeon: Fabio BeringBrent C Ziegler;  Location: [facility];  Service: General;  Laterality: N/A;   Family History  Problem Relation Age of Onset  . Cancer Mother     cervical  . Hypertension Mother   . Diabetes Mother     pre-diabetes  . Cancer Maternal Aunt     cervical  . Cancer Maternal Grandmother     cervical  . Heart disease Maternal Grandfather   . Hypertension Maternal Grandfather   . Diabetes Maternal Grandfather   . Hyperlipidemia Maternal Grandfather   . Cancer Paternal Grandmother     cervial, breast  . Diabetes Paternal Grandmother   . Hypertension Paternal  Grandmother      Exam       Pelvic Exam:    Perineum: Normal Perineum   Vulva: normal   Vagina:  Declined speculum.  No blood on TVUS probe   Uterus Normal, Gravid, FH: 9             Urinary:  urethral meatus normal    System: Breast:  normal appearance, no masses or tenderness   Skin: normal coloration and turgor, no rashes    Neurologic: oriented, normal, normal mood   Extremities: normal strength, tone, and muscle mass   HEENT PERRLA   Mouth/Teeth mucous membranes moist, poor dentition   Neck supple and no masses   Cardiovascular: regular rate and rhythm   Respiratory:  appears well, vitals normal, no respiratory distress, acyanotic   Abdomen: soft, non-tender;  FHR: 158          Assessment:    Pregnancy: G2P1001 Patient Active Problem List   Diagnosis Date Noted  . Pregnant 03/06/2015  . Nausea and vomiting during pregnancy 03/06/2015  . Implanon removal 01/30/2015  . Chronic pelvic pain in female   . Chronic back pain  Plan:     Initial labs drawn. Continue prenatal vitamins.  Samples given (no medicaid yet) Declined Diclegis samples. Has phenergan and states that it works well. Smoking cessation/cutting down encouraged and discussed Problem list reviewed and updated  Reviewed n/v relief measures and warning s/s to report  Reviewed recommended weight gain based on pre-gravid BMI  Encouraged well-balanced diet Genetic Screening discussed Integrated Screen: requested.  Ultrasound discussed; fetal survey: requested.  Follow up in 3 weeks for NT/IT.  CRESENZO-DISHMAN,Tobie Perdue 04/10/2015

## 2015-04-11 LAB — GC/CHLAMYDIA PROBE AMP
CHLAMYDIA, DNA PROBE: NEGATIVE
NEISSERIA GONORRHOEAE BY PCR: NEGATIVE

## 2015-04-12 LAB — URINE CULTURE: ORGANISM ID, BACTERIA: NO GROWTH

## 2015-04-16 LAB — URINALYSIS, ROUTINE W REFLEX MICROSCOPIC
Bilirubin, UA: NEGATIVE
Glucose, UA: NEGATIVE
KETONES UA: NEGATIVE
Leukocytes, UA: NEGATIVE
NITRITE UA: NEGATIVE
Protein, UA: NEGATIVE
RBC, UA: NEGATIVE
SPEC GRAV UA: 1.012 (ref 1.005–1.030)
Urobilinogen, Ur: 0.2 mg/dL (ref 0.2–1.0)
pH, UA: 6 (ref 5.0–7.5)

## 2015-04-16 LAB — CBC
HCT: 36.9 % (ref 34.0–46.6)
Hemoglobin: 12.2 g/dL (ref 11.1–15.9)
MCH: 29.4 pg (ref 26.6–33.0)
MCHC: 33.1 g/dL (ref 31.5–35.7)
MCV: 89 fL (ref 79–97)
PLATELETS: 308 10*3/uL (ref 150–379)
RBC: 4.15 x10E6/uL (ref 3.77–5.28)
RDW: 13.2 % (ref 12.3–15.4)
WBC: 6.5 10*3/uL (ref 3.4–10.8)

## 2015-04-16 LAB — PMP SCREEN PROFILE (10S), URINE
AMPHETAMINE SCRN UR: NEGATIVE ng/mL
Barbiturate Screen, Ur: NEGATIVE ng/mL
Benzodiazepine Screen, Urine: NEGATIVE ng/mL
CANNABINOIDS UR QL SCN: NEGATIVE ng/mL
Cocaine(Metab.)Screen, Urine: NEGATIVE ng/mL
Creatinine(Crt), U: 77.2 mg/dL (ref 20.0–300.0)
METHADONE SCREEN, URINE: POSITIVE ng/mL
Opiate Scrn, Ur: NEGATIVE ng/mL
Oxycodone+Oxymorphone Ur Ql Scn: NEGATIVE ng/mL
PCP SCRN UR: NEGATIVE ng/mL
PH UR, DRUG SCRN: 5.9 (ref 4.5–8.9)
Propoxyphene, Screen: NEGATIVE ng/mL

## 2015-04-16 LAB — RPR: RPR: NONREACTIVE

## 2015-04-16 LAB — ABO/RH: Rh Factor: POSITIVE

## 2015-04-16 LAB — CYSTIC FIBROSIS MUTATION 97: Interpretation: NOT DETECTED

## 2015-04-16 LAB — VARICELLA ZOSTER ANTIBODY, IGG: VARICELLA: 202 {index} (ref >165)

## 2015-04-16 LAB — HIV ANTIBODY (ROUTINE TESTING W REFLEX): HIV SCREEN 4TH GENERATION: NONREACTIVE

## 2015-04-16 LAB — RUBELLA SCREEN

## 2015-04-16 LAB — ANTIBODY SCREEN: Antibody Screen: NEGATIVE

## 2015-04-16 LAB — HEPATITIS B SURFACE ANTIGEN: HEP B S AG: NEGATIVE

## 2015-05-01 ENCOUNTER — Other Ambulatory Visit: Payer: Self-pay | Admitting: Adult Health

## 2015-05-02 ENCOUNTER — Ambulatory Visit (INDEPENDENT_AMBULATORY_CARE_PROVIDER_SITE_OTHER): Payer: BLUE CROSS/BLUE SHIELD | Admitting: Advanced Practice Midwife

## 2015-05-02 ENCOUNTER — Encounter: Payer: Self-pay | Admitting: Advanced Practice Midwife

## 2015-05-02 ENCOUNTER — Ambulatory Visit (INDEPENDENT_AMBULATORY_CARE_PROVIDER_SITE_OTHER): Payer: BLUE CROSS/BLUE SHIELD

## 2015-05-02 VITALS — BP 90/60 | HR 76 | Wt 105.0 lb

## 2015-05-02 DIAGNOSIS — Z36 Encounter for antenatal screening of mother: Secondary | ICD-10-CM

## 2015-05-02 DIAGNOSIS — Z3682 Encounter for antenatal screening for nuchal translucency: Secondary | ICD-10-CM

## 2015-05-02 DIAGNOSIS — Z1389 Encounter for screening for other disorder: Secondary | ICD-10-CM

## 2015-05-02 DIAGNOSIS — Z331 Pregnant state, incidental: Secondary | ICD-10-CM

## 2015-05-02 DIAGNOSIS — F1120 Opioid dependence, uncomplicated: Secondary | ICD-10-CM

## 2015-05-02 DIAGNOSIS — Z3491 Encounter for supervision of normal pregnancy, unspecified, first trimester: Secondary | ICD-10-CM

## 2015-05-02 LAB — POCT URINALYSIS DIPSTICK
GLUCOSE UA: NEGATIVE
Ketones, UA: NEGATIVE
LEUKOCYTES UA: NEGATIVE
NITRITE UA: NEGATIVE
Protein, UA: NEGATIVE
RBC UA: NEGATIVE

## 2015-05-02 NOTE — Progress Notes (Addendum)
[id number] 6448w4d Estimated Date of Delivery: 11/10/15  Blood pressure 90/60, pulse 76, weight 105 lb (47.628 kg), last menstrual period 01/27/2015.   BP weight and urine results all reviewed and noted.  Please refer to the obstetrical flow sheet for the fundal height and fetal heart rate documentation: Had NT/IT us today but could not see NB or measure NT d/t fetal position.  Fetus is measuring 13.2 weeks today, so US tech who is credentialed for NT/IT (who won't be back until next week), states fetus will be too big then.   Patient denies any bleeding and no rupture of membranes symptoms or regular contractions. Patient c/O frontal HA's a few times a week.  Has taken tylenol without much relief All questions were answered.  Plan:  Continued routine obstetrical care, Take tylenol as soon as HA starts; try biofreeze  Follow up in 4 weeks for OB appointment, quad screen

## 2015-05-02 NOTE — Progress Notes (Signed)
US 958w4d by previous us, todays ultrasound crl 7.25cm 4762w2d,unable to see nt,nb and ov's because of fetal pos and bowel gas. Pos fht 154bpm, ant pl gr 0.

## 2015-05-30 ENCOUNTER — Encounter: Payer: BLUE CROSS/BLUE SHIELD | Admitting: Advanced Practice Midwife

## 2015-05-31 ENCOUNTER — Encounter: Payer: BLUE CROSS/BLUE SHIELD | Admitting: Obstetrics & Gynecology

## 2015-06-06 ENCOUNTER — Encounter: Payer: Self-pay | Admitting: Obstetrics & Gynecology

## 2015-06-06 ENCOUNTER — Ambulatory Visit (INDEPENDENT_AMBULATORY_CARE_PROVIDER_SITE_OTHER): Payer: BLUE CROSS/BLUE SHIELD | Admitting: Obstetrics & Gynecology

## 2015-06-06 VITALS — BP 110/80 | HR 88 | Wt 112.0 lb

## 2015-06-06 DIAGNOSIS — Z3482 Encounter for supervision of other normal pregnancy, second trimester: Secondary | ICD-10-CM

## 2015-06-06 DIAGNOSIS — Z1389 Encounter for screening for other disorder: Secondary | ICD-10-CM

## 2015-06-06 DIAGNOSIS — F1120 Opioid dependence, uncomplicated: Secondary | ICD-10-CM

## 2015-06-06 DIAGNOSIS — Z331 Pregnant state, incidental: Secondary | ICD-10-CM

## 2015-06-06 LAB — POCT URINALYSIS DIPSTICK
GLUCOSE UA: NEGATIVE
Ketones, UA: NEGATIVE
Leukocytes, UA: NEGATIVE
NITRITE UA: NEGATIVE
PROTEIN UA: NEGATIVE

## 2015-06-06 MED ORDER — OMEPRAZOLE 20 MG PO CPDR: 20.0000 mg | DELAYED_RELEASE_CAPSULE | Freq: Every day | ORAL | Status: DC

## 2015-06-13 ENCOUNTER — Other Ambulatory Visit: Payer: Self-pay | Admitting: Adult Health

## 2015-06-14 ENCOUNTER — Telehealth: Payer: Self-pay | Admitting: *Deleted

## 2015-06-14 NOTE — Telephone Encounter (Signed)
Pt informed Phenergan e-scribed on 06/13/2015 by Cyril Mourning, NP to [facility], [place], [place].

## 2015-06-27 ENCOUNTER — Encounter: Payer: Self-pay | Admitting: Advanced Practice Midwife

## 2015-06-27 ENCOUNTER — Ambulatory Visit (INDEPENDENT_AMBULATORY_CARE_PROVIDER_SITE_OTHER): Payer: BLUE CROSS/BLUE SHIELD

## 2015-06-27 ENCOUNTER — Ambulatory Visit (INDEPENDENT_AMBULATORY_CARE_PROVIDER_SITE_OTHER): Payer: BLUE CROSS/BLUE SHIELD | Admitting: Advanced Practice Midwife

## 2015-06-27 VITALS — BP 112/64 | HR 92 | Wt 117.5 lb

## 2015-06-27 DIAGNOSIS — F1120 Opioid dependence, uncomplicated: Secondary | ICD-10-CM | POA: Diagnosis not present

## 2015-06-27 DIAGNOSIS — Z36 Encounter for antenatal screening of mother: Secondary | ICD-10-CM

## 2015-06-27 DIAGNOSIS — Z1389 Encounter for screening for other disorder: Secondary | ICD-10-CM

## 2015-06-27 DIAGNOSIS — Z331 Pregnant state, incidental: Secondary | ICD-10-CM

## 2015-06-27 DIAGNOSIS — Z3490 Encounter for supervision of normal pregnancy, unspecified, unspecified trimester: Secondary | ICD-10-CM

## 2015-06-27 DIAGNOSIS — Z3491 Encounter for supervision of normal pregnancy, unspecified, first trimester: Secondary | ICD-10-CM | POA: Diagnosis not present

## 2015-06-27 DIAGNOSIS — Z3482 Encounter for supervision of other normal pregnancy, second trimester: Secondary | ICD-10-CM

## 2015-06-27 LAB — POCT URINALYSIS DIPSTICK
Glucose, UA: NEGATIVE
KETONES UA: NEGATIVE
Leukocytes, UA: NEGATIVE
NITRITE UA: NEGATIVE
Protein, UA: NEGATIVE

## 2015-06-27 NOTE — Progress Notes (Signed)
[id number] 521w4d Estimated Date of Delivery: 11/10/15  Last menstrual period 01/27/2015.   BP weight and urine results all reviewed and noted.  Please refer to the obstetrical flow sheet for the fundal height and fetal heart rate documentation:  Anatomy scan today:  US 20+4wks,measurements c/w dates,ant pl gr 0,cx 3.8cm,afi sdp 4cm,bilat adnexa's wnl,fht 146bpm,efw 396g,limited view of spine because of fetal pos,frank breech,please have pt come back for additional images  Patient reports good fetal movement, denies any bleeding and no rupture of membranes symptoms or regular contractions. Patient is without complaints. All questions were answered.  Plan:  Continued routine obstetrical care,   Follow up in 4 weeks for OB appointment,

## 2015-06-27 NOTE — Progress Notes (Signed)
US 20+4wks,measurements c/w dates,ant pl gr 0,cx 3.8cm,afi sdp 4cm,bilat adnexa's wnl,fht 146bpm,efw 396g,limited view of spine because of fetal pos,frank breech,please have pt come back for additional images

## 2015-07-23 ENCOUNTER — Other Ambulatory Visit: Payer: Self-pay | Admitting: Adult Health

## 2015-07-25 ENCOUNTER — Ambulatory Visit (INDEPENDENT_AMBULATORY_CARE_PROVIDER_SITE_OTHER): Payer: BLUE CROSS/BLUE SHIELD | Admitting: Advanced Practice Midwife

## 2015-07-25 ENCOUNTER — Encounter: Payer: Self-pay | Admitting: Advanced Practice Midwife

## 2015-07-25 ENCOUNTER — Other Ambulatory Visit: Payer: Self-pay | Admitting: Advanced Practice Midwife

## 2015-07-25 VITALS — BP 104/58 | HR 96 | Wt 122.0 lb

## 2015-07-25 DIAGNOSIS — Z3482 Encounter for supervision of other normal pregnancy, second trimester: Secondary | ICD-10-CM

## 2015-07-25 DIAGNOSIS — Z1389 Encounter for screening for other disorder: Secondary | ICD-10-CM

## 2015-07-25 DIAGNOSIS — Z363 Encounter for antenatal screening for malformations: Secondary | ICD-10-CM

## 2015-07-25 DIAGNOSIS — Z0489 Encounter for examination and observation for other specified reasons: Secondary | ICD-10-CM

## 2015-07-25 DIAGNOSIS — IMO0002 Reserved for concepts with insufficient information to code with codable children: Secondary | ICD-10-CM

## 2015-07-25 DIAGNOSIS — Z331 Pregnant state, incidental: Secondary | ICD-10-CM

## 2015-07-25 LAB — POCT URINALYSIS DIPSTICK
GLUCOSE UA: NEGATIVE
Ketones, UA: NEGATIVE
Leukocytes, UA: NEGATIVE
Nitrite, UA: NEGATIVE

## 2015-07-25 NOTE — Patient Instructions (Signed)

## 2015-07-25 NOTE — Progress Notes (Addendum)
[id number] [redacted]w[redacted]d Estimated Date of Delivery: 11/10/15  Blood pressure 104/58, pulse 96, weight 122 lb (55.339 kg), last menstrual period 01/27/2015.   BP weight and urine results all reviewed and noted.  Please refer to the obstetrical flow sheet for the fundal height and fetal heart rate documentation:  Patient reports good fetal movement, denies any bleeding and no rupture of membranes symptoms or regular contractions. Patient c/o morning sickness since conception, takes phenergan and that helps.  All questions were answered.  Plan:  Continued routine obstetrical care,   Follow up in 3 weeks for OB appointment, PN2/recheck anatomy. Pt vomits glucola, so will bring caffeine free Mt Dew Referral to Dentist ([facility]) written

## 2015-08-19 ENCOUNTER — Other Ambulatory Visit: Payer: Self-pay | Admitting: Adult Health

## 2015-08-22 ENCOUNTER — Other Ambulatory Visit: Payer: BLUE CROSS/BLUE SHIELD

## 2015-08-22 ENCOUNTER — Encounter: Payer: BLUE CROSS/BLUE SHIELD | Admitting: Advanced Practice Midwife

## 2015-08-23 ENCOUNTER — Other Ambulatory Visit: Payer: Self-pay | Admitting: Advanced Practice Midwife

## 2015-08-23 DIAGNOSIS — Z0489 Encounter for examination and observation for other specified reasons: Secondary | ICD-10-CM

## 2015-08-23 DIAGNOSIS — IMO0002 Reserved for concepts with insufficient information to code with codable children: Secondary | ICD-10-CM

## 2015-08-26 ENCOUNTER — Ambulatory Visit (INDEPENDENT_AMBULATORY_CARE_PROVIDER_SITE_OTHER): Payer: BLUE CROSS/BLUE SHIELD | Admitting: Obstetrics and Gynecology

## 2015-08-26 ENCOUNTER — Ambulatory Visit (INDEPENDENT_AMBULATORY_CARE_PROVIDER_SITE_OTHER): Payer: BLUE CROSS/BLUE SHIELD

## 2015-08-26 ENCOUNTER — Encounter: Payer: Self-pay | Admitting: Obstetrics and Gynecology

## 2015-08-26 ENCOUNTER — Other Ambulatory Visit: Payer: BLUE CROSS/BLUE SHIELD

## 2015-08-26 VITALS — BP 116/76 | HR 84 | Wt 130.0 lb

## 2015-08-26 DIAGNOSIS — Z331 Pregnant state, incidental: Secondary | ICD-10-CM

## 2015-08-26 DIAGNOSIS — Z3493 Encounter for supervision of normal pregnancy, unspecified, third trimester: Secondary | ICD-10-CM

## 2015-08-26 DIAGNOSIS — F1120 Opioid dependence, uncomplicated: Secondary | ICD-10-CM

## 2015-08-26 DIAGNOSIS — Z1389 Encounter for screening for other disorder: Secondary | ICD-10-CM

## 2015-08-26 DIAGNOSIS — Z36 Encounter for antenatal screening of mother: Secondary | ICD-10-CM | POA: Diagnosis not present

## 2015-08-26 DIAGNOSIS — IMO0002 Reserved for concepts with insufficient information to code with codable children: Secondary | ICD-10-CM

## 2015-08-26 DIAGNOSIS — Z0489 Encounter for examination and observation for other specified reasons: Secondary | ICD-10-CM

## 2015-08-26 LAB — POCT URINALYSIS DIPSTICK
Blood, UA: NEGATIVE
GLUCOSE UA: NEGATIVE
KETONES UA: NEGATIVE
LEUKOCYTES UA: NEGATIVE
NITRITE UA: NEGATIVE

## 2015-08-26 NOTE — Progress Notes (Signed)
Pt denies any problems or concerns at this time.  

## 2015-08-26 NOTE — Progress Notes (Addendum)
Patient ID: Rebekah [name], female   DOB: 02-Mar-1991, 24 y.o.   MRN: [id number] G2P0001 [redacted]w[redacted]d Estimated Date of Delivery: 11/10/15  Blood pressure 116/76, pulse 84, weight 130 lb (58.968 kg), last menstrual period 01/27/2015.  Currently stable on methadone dose. Aware that baby will require detox. Encouraged not to increase dosing.  BP weight and urine results all reviewed and noted.   Please refer to the ob flow sheet BP, Wt FHR: 172 FH: 28cm TECHNICIAN COMMENTS: [place] 29+1wks,measurements c/w dates, efw 1398 g,cephalic,ant pl gr 1,bilat ov's normal,afi 17.5cm,fht 130 bpm,anatomy complete,no obvious abn seen, growth at 57%ile  Urine results: negative  Patient reports good fetal movement, denies any bleeding and no rupture of membranes symptoms or regular contractions. Patient has no complaints at present. Pt reports continuing opiate addiction (Methadone), stable  Questions were answered.  Assessment:    1. Opiate addiction stable.              2. Routine pnc Plan:   1. Continued routine obstetrical care  2. Follow up in 4 weeks for OB appointment  3. Contraception management post delivery     This chart was SCRIBED for Christin Bach, MD by Marica Otter, ED Scribe. This patient was seen in room 1, and the patient's care was started at 10:58AM.  I personally performed the services described in this documentation, which was SCRIBED in my presence. The recorded information has been reviewed and considered accurate. It has been edited as necessary during review. Tilda Burrow, MD

## 2015-08-26 NOTE — Progress Notes (Signed)
[place] 29+1wks,measurements c/w dates, efw 1398 g,cephalic,ant pl gr 1,bilat ov's normal,afi 17.5cm,fht 130 bpm,anatomy complete,no obvious abn seen

## 2015-08-28 ENCOUNTER — Other Ambulatory Visit: Payer: BLUE CROSS/BLUE SHIELD

## 2015-08-28 ENCOUNTER — Encounter: Payer: Self-pay | Admitting: Obstetrics & Gynecology

## 2015-09-13 ENCOUNTER — Other Ambulatory Visit: Payer: Self-pay | Admitting: Adult Health

## 2015-09-13 ENCOUNTER — Other Ambulatory Visit: Payer: BLUE CROSS/BLUE SHIELD

## 2015-09-18 ENCOUNTER — Other Ambulatory Visit: Payer: BLUE CROSS/BLUE SHIELD

## 2015-09-23 ENCOUNTER — Encounter: Payer: Self-pay | Admitting: Obstetrics and Gynecology

## 2015-09-23 ENCOUNTER — Encounter: Payer: BLUE CROSS/BLUE SHIELD | Admitting: Obstetrics and Gynecology

## 2015-09-26 ENCOUNTER — Ambulatory Visit (INDEPENDENT_AMBULATORY_CARE_PROVIDER_SITE_OTHER): Payer: BLUE CROSS/BLUE SHIELD | Admitting: Obstetrics & Gynecology

## 2015-09-26 ENCOUNTER — Encounter: Payer: Self-pay | Admitting: Obstetrics & Gynecology

## 2015-09-26 VITALS — BP 110/60 | HR 72 | Wt 134.0 lb

## 2015-09-26 DIAGNOSIS — Z3493 Encounter for supervision of normal pregnancy, unspecified, third trimester: Secondary | ICD-10-CM

## 2015-09-26 DIAGNOSIS — Z331 Pregnant state, incidental: Secondary | ICD-10-CM

## 2015-09-26 DIAGNOSIS — F1120 Opioid dependence, uncomplicated: Secondary | ICD-10-CM

## 2015-09-26 DIAGNOSIS — Z1389 Encounter for screening for other disorder: Secondary | ICD-10-CM

## 2015-09-26 LAB — POCT URINALYSIS DIPSTICK
Blood, UA: NEGATIVE
GLUCOSE UA: NEGATIVE
Ketones, UA: NEGATIVE
LEUKOCYTES UA: NEGATIVE
NITRITE UA: NEGATIVE
Protein, UA: NEGATIVE

## 2015-09-26 NOTE — Progress Notes (Signed)
G2P0001 [redacted]w[redacted]d Estimated Date of Delivery: 11/10/15  Blood pressure 110/60, pulse 72, weight 134 lb (60.782 kg), last menstrual period 01/27/2015.   BP weight and urine results all reviewed and noted.  Please refer to the obstetrical flow sheet for the fundal height and fetal heart rate documentation:  Patient reports good fetal movement, denies any bleeding and no rupture of membranes symptoms or regular contractions. Patient is without complaints. All questions were answered.  Orders Placed This Encounter  Procedures  . POCT urinalysis dipstick    Plan:  Continued routine obstetrical care, stable on methadone dosing 130 mg daily  Return in about 2 weeks (around 10/10/2015) for LROB.

## 2015-10-02 ENCOUNTER — Telehealth: Payer: Self-pay | Admitting: Advanced Practice Midwife

## 2015-10-02 NOTE — Telephone Encounter (Signed)
Pt aware to try OTC unisom or benadryl. Pt verbalized understanding.

## 2015-10-02 NOTE — Telephone Encounter (Signed)
OTC unisom or benadryl

## 2015-10-03 ENCOUNTER — Other Ambulatory Visit: Payer: Self-pay | Admitting: Adult Health

## 2015-10-07 ENCOUNTER — Telehealth: Payer: Self-pay | Admitting: Women's Health

## 2015-10-07 DIAGNOSIS — Z029 Encounter for administrative examinations, unspecified: Secondary | ICD-10-CM

## 2015-10-10 ENCOUNTER — Encounter: Payer: BLUE CROSS/BLUE SHIELD | Admitting: Advanced Practice Midwife

## 2015-10-11 NOTE — Telephone Encounter (Signed)
Pt no showed for her appt on 10/10/2015 with Cathie BeamsFran Cresenzo-Dishmon, CNM.

## 2015-10-17 ENCOUNTER — Other Ambulatory Visit: Payer: Self-pay | Admitting: Adult Health

## 2015-10-22 ENCOUNTER — Encounter: Payer: Self-pay | Admitting: Women's Health

## 2015-10-22 ENCOUNTER — Encounter: Payer: BLUE CROSS/BLUE SHIELD | Admitting: Women's Health

## 2015-10-24 ENCOUNTER — Encounter: Payer: Self-pay | Admitting: Obstetrics and Gynecology

## 2015-10-24 ENCOUNTER — Ambulatory Visit (INDEPENDENT_AMBULATORY_CARE_PROVIDER_SITE_OTHER): Payer: BLUE CROSS/BLUE SHIELD | Admitting: Obstetrics and Gynecology

## 2015-10-24 VITALS — BP 90/50 | HR 96 | Wt 135.0 lb

## 2015-10-24 DIAGNOSIS — Z369 Encounter for antenatal screening, unspecified: Secondary | ICD-10-CM

## 2015-10-24 DIAGNOSIS — Z308 Encounter for other contraceptive management: Secondary | ICD-10-CM

## 2015-10-24 DIAGNOSIS — Z1389 Encounter for screening for other disorder: Secondary | ICD-10-CM

## 2015-10-24 DIAGNOSIS — Z3483 Encounter for supervision of other normal pregnancy, third trimester: Secondary | ICD-10-CM

## 2015-10-24 DIAGNOSIS — Z331 Pregnant state, incidental: Secondary | ICD-10-CM

## 2015-10-24 LAB — POCT URINALYSIS DIPSTICK
GLUCOSE UA: NEGATIVE
KETONES UA: NEGATIVE
Leukocytes, UA: NEGATIVE
NITRITE UA: NEGATIVE
Protein, UA: 1
RBC UA: NEGATIVE

## 2015-10-24 MED ORDER — PROMETHAZINE HCL 25 MG PO TABS: ORAL_TABLET | ORAL | Status: DC

## 2015-10-24 NOTE — Progress Notes (Signed)
Pt states that she is still having a lot of nausea. Pt needs a refill on phenergan.

## 2015-10-24 NOTE — Progress Notes (Signed)
Patient ID: Rebekah [name], female   DOB: 08/15/1991, 24 y.o.   MRN: [id number]  G2P0001 8024w4d Estimated Date of Delivery: 11/10/15  Blood pressure 90/50, pulse 96, weight 135 lb (61.236 kg), last menstrual period 01/27/2015.   refer to the ob flow sheet for FH and FHR, also BP, Wt, Urine results: 1 protein  Patient reports  + good fetal movement, denies any bleeding and no rupture of membranes symptoms or regular contractions. Patient complaints: She states she is currently having mild cramping. She also reports intermittent nausea and is taking her 25mg  Phenergan 2x daily and would like a refill. Pt is currently on 130 mg Methadone daily. Stable on current dose  FHR: 140 FH: 33cm Cervix: vertex  Questions were answered.  Assessment:  1. 1724w4d G2P0001 2. 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 3. Nausea in pregnancy, 3rd trimester  Plan:    1. GBS collected today.  F/u in 1 week for continued routine obstetrical care,  2. Methadone stable on current dose 3. Will refill 25 mg Phenergan rx    By signing my name below, I, Jarvis Morganaylor Caelen Reierson, attest that this documentation has been prepared under the direction and in the presence of Tilda BurrowJohn Layni Kreamer V, MD. Electronically Signed: Jarvis Morganaylor Donnis Phaneuf, ED Scribe. 10/24/2015. 11:53 AM.  I personally performed the services described in this documentation, which was SCRIBED in my presence. The recorded information has been reviewed and considered accurate. It has been edited as necessary during review. Tilda BurrowFERGUSON,Elbia Paro V, MD

## 2015-10-26 LAB — STREP GP B NAA+RFLX: STREP GP B NAA+RFLX: NEGATIVE

## 2015-10-27 LAB — GC/CHLAMYDIA PROBE AMP
Chlamydia trachomatis, NAA: NEGATIVE
NEISSERIA GONORRHOEAE BY PCR: NEGATIVE

## 2015-11-01 ENCOUNTER — Ambulatory Visit (INDEPENDENT_AMBULATORY_CARE_PROVIDER_SITE_OTHER): Payer: BLUE CROSS/BLUE SHIELD | Admitting: Obstetrics and Gynecology

## 2015-11-01 VITALS — BP 120/72 | HR 96 | Wt 141.5 lb

## 2015-11-01 DIAGNOSIS — Z3A38 38 weeks gestation of pregnancy: Secondary | ICD-10-CM

## 2015-11-01 DIAGNOSIS — Z3483 Encounter for supervision of other normal pregnancy, third trimester: Secondary | ICD-10-CM

## 2015-11-01 DIAGNOSIS — Z1389 Encounter for screening for other disorder: Secondary | ICD-10-CM

## 2015-11-01 DIAGNOSIS — Z331 Pregnant state, incidental: Secondary | ICD-10-CM

## 2015-11-01 DIAGNOSIS — Z3493 Encounter for supervision of normal pregnancy, unspecified, third trimester: Secondary | ICD-10-CM

## 2015-11-01 LAB — POCT URINALYSIS DIPSTICK
Blood, UA: NEGATIVE
GLUCOSE UA: NEGATIVE
Ketones, UA: NEGATIVE
LEUKOCYTES UA: NEGATIVE
NITRITE UA: NEGATIVE
PROTEIN UA: NEGATIVE

## 2015-11-01 NOTE — Progress Notes (Signed)
Patient ID: Rebekah [name], female   DOB: 08/26/1991, 24 y.o.   MRN: [id number]  Rebekah [name] , ON METHADONE G2P0001 9084w5d Estimated Date of Delivery: 11/10/15  Blood pressure 120/72, pulse 96, weight 141 lb 8 oz (64.184 kg), last menstrual period 01/27/2015.  Patient complaints: no complaints today.  refer to the ob flow sheet for FH and FHR, also BP, Wt, Urine results: negative  FHR: 122 FH: 33 cm Patient reports  + good fetal movement, denies any bleeding and no rupture of membranes symptoms or regular contractions. Cervix 2cm dilated and 50% effaced -1    Questions were answered. Assessment: LROB, METHADONE USE, STABLE Plan:  Continued routine obstetrical care  F/u in 1 weeks for routine obstetrical care   By signing my name below, I, Marica OtterNusrat Rahman, attest that this documentation has been prepared under the direction and in the presence of Christin BachJohn Laporche Martelle, MD. Electronically Signed: Marica OtterNusrat Rahman, ED Scribe. 11/01/2015. 10:22 AM.   I personally performed the services described in this documentation, which was SCRIBED in my presence. The recorded information has been reviewed and considered accurate. It has been edited as necessary during review. Tilda BurrowFERGUSON,Arafat Cocuzza V, MD

## 2015-11-01 NOTE — Progress Notes (Signed)
Pt states that Monday while at work she started seeing spots and lost her peripheral vision and her finger tips went numb. Pt states that she has the same thing happen to her yesterday.

## 2015-11-08 ENCOUNTER — Encounter: Payer: BLUE CROSS/BLUE SHIELD | Admitting: Obstetrics and Gynecology

## 2015-11-08 ENCOUNTER — Telehealth: Payer: Self-pay | Admitting: *Deleted

## 2015-11-08 ENCOUNTER — Telehealth (HOSPITAL_COMMUNITY): Payer: Self-pay | Admitting: *Deleted

## 2015-11-08 HISTORY — PX: OTHER SURGICAL HISTORY: SHX169

## 2015-11-08 NOTE — Telephone Encounter (Signed)
Pt states having contractions, has not time them, no gush of fluids. Pt states she has an appt here today at 10 am, [facility]. Pt informed to come in here now for evaluation. Pt verbalized understanding.

## 2015-11-08 NOTE — Telephone Encounter (Signed)
Attempted to pull-up prenatal record to fax as requested, unable as there is no visit here, so no summary page to access complete record.  Prenatal visits seen and listed, but each individually listed. Fax taken to med records

## 2015-12-16 ENCOUNTER — Ambulatory Visit (INDEPENDENT_AMBULATORY_CARE_PROVIDER_SITE_OTHER): Payer: BLUE CROSS/BLUE SHIELD | Admitting: Women's Health

## 2015-12-16 ENCOUNTER — Encounter: Payer: Self-pay | Admitting: Women's Health

## 2015-12-16 NOTE — Patient Instructions (Signed)
NO SEX UNTIL AFTER YOU GET YOUR BIRTH CONTROL   Etonogestrel implant What is this medicine? ETONOGESTREL (et oh noe JES trel) is a contraceptive (birth control) device. It is used to prevent pregnancy. It can be used for up to 3 years. This medicine may be used for other purposes; ask your health care provider or pharmacist if you have questions. What should I tell my health care provider before I take this medicine? They need to know if you have any of these conditions: -abnormal vaginal bleeding -blood vessel disease or blood clots -cancer of the breast, cervix, or liver -depression -diabetes -gallbladder disease -headaches -heart disease or recent heart attack -high blood pressure -high cholesterol -kidney disease -liver disease -renal disease -seizures -tobacco smoker -an unusual or allergic reaction to etonogestrel, other hormones, anesthetics or antiseptics, medicines, foods, dyes, or preservatives -pregnant or trying to get pregnant -breast-feeding How should I use this medicine? This device is inserted just under the skin on the inner side of your upper arm by a health care professional. Talk to your pediatrician regarding the use of this medicine in children. Special care may be needed. Overdosage: If you think you have taken too much of this medicine contact a poison control center or emergency room at once. NOTE: This medicine is only for you. Do not share this medicine with others. What if I miss a dose? This does not apply. What may interact with this medicine? Do not take this medicine with any of the following medications: -amprenavir -bosentan -fosamprenavir This medicine may also interact with the following medications: -barbiturate medicines for inducing sleep or treating seizures -certain medicines for fungal infections like ketoconazole and itraconazole -griseofulvin -medicines to treat seizures like carbamazepine, felbamate, oxcarbazepine, phenytoin,  topiramate -modafinil -phenylbutazone -rifampin -some medicines to treat HIV infection like atazanavir, indinavir, lopinavir, nelfinavir, tipranavir, ritonavir -St. John's wort This list may not describe all possible interactions. Give your health care provider a list of all the medicines, herbs, non-prescription drugs, or dietary supplements you use. Also tell them if you smoke, drink alcohol, or use illegal drugs. Some items may interact with your medicine. What should I watch for while using this medicine? This product does not protect you against HIV infection (AIDS) or other sexually transmitted diseases. You should be able to feel the implant by pressing your fingertips over the skin where it was inserted. Contact your doctor if you cannot feel the implant, and use a non-hormonal birth control method (such as condoms) until your doctor confirms that the implant is in place. If you feel that the implant may have broken or become bent while in your arm, contact your healthcare provider. What side effects may I notice from receiving this medicine? Side effects that you should report to your doctor or health care professional as soon as possible: -allergic reactions like skin rash, itching or hives, swelling of the face, lips, or tongue -breast lumps -changes in emotions or moods -depressed mood -heavy or prolonged menstrual bleeding -pain, irritation, swelling, or bruising at the insertion site -scar at site of insertion -signs of infection at the insertion site such as fever, and skin redness, pain or discharge -signs of pregnancy -signs and symptoms of a blood clot such as breathing problems; changes in vision; chest pain; severe, sudden headache; pain, swelling, warmth in the leg; trouble speaking; sudden numbness or weakness of the face, arm or leg -signs and symptoms of liver injury like dark yellow or brown urine; general ill feeling or flu-like   symptoms; light-colored stools; loss of  appetite; nausea; right upper belly pain; unusually weak or tired; yellowing of the eyes or skin -unusual vaginal bleeding, discharge -signs and symptoms of a stroke like changes in vision; confusion; trouble speaking or understanding; severe headaches; sudden numbness or weakness of the face, arm or leg; trouble walking; dizziness; loss of balance or coordination Side effects that usually do not require medical attention (Report these to your doctor or health care professional if they continue or are bothersome.): -acne -back pain -breast pain -changes in weight -dizziness -general ill feeling or flu-like symptoms -headache -irregular menstrual bleeding -nausea -sore throat -vaginal irritation or inflammation This list may not describe all possible side effects. Call your doctor for medical advice about side effects. You may report side effects to FDA at 1-800-FDA-1088. Where should I keep my medicine? This drug is given in a hospital or clinic and will not be stored at home. NOTE: This sheet is a summary. It may not cover all possible information. If you have questions about this medicine, talk to your doctor, pharmacist, or health care provider.    2016, Elsevier/Gold Standard. (2014-09-28 14:07:06)  

## 2015-12-16 NOTE — Progress Notes (Signed)
Patient ID: Rebekah [name], female   DOB: 01/04/1991, 24 y.o.   MRN: [id number] Subjective:    Rebekah [name] is a 24 y.o. 312P2002 Caucasian female who presents for a postpartum visit. She is 5 weeks postpartum following a [facility]. Was 4cm when she got there, and had the baby maybe 1 hour later. Anesthesia: none. Had to have manual removal of placenta in OR. I have fully reviewed the prenatal and intrapartum course. Postpartum course has been uncomplicated. Baby's course has been uncomplicated. Baby is feeding by pumped for first few weeks, now bottle. Bleeding no bleeding. Bowel function is normal. Bladder function is normal. Patient is sexually active. Last sexual activity: few days ago. Contraception method is condoms and wants nexplanon. Postpartum depression screening: negative. Score 2.  Last pap 8/15 and was neg.  The following portions of the patient's history were reviewed and updated as appropriate: allergies, current medications, past medical history, past surgical history and problem list.  Review of Systems Pertinent items are noted in HPI.   Filed Vitals:   12/16/15 1150  BP: 120/80  Pulse: 96  Height: 5\' 2"  (1.575 m)  Weight: 127 lb 8 oz (57.834 kg)   Patient's last menstrual period was 01/27/2015.  Objective:   General:  alert, cooperative and no distress   Breasts:  deferred, no complaints  Lungs: clear to auscultation bilaterally  Heart:  regular rate and rhythm  Abdomen: soft, nontender   Vulva: normal  Vagina: normal vagina  Cervix:  closed  Corpus: Well-involuted  Adnexa:  Non-palpable  Rectal Exam: No hemorrhoids        Assessment:   [facility]partum [facility] Bottle[facility] Depression screening Contraception counseling   Plan:   Contraception: abstinence until nexplanon insertion, order today Follow up in: 3 weeks for nexplanon  insertion, or earlier if needed  Marge DuncansBooker, Nashika Coker Randall CNM, [facility] 12/16/2015 12:15 PM

## 2016-01-01 ENCOUNTER — Encounter (HOSPITAL_COMMUNITY): Payer: Self-pay | Admitting: *Deleted

## 2016-01-01 ENCOUNTER — Emergency Department (HOSPITAL_COMMUNITY)
Admission: EM | Admit: 2016-01-01 | Discharge: 2016-01-01 | Disposition: A | Discharge status: Home / Self Care | Payer: BLUE CROSS/BLUE SHIELD | Attending: Emergency Medicine | Admitting: Emergency Medicine

## 2016-01-01 DIAGNOSIS — J069 Acute upper respiratory infection, unspecified: Secondary | ICD-10-CM | POA: Diagnosis not present

## 2016-01-01 DIAGNOSIS — G8929 Other chronic pain: Secondary | ICD-10-CM | POA: Diagnosis not present

## 2016-01-01 DIAGNOSIS — Z79891 Long term (current) use of opiate analgesic: Secondary | ICD-10-CM | POA: Diagnosis not present

## 2016-01-01 DIAGNOSIS — F17210 Nicotine dependence, cigarettes, uncomplicated: Secondary | ICD-10-CM | POA: Diagnosis not present

## 2016-01-01 DIAGNOSIS — Z8742 Personal history of other diseases of the female genital tract: Secondary | ICD-10-CM | POA: Diagnosis not present

## 2016-01-01 DIAGNOSIS — M791 Myalgia: Secondary | ICD-10-CM | POA: Insufficient documentation

## 2016-01-01 DIAGNOSIS — R0981 Nasal congestion: Secondary | ICD-10-CM | POA: Diagnosis present

## 2016-01-01 MED ORDER — GUAIFENESIN-CODEINE 100-10 MG/5ML PO SYRP: 10.0000 mL | ORAL_SOLUTION | Freq: Three times a day (TID) | ORAL | Status: DC | PRN

## 2016-01-01 MED ORDER — ALBUTEROL SULFATE HFA 108 (90 BASE) MCG/ACT IN AERS
2.0000 | INHALATION_SPRAY | Freq: Once | RESPIRATORY_TRACT | Status: AC
  Administered 2016-01-01: 2 via RESPIRATORY_TRACT
  Filled 2016-01-01: qty 6.7

## 2016-01-01 NOTE — ED Notes (Signed)
Pt comes in with cough and nasal congestion starting yesterday morning. Pt has had intermittent fevers and has been taking ibuprofen.

## 2016-01-01 NOTE — Discharge Instructions (Signed)

## 2016-01-05 NOTE — ED Provider Notes (Signed)
CSN: [id number]     Arrival date & time 01/01/16  1754 History   First MD Initiated Contact with Patient 01/01/16 1801     Chief Complaint  Patient presents with  . Cough  . Nasal Congestion     (Consider location/radiation/quality/duration/timing/severity/associated sxs/prior Treatment) HPI  Rebekah [name] is a 25 y.o. female who presents to the Emergency Department complaining of cough, nasal congestion with intermittent fever.  Symptoms present for one day.  She reports cough as non productive.  She has taken tylenol without relief.  She denies shortness of breath, chest pain, abdominal pain, vomiting or diarrhea.     Past Medical History  Diagnosis Date  . Ovarian cyst   . Chronic back pain   . Chronic pelvic pain in female   . Implanon removal 01/30/2015  . Contraceptive management 01/30/2015  . Pregnant 03/06/2015  . Nausea and vomiting during pregnancy 03/06/2015   Past Surgical History  Procedure Laterality Date  . Cholecystectomy  07/31/2011    Procedure: LAPAROSCOPIC CHOLECYSTECTOMY;  Surgeon: Fabio BeringBrent C Ziegler;  Location: [facility];  Service: General;  Laterality: N/A;  . Placenta removed  11/08/15   Family History  Problem Relation Age of Onset  . Cancer Mother     cervical  . Hypertension Mother   . Diabetes Mother     pre-diabetes  . Cancer Maternal Aunt     cervical  . Cancer Maternal Grandmother     cervical  . Heart disease Maternal Grandfather   . Hypertension Maternal Grandfather   . Diabetes Maternal Grandfather   . Hyperlipidemia Maternal Grandfather   . Cancer Paternal Grandmother     cervial, breast  . Diabetes Paternal Grandmother   . Hypertension Paternal Grandmother    Social History  Substance Use Topics  . Smoking status: Current Every Day Smoker -- 1.00 packs/day for 8 years    Types: Cigarettes  . Smokeless tobacco: Never Used  . Alcohol Use: No     Comment: not now   OB History    Gravida Para Term Preterm AB TAB SAB Ectopic Multiple Living    2 2 2       2      Review of Systems  Constitutional: Positive for fever. Negative for chills, activity change and appetite change.  HENT: Positive for congestion and rhinorrhea. Negative for facial swelling, sore throat and trouble swallowing.   Eyes: Negative for visual disturbance.  Respiratory: Positive for cough. Negative for shortness of breath, wheezing and stridor.   Gastrointestinal: Negative for nausea, vomiting and abdominal pain.  Genitourinary: Negative for dysuria.  Musculoskeletal: Positive for myalgias. Negative for neck pain and neck stiffness.  Skin: Negative.   Neurological: Negative for dizziness, weakness, numbness and headaches.  Hematological: Negative for adenopathy.  Psychiatric/Behavioral: Negative for confusion.  All other systems reviewed and are negative.     Allergies  Review of patient's allergies indicates no known allergies.  Home Medications   Prior to Admission medications   Medication Sig Start Date End Date Taking? Authorizing Provider  acetaminophen (TYLENOL) 500 MG tablet Take 500 mg by mouth as needed.    Historical Provider, MD  guaiFENesin-codeine (ROBITUSSIN AC) 100-10 MG/5ML syrup Take 10 mLs by mouth 3 (three) times daily as needed. 01/01/16   Lorrain Rivers, PA-C  methadone (DOLOPHINE) 10 MG tablet Take 130 mg by mouth daily.     Historical Provider, MD   BP 129/70 mmHg  Pulse 109  Temp(Src) 100.1 F (37.8 C) (Oral)  Resp 24  Ht 5\' 2"  (1.575 m)  Wt 60.737 kg  BMI 24.48 kg/m2  SpO2 97% Physical Exam  Constitutional: She is oriented to person, place, and time. She appears well-developed and well-nourished. No distress.  HENT:  Head: Normocephalic and atraumatic.  Right Ear: Tympanic membrane and ear canal normal.  Left Ear: Tympanic membrane and ear canal normal.  Nose: Mucosal edema and rhinorrhea present.  Mouth/Throat: Uvula is midline and mucous membranes are normal. No trismus in the jaw. No uvula swelling. Posterior  oropharyngeal erythema present. No oropharyngeal exudate, posterior oropharyngeal edema or tonsillar abscesses.  Eyes: Conjunctivae are normal.  Neck: Normal range of motion and phonation normal. Neck supple. No Brudzinski's sign and no Kernig's sign noted.  Cardiovascular: Normal rate, regular rhythm, normal heart sounds and intact distal pulses.   No murmur heard. Pulmonary/Chest: Effort normal and breath sounds normal. No respiratory distress. She has no wheezes. She has no rales.  Coarse lung sounds w/o rales or obvious wheezing  Abdominal: Soft. She exhibits no distension. There is no tenderness. There is no rebound and no guarding.  Musculoskeletal: Normal range of motion. She exhibits no edema.  Lymphadenopathy:    She has no cervical adenopathy.  Neurological: She is alert and oriented to person, place, and time. She exhibits normal muscle tone. Coordination normal.  Skin: Skin is warm and dry.  Psychiatric: She has a normal mood and affect.  Nursing note and vitals reviewed.   ED Course  Procedures (including critical care time) Labs Review Labs Reviewed - No data to display  Imaging Review No results found. I have personally reviewed and evaluated these images and lab results as part of my medical decision-making.   EKG Interpretation None      MDM   Final diagnoses:  URI (upper respiratory infection)    Pt is non-toxic appearing.  Airway patent.  Sx's likely viral process.  rx for robitussin AC.  Advised to take ibuprofen and tylenol, close PMD f/u if needed.    Pauline Aus, PA-C 01/05/16 0036  Bethann Berkshire, MD 01/06/16 1525

## 2016-01-06 ENCOUNTER — Encounter: Payer: BLUE CROSS/BLUE SHIELD | Admitting: Women's Health

## 2016-01-15 ENCOUNTER — Ambulatory Visit (INDEPENDENT_AMBULATORY_CARE_PROVIDER_SITE_OTHER): Payer: BLUE CROSS/BLUE SHIELD | Admitting: Women's Health

## 2016-01-15 ENCOUNTER — Encounter: Payer: Self-pay | Admitting: Women's Health

## 2016-01-15 VITALS — BP 92/60 | HR 80 | Wt 130.0 lb

## 2016-01-15 DIAGNOSIS — Z30017 Encounter for initial prescription of implantable subdermal contraceptive: Secondary | ICD-10-CM | POA: Insufficient documentation

## 2016-01-15 DIAGNOSIS — Z3202 Encounter for pregnancy test, result negative: Secondary | ICD-10-CM | POA: Diagnosis not present

## 2016-01-15 LAB — POCT URINE PREGNANCY: Preg Test, Ur: NEGATIVE

## 2016-01-15 NOTE — Patient Instructions (Signed)

## 2016-01-15 NOTE — Progress Notes (Signed)
Patient ID: [name], female   DOB: [birth year], 25 y.o.   MRN: [id number] Rebekah [name] is a 25 y.o. year old Caucasian female here for Nexplanon insertion.  Patient's last menstrual period was 01/06/2016., last sexual intercourse was >2wks ago, and her pregnancy test today was negative.  Risks/benefits/side effects of Nexplanon have been discussed and her questions have been answered.  Specifically, a failure rate of 12/998 has been reported, with an increased failure rate if pt takes St. John's Wort and/or antiseizure medicaitons.  Rebekah [name] is aware of the common side effect of irregular bleeding, which the incidence of decreases over time.  BP 92/60 mmHg  Pulse 80  Wt 130 lb (58.968 kg)  LMP 01/06/2016  Breastfeeding? No  Results for orders placed or performed in visit on 01/15/16 (from the past 24 hour(s))  POCT urine pregnancy   Collection Time: 01/15/16 12:00 PM  Result Value Ref Range   Preg Test, Ur Negative Negative     She is right-handed, so her left arm, approximately 4 inches proximal from the elbow, was cleansed with alcohol and anesthetized with 2cc of 2% Lidocaine.  The area was cleansed again with betadine and the Nexplanon was inserted per manufacturer's recommendations without difficulty.  A steri-strip and pressure bandage were applied.  Pt was instructed to keep the area clean and dry, remove pressure bandage in 24 hours, and keep insertion site covered with the steri-strip for 3-5 days.  Back up contraception was recommended for 2 weeks.  She was given a card indicating date Nexplanon was inserted and date it needs to be removed. Follow-up PRN problems.  Marge Duncans CNM, [facility] 01/15/2016 12:15 PM

## 2022-08-12 ENCOUNTER — Ambulatory Visit: Payer: Medicaid Other | Admitting: Advanced Practice Midwife

## 2022-08-14 ENCOUNTER — Ambulatory Visit: Payer: Medicaid Other | Admitting: Advanced Practice Midwife

## 2022-08-19 ENCOUNTER — Encounter: Payer: Medicaid Other | Admitting: Advanced Practice Midwife

## 2022-10-01 ENCOUNTER — Other Ambulatory Visit (HOSPITAL_COMMUNITY)
Admission: RE | Admit: 2022-10-01 | Discharge: 2022-10-01 | Disposition: A | Discharge status: Home / Self Care | Payer: Medicaid Other | Source: Ambulatory Visit | Attending: Obstetrics & Gynecology | Admitting: Obstetrics & Gynecology

## 2022-10-01 ENCOUNTER — Ambulatory Visit (INDEPENDENT_AMBULATORY_CARE_PROVIDER_SITE_OTHER): Payer: Medicaid Other | Admitting: Obstetrics & Gynecology

## 2022-10-01 ENCOUNTER — Encounter: Payer: Self-pay | Admitting: Obstetrics & Gynecology

## 2022-10-01 VITALS — BP 104/58 | HR 104 | Ht 62.0 in | Wt 111.4 lb

## 2022-10-01 DIAGNOSIS — Z01419 Encounter for gynecological examination (general) (routine) without abnormal findings: Secondary | ICD-10-CM | POA: Insufficient documentation

## 2022-10-01 NOTE — Progress Notes (Signed)
   WELL-WOMAN EXAMINATION Patient name: Rebekah [name] MRN [id number]  Date of birth: 27-Feb-1991 Chief Complaint:   Gynecologic Exam  History of Present Illness:   Rebekah [name] is a 31 y.o. G45P2002 female being seen today for a routine well-woman exam.   Menses can be irregular sometimes light sometime heavy, sometimes beginning, sometimes end of the month.  With the Nexplanon this has been normal for her and does not report an issue with this.  Pelvic pain: Last month she has noted pain on her right side- pain is constant, but worse when "straining."  Rates pain 5/10.  No nausea/vomiting.  She has had a cyst in the past and is concerned that is may be present again.  No fever or chills. She does struggle with constipation.   No LMP recorded. Patient has had an implant. Denies issues with her menses The current method of family planning is Nexplanon.    Last pap collected today.  Last mammogram: n/a. Last colonoscopy: n/a      No data to display            Review of Systems:   Pertinent items are noted in HPI Denies any headaches, blurred vision, fatigue, shortness of breath, chest pain, abdominal pain, bowel movements, urination, or intercourse unless otherwise stated above.  Pertinent History Reviewed:  Reviewed past medical,surgical, social and family history.  Reviewed problem list, medications and allergies. Physical Assessment:   Vitals:   10/01/22 1150  BP: (!) 104/58  Pulse: (!) 104  Weight: 111 lb 6.4 oz (50.5 kg)  Height: 5\' 2"  (1.575 m)  Body mass index is 20.38 kg/m.        Physical Examination:   General appearance - well appearing, and in no distress  Mental status - alert, oriented to person, place, and time  Psych:  She has a normal mood and affect  Skin - warm and dry, normal color, no suspicious lesions noted  Chest - effort normal, all lung fields clear to auscultation bilaterally  Heart - normal rate and regular rhythm  Neck:  midline  trachea, no thyromegaly or nodules  Breasts - breasts appear normal, no suspicious masses, no skin or nipple changes or  axillary nodes  Abdomen - soft, nontender, nondistended, no masses or organomegaly  Pelvic - VULVA: normal appearing vulva with no masses, tenderness or lesions  VAGINA: normal appearing vagina with normal color and discharge, no lesions  CERVIX: normal appearing cervix without discharge or lesions, no CMT  Thin prep pap is done with HR HPV cotesting  UTERUS: uterus is felt to be normal size, shape, consistency and nontender   ADNEXA: No adnexal masses or tenderness noted.  Extremities:  No lower extremity edema, no calf tenderness bilaterally  Chaperone: Celene Squibb     Assessment & Plan:  1) Well-Woman Exam -pap collected, reviewed screening guidelines  2) Contraception/family planning -Nexplanon removal scheduled -discussed starting PNV daily  3) Pelvic pain -no acute abnormalities noted on exam -if pain persists/worsens, may consider ordering [place] at next visit   Meds: No orders of the defined types were placed in this encounter.   Follow-up: Return for as scheduled for Nexplanon removal.   Janyth Pupa, DO Attending [place], [facility] for [employer], [place]

## 2022-10-06 LAB — CYTOLOGY - PAP
Chlamydia: NEGATIVE
Comment: NEGATIVE
Comment: NEGATIVE
Comment: NEGATIVE
Comment: NEGATIVE
Comment: NORMAL
Diagnosis: UNDETERMINED — AB
HPV 16: NEGATIVE
HPV 18 / 45: POSITIVE — AB
High risk HPV: POSITIVE — AB
Neisseria Gonorrhea: NEGATIVE

## 2022-10-07 ENCOUNTER — Telehealth: Payer: Self-pay | Admitting: *Deleted

## 2022-10-07 NOTE — Telephone Encounter (Signed)
Attempted to reach patient regarding recent pap smear results.  Both numbers in chart not in service.

## 2022-10-12 ENCOUNTER — Encounter: Payer: Self-pay | Admitting: Women's Health

## 2022-10-12 ENCOUNTER — Ambulatory Visit (INDEPENDENT_AMBULATORY_CARE_PROVIDER_SITE_OTHER): Payer: Medicaid Other | Admitting: Women's Health

## 2022-10-12 VITALS — BP 114/72 | HR 92 | Ht 62.0 in | Wt 109.0 lb

## 2022-10-12 DIAGNOSIS — R87610 Atypical squamous cells of undetermined significance on cytologic smear of cervix (ASC-US): Secondary | ICD-10-CM

## 2022-10-12 DIAGNOSIS — Z3046 Encounter for surveillance of implantable subdermal contraceptive: Secondary | ICD-10-CM | POA: Diagnosis not present

## 2022-10-12 NOTE — Patient Instructions (Signed)
Keep the area clean and dry.  You can remove the big bandage in 24 hours, and the small steri-strip bandage in 3-5 days.  NO SEX until after your next appointment

## 2022-10-12 NOTE — Progress Notes (Signed)
   [place] REMOVAL Patient name: Rebekah [name] MRN [id number]  Date of birth: 1991-08-31 Subjective Findings:   Rebekah [name] is a 31 y.o. G25P2002 Caucasian female being seen today for removal of a Nexplanon. Her Nexplanon was placed 01/15/16.  She desires removal because she wants to get pregnant and Nexplanon is past due for removal. Signed copy of informed consent in chart.   No LMP recorded. Patient has had an implant. Last pap 10/01/22. Results were: ASCUS w/ HRHPV positive: 18/45, they were unable to reach her to schedule colpo. She was notified of abnormal pap/need for colpo today, gave choice of nexplanon removal vs colpo today, has daughter w/ her and they are late for something, so wants to do nexplanon today and will make appt for colpo asap.  The planned method of family planning is none      No data to display               No data to display           Pertinent History Reviewed:   Reviewed past medical,surgical, social, obstetrical and family history.  Reviewed problem list, medications and allergies. Objective Findings & Procedure:    Vitals:   10/12/22 1130  BP: 114/72  Pulse: 92  Weight: 109 lb (49.4 kg)  Height: 5\' 2"  (1.575 m)  Body mass index is 19.94 kg/m.  No results found for this or any previous visit (from the past 24 hour(s)).   Time out was performed.  Nexplanon site identified.  Area prepped in usual sterile fashon. One cc of 2% lidocaine was used to anesthetize the area at the distal end of the implant. A small stab incision was made right beside the implant on the distal portion.  The Nexplanon rod was grasped using hemostats and removed without difficulty.  There was less than 3 cc blood loss. There were no complications.  Steri-strips were applied over the small incision and a pressure bandage was applied.  The patient tolerated the procedure well. Assessment & Plan:   1) Nexplanon removal She was instructed to keep the area clean and  dry, remove pressure bandage in 24 hours, and keep insertion site covered with the steri-strip for 3-5 days.   Follow-up PRN problems. Plans pregnancy, no sex until after colpo  2) Abnormal pap> needs colpo asap, declined today.   No orders of the defined types were placed in this encounter.   Follow-up: Return for scheduel colpo ASAP.  [place], [facility] 10/12/2022 12:12 PM

## 2022-10-13 ENCOUNTER — Encounter: Payer: Self-pay | Admitting: Women's Health

## 2022-10-13 ENCOUNTER — Other Ambulatory Visit (HOSPITAL_COMMUNITY)
Admission: RE | Admit: 2022-10-13 | Discharge: 2022-10-13 | Disposition: A | Discharge status: Home / Self Care | Payer: Medicaid Other | Source: Ambulatory Visit | Attending: Women's Health | Admitting: Women's Health

## 2022-10-13 ENCOUNTER — Ambulatory Visit (INDEPENDENT_AMBULATORY_CARE_PROVIDER_SITE_OTHER): Payer: Medicaid Other | Admitting: Women's Health

## 2022-10-13 VITALS — BP 106/71 | HR 98 | Ht 63.0 in | Wt 109.0 lb

## 2022-10-13 DIAGNOSIS — N871 Moderate cervical dysplasia: Secondary | ICD-10-CM

## 2022-10-13 DIAGNOSIS — R87610 Atypical squamous cells of undetermined significance on cytologic smear of cervix (ASC-US): Secondary | ICD-10-CM | POA: Diagnosis not present

## 2022-10-13 DIAGNOSIS — Z3202 Encounter for pregnancy test, result negative: Secondary | ICD-10-CM | POA: Diagnosis not present

## 2022-10-13 DIAGNOSIS — R87619 Unspecified abnormal cytological findings in specimens from cervix uteri: Secondary | ICD-10-CM | POA: Insufficient documentation

## 2022-10-13 LAB — POCT URINE PREGNANCY: Preg Test, Ur: NEGATIVE

## 2022-10-13 NOTE — Patient Instructions (Signed)
Colposcopy, Care After  The following information offers guidance on how to care for yourself after your procedure. Your health care provider may also give you more specific instructions. If you have problems or questions, contact your health care provider. What can I expect after the procedure? If you had a colposcopy without a biopsy, you can expect to feel fine right away after your procedure. However, you may have some spotting of blood for a few days. You can return to your normal activities. If you had a colposcopy with a biopsy, it is common after the procedure to have: Soreness and mild pain. These may last for a few days. Mild vaginal bleeding or discharge that is dark-colored and grainy. This may last for a few days. The discharge may be caused by a liquid (solution) that was used during the procedure. You may need to wear a sanitary pad during this time. Spotting of blood for at least 48 hours after the procedure. Follow these instructions at home: Medicines Take over-the-counter and prescription medicines only as told by your health care provider. Talk with your health care provider about what type of over-the-counter pain medicines and prescription medicines you can start to take again. It is especially important to talk with your health care provider if you take blood thinners. Activity Avoid using douche products, using tampons, and having sex for at least 3 days after the procedure or for as long as told by your health care provider. Return to your normal activities as told by your health care provider. Ask your health care provider what activities are safe for you. General instructions Ask your health care provider if you may take baths, swim, or use a hot tub. You may take showers. If you use birth control (contraception), continue to use it. Keep all follow-up visits. This is important. Contact a health care provider if: You have a fever or chills. You faint or feel  light-headed. Get help right away if: You have heavy bleeding from your vagina or pass blood clots. Heavy bleeding is bleeding that soaks through a sanitary pad in less than 1 hour. You have vaginal discharge that is abnormal, is yellow in color, or smells bad. This could be a sign of infection. You have severe pain or cramps in your lower abdomen that do not go away with medicine. Summary If you had a colposcopy without a biopsy, you can expect to feel fine right away, but you may have some spotting of blood for a few days. You can return to your normal activities. If you had a colposcopy with a biopsy, it is common to have mild pain for a few days and spotting for 48 hours after the procedure. Avoid using douche products, using tampons, and having sex for at least 3 days after the procedure or for as long as told by your health care provider. Get help right away if you have heavy bleeding, severe pain, or signs of infection. This information is not intended to replace advice given to you by your health care provider. Make sure you discuss any questions you have with your health care provider. Document Revised: 05/11/2021 Document Reviewed: 05/11/2021 Elsevier Patient Education  2023 Elsevier Inc.  

## 2022-10-13 NOTE — Progress Notes (Signed)
   COLPOSCOPY PROCEDURE NOTE Patient name: Rebekah [name] MRN [id number]  Date of birth: [birth year] Subjective Findings:   Rebekah [name] is a 31 y.o. G35P2002 Caucasian female being seen today for a colposcopy. Indication: Abnormal pap on 10/01/22: ASCUS w/ HRHPV positive: 18/45  Prior cytology:  Date Result Procedure  2015 neg None              Patient's last menstrual period was 10/09/2022 (approximate). Contraception: none, removed Nexplanon yesterday, wants to get pregnant. Menopausal: no. Hysterectomy: no.   Smoker: yes. Immunocompromised: no.   The risks and benefits were explained and informed consent was obtained, and written copy is in chart. Pertinent History Reviewed:   Reviewed past medical,surgical, social, obstetrical and family history.  Reviewed problem list, medications and allergies. Objective Findings & Procedure:   Vitals:   10/13/22 1502  BP: 106/71  Pulse: 98  Weight: 109 lb (49.4 kg)  Height: 5\' 3"  (1.6 m)  Body mass index is 19.31 kg/m.  Results for orders placed or performed in visit on 10/13/22 (from the past 24 hour(s))  POCT urine pregnancy   Collection Time: 10/13/22  3:06 PM  Result Value Ref Range   Preg Test, Ur Negative Negative     Time out was performed.  Speculum placed in the vagina, cervix fully visualized, on period, not heavy, wiped menstrual blood from cervix SCJ: not fully visualized. Cervix swabbed x 3 with acetic acid.  Acetowhitening present: Yes Cervix: no visible lesions, no mosaicism, no punctation, no abnormal vasculature, and acetowhite lesion(s) noted at 4 & 6 o'clock. Endocervical curettage performed, Cervical biopsies taken at 4 & 7 o'clock, brisk bleeding from 4 o'clock biopsy, filled vault, cleared and Hemostasis achieved with Monsel's solution. Vagina: vaginal colposcopy not performed Vulva: vulvar colposcopy not performed  Specimens: 3  Complications: bleeding  Chaperone: Alice Rieger    Colposcopic  Impression & Plan:   Colposcopy findings consistent with LSIL Plan: Post biopsy instructions given, Will notify patient of results when back, and Will base plan of care on pathology results and ASCCP guidelines No sex until results back As long as LSIL, ok to begin trying for pregnancy Take pnv daily  No follow-ups on file.  [place], [facility] 10/13/2022 3:40 PM

## 2022-10-13 NOTE — Addendum Note (Signed)
Addended by: Octaviano Glow on: 10/13/2022 03:45 PM   Modules accepted: Orders

## 2022-10-19 LAB — SURGICAL PATHOLOGY

## 2022-10-23 ENCOUNTER — Encounter: Payer: Self-pay | Admitting: Obstetrics & Gynecology

## 2022-10-23 ENCOUNTER — Ambulatory Visit (INDEPENDENT_AMBULATORY_CARE_PROVIDER_SITE_OTHER): Payer: Medicaid Other | Admitting: Obstetrics & Gynecology

## 2022-10-23 VITALS — BP 110/70 | HR 102 | Ht 62.0 in | Wt 109.0 lb

## 2022-10-23 DIAGNOSIS — N870 Mild cervical dysplasia: Secondary | ICD-10-CM | POA: Diagnosis not present

## 2022-10-23 DIAGNOSIS — Z720 Tobacco use: Secondary | ICD-10-CM

## 2022-10-23 DIAGNOSIS — N871 Moderate cervical dysplasia: Secondary | ICD-10-CM | POA: Diagnosis not present

## 2022-10-23 DIAGNOSIS — Z3009 Encounter for other general counseling and advice on contraception: Secondary | ICD-10-CM

## 2022-10-23 NOTE — Progress Notes (Signed)
   GYN VISIT Patient name: Rebekah [name] MRN [id number]  Date of birth: August 26, 1991 Chief Complaint:   discuss colpo results  History of Present Illness:   Rebekah [name] is a 31 y.o. 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      No data to display           Review of Systems:   Pertinent items are noted in HPI Denies fever/chills, dizziness, headaches, visual disturbances, fatigue, shortness of breath, chest pain, abdominal pain, vomiting, no problems with periods, bowel movements, urination, or intercourse unless otherwise stated above.  Pertinent History Reviewed:  Reviewed past medical,surgical, social, obstetrical and family history.  Reviewed problem list, medications and allergies. Physical Assessment:   Vitals:   10/23/22 1031  BP: 110/70  Pulse: (!) 102  Weight: 109 lb (49.4 kg)  Height: 5\' 2"  (1.575 m)  Body mass index is 19.94 kg/m.       Physical Examination:   General appearance: alert, well appearing, and in no distress  Psych: mood appropriate, normal affect  Skin: warm & dry   Cardiovascular: normal heart rate noted  Respiratory: normal respiratory effort, no distress  Pelvic: examination not indicated  Chaperone: N/A    Assessment & Plan:  1) Cervical dysplasia, CIN 1, CIN 2 -reviewed recent colposcopy results -reviewed ASCCP guidelines of excision procedure- reviewed risk/benefit including risk of cervical insufficiency -discussed that for women with concerns for future pregnancy may consider conservative monitoring with colposcopy at 6 and 56mos -discussed potential risk of conservative treatment- ie progression of dysplasia -questions and concerns were addressed -pt strongly desires conservative treatment, plan for colposcopy  in 42mos  2) Family planning -encouraged PNV daily -ok to start trying for pregnancy now  3) Tobacco use -encouraged pt to quit as this is a risk factor for dysplasia/cervical carcinoma   Return in about 6 months (around 04/24/2023) for colposcopy- Tiani Stanbery.   Janyth Pupa, DO Attending [place], [facility] for [employer], [place]

## 2022-11-13 ENCOUNTER — Ambulatory Visit (INDEPENDENT_AMBULATORY_CARE_PROVIDER_SITE_OTHER): Payer: Medicaid Other | Admitting: *Deleted

## 2022-11-13 ENCOUNTER — Encounter: Payer: Self-pay | Admitting: *Deleted

## 2022-11-13 ENCOUNTER — Other Ambulatory Visit: Payer: Self-pay | Admitting: Adult Health

## 2022-11-13 VITALS — BP 109/69 | HR 106 | Ht 62.0 in | Wt 111.0 lb

## 2022-11-13 DIAGNOSIS — Z3201 Encounter for pregnancy test, result positive: Secondary | ICD-10-CM | POA: Diagnosis not present

## 2022-11-13 LAB — POCT URINE PREGNANCY: Preg Test, Ur: POSITIVE — AB

## 2022-11-13 MED ORDER — PROMETHAZINE HCL 12.5 MG PO TABS: 12.5000 mg | ORAL_TABLET | Freq: Four times a day (QID) | ORAL | 0 refills | Status: DC | PRN

## 2022-11-13 NOTE — Progress Notes (Signed)
Will rx phenergan  

## 2022-11-13 NOTE — Progress Notes (Signed)
   NURSE VISIT- PREGNANCY CONFIRMATION   SUBJECTIVE:  Rebekah [name] is a 31 y.o. G39P2002 female at [redacted]w[redacted]d by certain LMP of Patient's last menstrual period was 10/22/2022. Here for pregnancy confirmation.  Home pregnancy test: positive x 4.   She reports nausea and vomiting.  She is taking prenatal vitamins.    OBJECTIVE:  BP 109/69 (BP Location: Left Arm, Patient Position: Sitting, Cuff Size: Normal)   Pulse (!) 106   Ht 5\' 2"  (1.575 m)   Wt 111 lb (50.3 kg)   LMP 10/22/2022   BMI 20.30 kg/m   Appears well, in no apparent distress  Results for orders placed or performed in visit on 11/13/22 (from the past 24 hour(s))  POCT urine pregnancy   Collection Time: 11/13/22 11:23 AM  Result Value Ref Range   Preg Test, Ur Positive (A) Negative    ASSESSMENT: Positive pregnancy test, [redacted]w[redacted]d by LMP    PLAN: Schedule for dating ultrasound in 5 weeks Prenatal vitamins: continue   Nausea medicines: requested-note routed to [facility] to send prescription   OB packet given: Yes  [redacted]w[redacted]d  11/13/2022 12:08 PM

## 2022-12-17 ENCOUNTER — Other Ambulatory Visit: Payer: Self-pay | Admitting: Obstetrics & Gynecology

## 2022-12-17 DIAGNOSIS — O3680X0 Pregnancy with inconclusive fetal viability, not applicable or unspecified: Secondary | ICD-10-CM

## 2022-12-18 ENCOUNTER — Encounter: Payer: Self-pay | Admitting: *Deleted

## 2022-12-18 ENCOUNTER — Ambulatory Visit (INDEPENDENT_AMBULATORY_CARE_PROVIDER_SITE_OTHER): Payer: Medicaid Other | Admitting: *Deleted

## 2022-12-18 ENCOUNTER — Ambulatory Visit (INDEPENDENT_AMBULATORY_CARE_PROVIDER_SITE_OTHER): Payer: Medicaid Other

## 2022-12-18 VITALS — BP 104/72 | HR 93 | Wt 110.0 lb

## 2022-12-18 DIAGNOSIS — Z3A08 8 weeks gestation of pregnancy: Secondary | ICD-10-CM

## 2022-12-18 DIAGNOSIS — O0990 Supervision of high risk pregnancy, unspecified, unspecified trimester: Secondary | ICD-10-CM | POA: Insufficient documentation

## 2022-12-18 DIAGNOSIS — Z3490 Encounter for supervision of normal pregnancy, unspecified, unspecified trimester: Secondary | ICD-10-CM | POA: Insufficient documentation

## 2022-12-18 DIAGNOSIS — O3680X0 Pregnancy with inconclusive fetal viability, not applicable or unspecified: Secondary | ICD-10-CM

## 2022-12-18 DIAGNOSIS — Z3480 Encounter for supervision of other normal pregnancy, unspecified trimester: Secondary | ICD-10-CM

## 2022-12-18 NOTE — Progress Notes (Cosign Needed Addendum)
   INITIAL OB NURSE INTAKE  SUBJECTIVE:  Rebekah [name] is a 31 y.o. G12P2002 female [redacted]w[redacted]d by today's U/S with an Estimated Date of Delivery: 07/29/23 being seen today for her initial OB intake/educational visit with RN. She is taking prenatal vitamins.  She is having nausea and/or vomiting and does request nausea meds at this time(needs refill).  Patient's last menstrual period was 10/22/2022.  Patient's medical, surgical, and obstetrical history obtained and reviewed.  Current medications reviewed.   Patient Active Problem List   Diagnosis Date Noted   Abnormal Pap smear of cervix 10/13/2022   Nexplanon insertion 01/15/2016   Chronic pelvic pain in female    Chronic back pain    Past Medical History:  Diagnosis Date   Chronic back pain    Chronic pelvic pain in female    Contraceptive management 01/30/2015   Implanon removal 01/30/2015   Nausea and vomiting during pregnancy 03/06/2015   Ovarian cyst    Pregnant 03/06/2015   Past Surgical History:  Procedure Laterality Date   CHOLECYSTECTOMY  07/31/2011   Procedure: LAPAROSCOPIC CHOLECYSTECTOMY;  Surgeon: Fabio Bering;  Location: [facility];  Service: General;  Laterality: N/A;   placenta removed  11/08/15   OB History     Gravida  3   Para  2   Term  2   Preterm      AB      Living  2      SAB      IAB      Ectopic      Multiple      Live Births  2           OBJECTIVE:  LMP 10/22/2022   ASSESSMENT/PLAN: [id number] at [redacted]w[redacted]d with an Estimated Date of Delivery: 07/29/23  Prenatal vitamins: continue   Nausea medicines: REQUESTS RX refill on Phenergan OB packet given: No BabyScripts and MyChart activated  Blood Pressure Cuff: has at home. Discussed to be used for virtual visits and home BP checks.  Genetic & carrier screening discussed: requests Panorama, NT/IT, and Horizon  Placed OB Box on problem list and updated Reviewed recommended weight gain based on pre-gravid BMI BMI 18.5-24.9, gain max  25-35lb  Follow-up in 3 weeks for NT U/S & New OB visit with provider  Face-to-face time at least 30 minutes. 50% or more of this visit was spent in counseling and coordination of care.  Debbe Odea Coralynn Gaona RN-C 12/18/2022 10:50 AM

## 2022-12-18 NOTE — Progress Notes (Signed)
[place] 9+5 wks,single IUP,FHR 171 bpm,CRL 29.38 mm,normal ovaries

## 2022-12-24 ENCOUNTER — Other Ambulatory Visit: Payer: Self-pay | Admitting: Advanced Practice Midwife

## 2022-12-24 MED ORDER — PROMETHAZINE HCL 12.5 MG PO TABS: 12.5000 mg | ORAL_TABLET | Freq: Four times a day (QID) | ORAL | 0 refills | Status: DC | PRN

## 2022-12-24 NOTE — Telephone Encounter (Signed)
From: [name] To: Office of Cyril Mourning, [place] Sent: 12/22/2022 11:49 AM EST Subject: Medication Renewal Request  Refills have been requested for the following medications:   promethazine (PHENERGAN) 12.5 MG tablet [Jennifer Griffin]  Patient Comment: Can my prescription be sent to [facility] [address] [place]  Preferred pharmacy: [facility] - [place], [place] - [address] Delivery method: [employer]

## 2022-12-28 NOTE — L&D Delivery Note (Signed)
OB/GYN Faculty Practice Delivery Note  Rebekah [name] is a 32 y.o. G3P3003 s/p SVD at [redacted]w[redacted]d. She was admitted for IOL for FGR.   ROM: 5h 64m with clear fluid GBS Status:  Negative/-- (06/18 1400) Maximum Maternal Temperature:  Temp (24hrs), Avg:98 F (36.7 C), Min:97.7 F (36.5 C), Max:98.4 F (36.9 C)    Labor Progress: Patient arrived at 3 cm dilation and was induced with pitocin and AROM.   Delivery Date/Time: 06/27/2023 at 1257 Delivery: Called to room and patient was complete and pushing. Head delivered in LOA position. No nuchal cord present. Shoulder and body delivered in usual fashion. Infant with spontaneous cry, placed on mother's abdomen, dried and stimulated. Cord clamped x 2 after 1-minute delay, and cut by FOB. Cord blood drawn.  Dense attachment of placenta.  Placenta remained in for close to 30 minutes.  Attending called to the room.  Placenta manually extracted but retained attached placenta.  Bleeding approximately 600 mL.  Attending elected to take patient to the OR for D&C.  See operative note for further details.  Labia, perineum, vagina, and cervix inspected with first-degree laceration which was hemostatic and not repaired.   Placenta: Retained, not intact, three-vessel cord Complications: Retained placenta with manual extraction requiring D&C for removal of pieces Lacerations: First-degree perineal not repaired EBL: Approximately 2700 mL after procedure Analgesia: Epidural in place  Infant: APGAR (1 MIN): 9   APGAR (5 MINS): 9   APGAR (10 MINS):    Weight: 2140 g  Derrel Nip, MD  OB Fellow  06/27/2023 3:59 PM

## 2023-01-13 ENCOUNTER — Other Ambulatory Visit: Payer: Self-pay | Admitting: Obstetrics & Gynecology

## 2023-01-13 DIAGNOSIS — Z3682 Encounter for antenatal screening for nuchal translucency: Secondary | ICD-10-CM

## 2023-01-14 ENCOUNTER — Ambulatory Visit (INDEPENDENT_AMBULATORY_CARE_PROVIDER_SITE_OTHER): Payer: Medicaid Other

## 2023-01-14 ENCOUNTER — Encounter: Payer: Self-pay | Admitting: Advanced Practice Midwife

## 2023-01-14 ENCOUNTER — Ambulatory Visit (INDEPENDENT_AMBULATORY_CARE_PROVIDER_SITE_OTHER): Payer: Medicaid Other | Admitting: Advanced Practice Midwife

## 2023-01-14 VITALS — BP 104/67 | HR 91 | Wt 112.0 lb

## 2023-01-14 DIAGNOSIS — Z3A13 13 weeks gestation of pregnancy: Secondary | ICD-10-CM

## 2023-01-14 DIAGNOSIS — Z113 Encounter for screening for infections with a predominantly sexual mode of transmission: Secondary | ICD-10-CM

## 2023-01-14 DIAGNOSIS — O09299 Supervision of pregnancy with other poor reproductive or obstetric history, unspecified trimester: Secondary | ICD-10-CM

## 2023-01-14 DIAGNOSIS — Z3682 Encounter for antenatal screening for nuchal translucency: Secondary | ICD-10-CM

## 2023-01-14 DIAGNOSIS — Z3480 Encounter for supervision of other normal pregnancy, unspecified trimester: Secondary | ICD-10-CM

## 2023-01-14 DIAGNOSIS — Z1332 Encounter for screening for maternal depression: Secondary | ICD-10-CM

## 2023-01-14 DIAGNOSIS — Z1379 Encounter for other screening for genetic and chromosomal anomalies: Secondary | ICD-10-CM

## 2023-01-14 DIAGNOSIS — F1990 Other psychoactive substance use, unspecified, uncomplicated: Secondary | ICD-10-CM | POA: Insufficient documentation

## 2023-01-14 DIAGNOSIS — O09291 Supervision of pregnancy with other poor reproductive or obstetric history, first trimester: Secondary | ICD-10-CM

## 2023-01-14 DIAGNOSIS — Z363 Encounter for antenatal screening for malformations: Secondary | ICD-10-CM

## 2023-01-14 HISTORY — DX: Supervision of pregnancy with other poor reproductive or obstetric history, unspecified trimester: O09.299

## 2023-01-14 NOTE — Patient Instructions (Signed)
Rebekah [name], I greatly value your feedback.  If you receive a survey following your visit with [place] today, we appreciate you taking the time to fill it out.  Thanks, Nigel Berthold, DNP, [place]!!! It is now [place] at [facility] ([place], [place] [address]) Entrance located off of [address]   Nausea & Vomiting Have saltine crackers or pretzels by your bed and eat a few bites before you raise your head out of bed in the morning Eat small frequent meals throughout the day instead of large meals Drink plenty of fluids throughout the day to stay hydrated, just don't drink a lot of fluids with your meals.  This can make your stomach fill up faster making you feel sick Do not brush your teeth right after you eat Products with real ginger are good for nausea, like ginger ale and ginger hard candy Make sure it says made with real ginger! Sucking on sour candy like lemon heads is also good for nausea If your prenatal vitamins make you nauseated, take them at night so you will sleep through the nausea Sea Bands If you feel like you need medicine for the nausea & vomiting please let us know If you are unable to keep any fluids or food down please let us know   Constipation Drink plenty of fluid, preferably water, throughout the day Eat foods high in fiber such as fruits, vegetables, and grains Exercise, such as walking, is a good way to keep your bowels regular Drink warm fluids, especially warm prune juice, or decaf coffee Eat a 1/2 cup of real oatmeal (not instant), 1/2 cup applesauce, and 1/2-1 cup warm prune juice every day If needed, you may take Colace (docusate sodium) stool softener once or twice a day to help keep the stool soft.  If you still are having problems with constipation, you may take Miralax once daily as needed to help keep your bowels regular.   Home Blood Pressure Monitoring for  Patients   Your provider has recommended that you check your blood pressure (BP) at least once a week at home. If you do not have a blood pressure cuff at home, one will be provided for you. Contact your provider if you have not received your monitor within 1 week.   Helpful Tips for Accurate Home Blood Pressure Checks  Don't smoke, exercise, or drink caffeine 30 minutes before checking your BP Use the restroom before checking your BP (a full bladder can raise your pressure) Relax in a comfortable upright chair Feet on the ground Left arm resting comfortably on a flat surface at the level of your heart Legs uncrossed Back supported Sit quietly and don't talk Place the cuff on your bare arm Adjust snuggly, so that only two fingertips can fit between your skin and the top of the cuff Check 2 readings separated by at least one minute Keep a log of your BP readings For a visual, please reference this diagram: http://ccnc.care/bpdiagram  Provider Name: [facility]     Phone: [phone]  Zone 1: ALL CLEAR  Continue to monitor your symptoms:  BP reading is less than 140 (top number) or less than 90 (bottom number)  No right upper stomach pain No headaches or seeing spots No feeling nauseated or throwing up No swelling in face and hands  Zone 2: CAUTION Call your doctor's office for any of the following:  BP reading is greater than 140 (top number) or greater than 90 (bottom number)  Stomach pain under your ribs in the middle or right side Headaches or seeing spots Feeling nauseated or throwing up Swelling in face and hands  Zone 3: EMERGENCY  Seek immediate medical care if you have any of the following:  BP reading is greater than160 (top number) or greater than 110 (bottom number) Severe headaches not improving with Tylenol Serious difficulty catching your breath Any worsening symptoms from Zone 2    First Trimester of Pregnancy The first trimester of pregnancy is from  week 1 until the end of week 12 (months 1 through 3). A week after a sperm fertilizes an egg, the egg will implant on the wall of the uterus. This embryo will begin to develop into a baby. Genes from you and your partner are forming the baby. The female genes determine whether the baby is a boy or a girl. At 6-8 weeks, the eyes and face are formed, and the heartbeat can be seen on ultrasound. At the end of 12 weeks, all the baby's organs are formed.  Now that you are pregnant, you will want to do everything you can to have a healthy baby. Two of the most important things are to get good prenatal care and to follow your health care provider's instructions. Prenatal care is all the medical care you receive before the baby's birth. This care will help prevent, find, and treat any problems during the pregnancy and childbirth. BODY CHANGES Your body goes through many changes during pregnancy. The changes vary from woman to woman.  You may gain or lose a couple of pounds at first. You may feel sick to your stomach (nauseous) and throw up (vomit). If the vomiting is uncontrollable, call your health care provider. You may tire easily. You may develop headaches that can be relieved by medicines approved by your health care provider. You may urinate more often. Painful urination may mean you have a bladder infection. You may develop heartburn as a result of your pregnancy. You may develop constipation because certain hormones are causing the muscles that push waste through your intestines to slow down. You may develop hemorrhoids or swollen, bulging veins (varicose veins). Your breasts may begin to grow larger and become tender. Your nipples may stick out more, and the tissue that surrounds them (areola) may become darker. Your gums may bleed and may be sensitive to brushing and flossing. Dark spots or blotches (chloasma, mask of pregnancy) may develop on your face. This will likely fade after the baby is  born. Your menstrual periods will stop. You may have a loss of appetite. You may develop cravings for certain kinds of food. You may have changes in your emotions from day to day, such as being excited to be pregnant or being concerned that something may go wrong with the pregnancy and baby. You may have more vivid and strange dreams. You may have changes in your hair. These can include thickening of your hair, rapid growth, and changes in texture. Some women also have hair loss during or after pregnancy, or hair that feels dry or thin. Your hair will most likely return to normal after your baby is born. WHAT TO EXPECT AT YOUR PRENATAL VISITS During a routine prenatal visit: You will be weighed to make sure you and the baby are growing normally. Your blood pressure will be taken. Your abdomen will be measured to track your baby's growth. The fetal  heartbeat will be listened to starting around week 10 or 12 of your pregnancy. Test results from any previous visits will be discussed. Your health care provider may ask you: How you are feeling. If you are feeling the baby move. If you have had any abnormal symptoms, such as leaking fluid, bleeding, severe headaches, or abdominal cramping. If you have any questions. Other tests that may be performed during your first trimester include: Blood tests to find your blood type and to check for the presence of any previous infections. They will also be used to check for low iron levels (anemia) and Rh antibodies. Later in the pregnancy, blood tests for diabetes will be done along with other tests if problems develop. Urine tests to check for infections, diabetes, or protein in the urine. An ultrasound to confirm the proper growth and development of the baby. An amniocentesis to check for possible genetic problems. Fetal screens for spina bifida and Down syndrome. You may need other tests to make sure you and the baby are doing well. HOME CARE  INSTRUCTIONS  Medicines Follow your health care provider's instructions regarding medicine use. Specific medicines may be either safe or unsafe to take during pregnancy. Take your prenatal vitamins as directed. If you develop constipation, try taking a stool softener if your health care provider approves. Diet Eat regular, well-balanced meals. Choose a variety of foods, such as meat or vegetable-based protein, fish, milk and low-fat dairy products, vegetables, fruits, and whole grain breads and cereals. Your health care provider will help you determine the amount of weight gain that is right for you. Avoid raw meat and uncooked cheese. These carry germs that can cause birth defects in the baby. Eating four or five small meals rather than three large meals a day may help relieve nausea and vomiting. If you start to feel nauseous, eating a few soda crackers can be helpful. Drinking liquids between meals instead of during meals also seems to help nausea and vomiting. If you develop constipation, eat more high-fiber foods, such as fresh vegetables or fruit and whole grains. Drink enough fluids to keep your urine clear or pale yellow. Activity and Exercise Exercise only as directed by your health care provider. Exercising will help you: Control your weight. Stay in shape. Be prepared for labor and delivery. Experiencing pain or cramping in the lower abdomen or low back is a good sign that you should stop exercising. Check with your health care provider before continuing normal exercises. Try to avoid standing for long periods of time. Move your legs often if you must stand in one place for a long time. Avoid heavy lifting. Wear low-heeled shoes, and practice good posture. You may continue to have sex unless your health care provider directs you otherwise. Relief of Pain or Discomfort Wear a good support bra for breast tenderness.   Take warm sitz baths to soothe any pain or discomfort caused by  hemorrhoids. Use hemorrhoid cream if your health care provider approves.   Rest with your legs elevated if you have leg cramps or low back pain. If you develop varicose veins in your legs, wear support hose. Elevate your feet for 15 minutes, 3-4 times a day. Limit salt in your diet. Prenatal Care Schedule your prenatal visits by the twelfth week of pregnancy. They are usually scheduled monthly at first, then more often in the last 2 months before delivery. Write down your questions. Take them to your prenatal visits. Keep all your prenatal visits as directed  by your health care provider. Safety Wear your seat belt at all times when driving. Make a list of emergency phone numbers, including numbers for family, friends, the hospital, and police and fire departments. General Tips Ask your health care provider for a referral to a local prenatal education class. Begin classes no later than at the beginning of month 6 of your pregnancy. Ask for help if you have counseling or nutritional needs during pregnancy. Your health care provider can offer advice or refer you to specialists for help with various needs. Do not use hot tubs, steam rooms, or saunas. Do not douche or use tampons or scented sanitary pads. Do not cross your legs for long periods of time. Avoid cat litter boxes and soil used by cats. These carry germs that can cause birth defects in the baby and possibly loss of the fetus by miscarriage or stillbirth. Avoid all smoking, herbs, alcohol, and medicines not prescribed by your health care provider. Chemicals in these affect the formation and growth of the baby. Schedule a dentist appointment. At home, brush your teeth with a soft toothbrush and be gentle when you floss. SEEK MEDICAL CARE IF:  You have dizziness. You have mild pelvic cramps, pelvic pressure, or nagging pain in the abdominal area. You have persistent nausea, vomiting, or diarrhea. You have a bad smelling vaginal  discharge. You have pain with urination. You notice increased swelling in your face, hands, legs, or ankles. SEEK IMMEDIATE MEDICAL CARE IF:  You have a fever. You are leaking fluid from your vagina. You have spotting or bleeding from your vagina. You have severe abdominal cramping or pain. You have rapid weight gain or loss. You vomit blood or material that looks like coffee grounds. You are exposed to [place] measles and have never had them. You are exposed to fifth disease or chickenpox. You develop a severe headache. You have shortness of breath. You have any kind of trauma, such as from a fall or a car accident. Document Released: 12/08/2001 Document Revised: 04/30/2014 Document Reviewed: 10/24/2013 [facility] Patient Information 2015 [place], [place]. This information is not intended to replace advice given to you by your health care provider. Make sure you discuss any questions you have with your health care provider.  Coronavirus (COVID-19) Are you at risk?  Are you at risk for the Coronavirus (COVID-19)?  To be considered HIGH RISK for Coronavirus (COVID-19), you have to meet the following criteria:  Traveled to [place], [place], [place], [place] or [place];  and have fever, cough, and shortness of breath within the last 2 weeks of travel OR Been in close contact with a person diagnosed with COVID-19 within the last 2 weeks and have fever, cough, and shortness of breath IF YOU DO NOT MEET THESE CRITERIA, YOU ARE CONSIDERED LOW RISK FOR COVID-19.  What to do if you are HIGH RISK for COVID-19?  If you are having a medical emergency, call 911. Seek medical care right away. Before you go to a doctor's office, urgent care or emergency department, call ahead and tell them about your recent travel, contact with someone diagnosed with COVID-19, and your symptoms. You should receive instructions from your physician's office regarding next steps of care.  When you arrive at healthcare provider,  tell the healthcare staff immediately you have returned from visiting [place], [place], [place], [place] or [place]; in the last two weeks or you have been in close contact with a person diagnosed with COVID-19 in the last 2 weeks.  Tell the health care staff about your symptoms: fever, cough and shortness of breath. After you have been seen by a medical provider, you will be either: Tested for (COVID-19) and discharged home on quarantine except to seek medical care if symptoms worsen, and asked to  Stay home and avoid contact with others until you get your results (4-5 days)  Avoid travel on public transportation if possible (such as bus, train, or airplane) or Sent to the Emergency Department by EMS for evaluation, COVID-19 testing, and possible admission depending on your condition and test results.  What to do if you are LOW RISK for COVID-19?  Reduce your risk of any infection by using the same precautions used for avoiding the common cold or flu:  Wash your hands often with soap and warm water for at least 20 seconds.  If soap and water are not readily available, use an alcohol-based hand sanitizer with at least 60% alcohol.  If coughing or sneezing, cover your mouth and nose by coughing or sneezing into the elbow areas of your shirt or coat, into a tissue or into your sleeve (not your hands). Avoid shaking hands with others and consider head nods or verbal greetings only. Avoid touching your eyes, nose, or mouth with unwashed hands.  Avoid close contact with people who are sick. Avoid places or events with large numbers of people in one location, like concerts or sporting events. Carefully consider travel plans you have or are making. If you are planning any travel outside or inside the [place], visit the CDC's Travelers' Health webpage for the latest health notices. If you have some symptoms but not all symptoms, continue to monitor at home and seek medical attention if your symptoms worsen. If  you are having a medical emergency, call 911.   ADDITIONAL HEALTHCARE OPTIONS FOR [place] / e-Visit: [email or web address]         [facility]: [facility]: [id number]                   [facility]: [phone]     Safe Medications in Pregnancy   Acne: Benzoyl Peroxide Salicylic Acid  Backache/Headache: Tylenol: 2 regular strength every 4 hours OR              2 Extra strength every 6 hours  Colds/Coughs/Allergies: Benadryl (alcohol free) 25 mg every 6 hours as needed Breath right strips Claritin Cepacol throat lozenges Chloraseptic throat spray Cold-Eeze- up to three times per day Cough drops, alcohol free Flonase (by prescription only) Guaifenesin Mucinex Robitussin DM (plain only, alcohol free) Saline nasal spray/drops Sudafed (pseudoephedrine) & Actifed ** use only after [redacted] weeks gestation and if you do not have high blood pressure Tylenol Vicks Vaporub Zinc lozenges Zyrtec   Constipation: Colace Ducolax suppositories Fleet enema Glycerin suppositories Metamucil Milk of magnesia Miralax Senokot Smooth move tea  Diarrhea: Kaopectate Imodium A-D  *NO pepto Bismol  Hemorrhoids: Anusol Anusol HC Preparation H Tucks  Indigestion: Tums Maalox Mylanta Zantac  Pepcid  Insomnia: Benadryl (alcohol free) 36m every 6 hours as needed Tylenol PM Unisom, no Gelcaps  Leg Cramps: Tums MagGel  Nausea/Vomiting:  Bonine Dramamine Emetrol Ginger extract Sea bands Meclizine  Nausea medication to take during pregnancy:  Unisom (doxylamine succinate 25 mg tablets) Take one tablet daily at bedtime. If symptoms are not adequately controlled, the dose can be increased to a maximum recommended dose of two tablets daily (1/2 tablet in  the morning, 1/2 tablet mid-afternoon and one at bedtime). Vitamin B6 160m tablets. Take one  tablet twice a day (up to 200 mg per day).  Skin Rashes: Aveeno products Benadryl cream or 250mevery 6 hours as needed Calamine Lotion 1% cortisone cream  Yeast infection: Gyne-lotrimin 7 Monistat 7   **If taking multiple medications, please check labels to avoid duplicating the same active ingredients **take medication as directed on the label ** Do not exceed 4000 mg of tylenol in 24 hours **Do not take medications that contain aspirin or ibuprofen

## 2023-01-14 NOTE — Progress Notes (Signed)
[place] 13+4 wks,measurements c/w dates,FHR 150 bpm,posterior placenta,normal ovaries,NB present,thickened nuchal fold,NT 3.1 mm,CRL 71.01 mm

## 2023-01-14 NOTE — Progress Notes (Signed)
INITIAL OBSTETRICAL VISIT Patient name: Rebekah [name] MRN [id number]  Date of birth: 1991-01-03 Chief Complaint:   Initial Prenatal Visit  History of Present Illness:   Rebekah [name] is a 32 y.o. G56P2002 Caucasian female at [redacted]w[redacted]d by [place] at 9 weeks with an Estimated Date of Delivery: 07/18/23 being seen today for her initial obstetrical visit.   Her obstetrical history is significant for term SVD X2 w/o problems, On methadone, stable.  Had to have surgical removal of placenta right after birth of last child d/t retention. Delivered in [place], [place].  Today she reports no complaints.     01/14/2023    8:10 AM  Depression screen PHQ 2/9  Decreased Interest 2  Down, Depressed, Hopeless 2  PHQ - 2 Score 4  Altered sleeping 2  Tired, decreased energy 2  Change in appetite 1  Feeling bad or failure about yourself  2  Trouble concentrating 2  Moving slowly or fidgety/restless 1  Suicidal thoughts 0  PHQ-9 Score 14    Patient's last menstrual period was 10/22/2022. Last pap 10/23(bx was CIN 1/2.  Planned conservative mgt with plan to repepat colpo 4/25.) Review of Systems:   Pertinent items are noted in HPI Denies cramping/contractions, leakage of fluid, vaginal bleeding, abnormal vaginal discharge w/ itching/odor/irritation, headaches, visual changes, shortness of breath, chest pain, abdominal pain, severe nausea/vomiting, or problems with urination or bowel movements unless otherwise stated above.  Pertinent History Reviewed:  Reviewed past medical,surgical, social, obstetrical and family history.  Reviewed problem list, medications and allergies. OB History  Gravida Para Term Preterm AB Living  3 2 2     2   SAB IAB Ectopic Multiple Live Births          2    # Outcome Date GA Lbr Len/2nd Weight Sex Delivery Anes PTL Lv  3 Current           2 Term 11/08/15 [redacted]w[redacted]d  5 lb 3 oz (2.353 kg) F Vag-Spont None N LIV     Birth Comments: Removal of retained placenta in OR  1 Term 11/20/10  [redacted]w[redacted]d  6 lb 2 oz (2.778 kg) F Vag-Spont EPI N LIV   Physical Assessment:   Vitals:   01/14/23 0915  BP: 104/67  Pulse: 91  Weight: 112 lb (50.8 kg)  Body mass index is 20.49 kg/m.       Physical Examination:  General appearance - well appearing, and in no distress  Mental status - alert, oriented to person, place, and time  Psych:  She has a normal mood and affect  Skin - warm and dry, normal color, no suspicious lesions noted  Chest - effort normal  Heart - normal rate and regular rhythm  Abdomen - soft, nontender  Extremities:  No swelling or varicosities noted   TODAY'S NT   [place] 13+4 wks,measurements c/w dates,FHR 150 bpm,posterior placenta,normal ovaries,NB present,thickened nuchal fold,NT 3.1 mm,CRL 71.01 mm      No results found for this or any previous visit (from the past 24 hour(s)).   Indications for ASA therapy (per uptodate) One of the following: Previous pregnancy with preeclampsia, especially early onset and with an adverse outcome No Multifetal gestation No Chronic hypertension No Type 1 or 2 diabetes mellitus No Chronic kidney disease No Autoimmune disease (antiphospholipid syndrome, systemic lupus erythematosus) No  Two or more of the following: Nulliparity No Obesity (body mass index >30 kg/m2) No Family history of preeclampsia in mother or sister No Age ?[place]  years No Sociodemographic characteristics (African American race, low socioeconomic level) No Personal risk factors (eg, previous pregnancy with low birth weight or small for gestational age infant, previous adverse pregnancy outcome [eg, stillbirth], interval >10 years between pregnancies) No  Indications for early GDM screening  First-degree relative with diabetes No BMI >30kg/m2 No Age > 35 No Previous birth of an infant weighing ?4000 g No Gestational diabetes mellitus in a previous pregnancy No Glycated hemoglobin ?5.7 percent (39 mmol/mol), impaired glucose tolerance, or impaired fasting  glucose on previous testing No High-risk race/ethnicity (eg, African American, Latino, Native American, Asian American, Pacific Islander) No Previous stillbirth of unknown cause No Maternal birthweight > 9 lbs No History of cardiovascular disease No Hypertension or on therapy for hypertension No High-density lipoprotein cholesterol level <35 mg/dL (0.90 mmol/L) and/or a triglyceride level >250 mg/dL (2.82 mmol/L) No Polycystic ovary syndrome No Physical inactivity No Other clinical condition associated with insulin resistance (eg, severe obesity, acanthosis nigricans) No Current use of glucocorticoids No     01/14/2023    8:10 AM  Depression screen PHQ 2/9  Decreased Interest 2  Down, Depressed, Hopeless 2  PHQ - 2 Score 4  Altered sleeping 2  Tired, decreased energy 2  Change in appetite 1  Feeling bad or failure about yourself  2  Trouble concentrating 2  Moving slowly or fidgety/restless 1  Suicidal thoughts 0  PHQ-9 Score 14        01/14/2023    8:10 AM  GAD 7 : Generalized Anxiety Score  Nervous, Anxious, on Edge 2  Control/stop worrying 2  Worry too much - different things 2  Trouble relaxing 2  Restless 1  Easily annoyed or irritable 3  Afraid - awful might happen 1  Total GAD 7 Score 13      Assessment & Plan:  1) High-Risk Pregnancy G3P2002 at [redacted]w[redacted]d with an Estimated Date of Delivery: 07/18/23   2) Initial OB visit  3) Opioid use disorder:  on Methadone 90mg /day.  Has weekly MD visits and monthly therapy  Meds: No orders of the defined types were placed in this encounter.   Initial labs obtained Continue prenatal vitamins Reviewed n/v relief measures and warning s/s to report Reviewed recommended weight gain based on pre-gravid BMI Encouraged well-balanced diet Genetic & carrier screening discussed: requests Panorama, NT/IT, and Horizon , declines AFP Ultrasound discussed; fetal survey: requested CCNC completed> form faxed if has or is planning to  apply for medicaid The nature of [place] for [employer] with multiple MDs and other Advanced Practice Providers was explained to patient; also emphasized that fellows, residents, and students are part of our team. Has home bp cuff. Check bp weekly, let us know if >140/90.        Joaquim Lai Cresenzo-Dishmon 1:48 PM

## 2023-01-15 LAB — INTEGRATED 1

## 2023-01-16 LAB — CBC/D/PLT+RPR+RH+ABO+RUBIGG...
Antibody Screen: NEGATIVE
Basophils Absolute: 0 10*3/uL (ref 0.0–0.2)
Basos: 0 %
EOS (ABSOLUTE): 0.2 10*3/uL (ref 0.0–0.4)
Eos: 3 %
HCV Ab: NONREACTIVE
HIV Screen 4th Generation wRfx: NONREACTIVE
Hematocrit: 34.3 % (ref 34.0–46.6)
Hemoglobin: 11.3 g/dL (ref 11.1–15.9)
Hepatitis B Surface Ag: NEGATIVE
Immature Grans (Abs): 0 10*3/uL (ref 0.0–0.1)
Immature Granulocytes: 0 %
Lymphocytes Absolute: 1.5 10*3/uL (ref 0.7–3.1)
Lymphs: 22 %
MCH: 30.5 pg (ref 26.6–33.0)
MCHC: 32.9 g/dL (ref 31.5–35.7)
MCV: 93 fL (ref 79–97)
Monocytes Absolute: 0.3 10*3/uL (ref 0.1–0.9)
Monocytes: 5 %
Neutrophils Absolute: 4.8 10*3/uL (ref 1.4–7.0)
Neutrophils: 70 %
Platelets: 304 10*3/uL (ref 150–450)
RBC: 3.71 x10E6/uL — ABNORMAL LOW (ref 3.77–5.28)
RDW: 12.6 % (ref 11.7–15.4)
RPR Ser Ql: NONREACTIVE
Rh Factor: POSITIVE
Rubella Antibodies, IGG: 1.76 index (ref >0.99)
WBC: 6.9 10*3/uL (ref 3.4–10.8)

## 2023-01-16 LAB — URINE CULTURE

## 2023-01-16 LAB — INTEGRATED 1
Crown Rump Length: 71 mm
Gest. Age on Collection Date: 13.1 weeks
Maternal Age at EDD: 33.7 yr
Nuchal Translucency (NT): 3.1 mm
Number of Fetuses: 1
PAPP-A Value: 2739.9 ng/mL
Weight: 112 [lb_av]

## 2023-01-16 LAB — HCV INTERPRETATION

## 2023-01-17 LAB — GC/CHLAMYDIA PROBE AMP
Chlamydia trachomatis, NAA: NEGATIVE
Neisseria Gonorrhoeae by PCR: NEGATIVE

## 2023-01-20 ENCOUNTER — Encounter: Payer: Self-pay | Admitting: Advanced Practice Midwife

## 2023-01-21 LAB — PANORAMA PRENATAL TEST FULL PANEL:PANORAMA TEST PLUS 5 ADDITIONAL MICRODELETIONS: FETAL FRACTION: 13.5

## 2023-01-25 ENCOUNTER — Other Ambulatory Visit: Payer: Self-pay | Admitting: Women's Health

## 2023-01-25 MED ORDER — PROMETHAZINE HCL 12.5 MG PO TABS: 12.5000 mg | ORAL_TABLET | Freq: Four times a day (QID) | ORAL | 6 refills | Status: DC | PRN

## 2023-01-26 LAB — HORIZON CUSTOM: REPORT SUMMARY: NEGATIVE

## 2023-02-01 ENCOUNTER — Encounter: Payer: Self-pay | Admitting: Advanced Practice Midwife

## 2023-02-23 ENCOUNTER — Ambulatory Visit (INDEPENDENT_AMBULATORY_CARE_PROVIDER_SITE_OTHER): Payer: Medicaid Other

## 2023-02-23 ENCOUNTER — Encounter: Payer: Self-pay | Admitting: Women's Health

## 2023-02-23 ENCOUNTER — Ambulatory Visit (INDEPENDENT_AMBULATORY_CARE_PROVIDER_SITE_OTHER): Payer: Medicaid Other | Admitting: Women's Health

## 2023-02-23 VITALS — BP 91/59 | HR 108 | Wt 118.4 lb

## 2023-02-23 DIAGNOSIS — Z3480 Encounter for supervision of other normal pregnancy, unspecified trimester: Secondary | ICD-10-CM

## 2023-02-23 DIAGNOSIS — Z3A19 19 weeks gestation of pregnancy: Secondary | ICD-10-CM | POA: Diagnosis not present

## 2023-02-23 DIAGNOSIS — O09299 Supervision of pregnancy with other poor reproductive or obstetric history, unspecified trimester: Secondary | ICD-10-CM

## 2023-02-23 DIAGNOSIS — F1990 Other psychoactive substance use, unspecified, uncomplicated: Secondary | ICD-10-CM

## 2023-02-23 DIAGNOSIS — Z363 Encounter for antenatal screening for malformations: Secondary | ICD-10-CM

## 2023-02-23 DIAGNOSIS — Z1379 Encounter for other screening for genetic and chromosomal anomalies: Secondary | ICD-10-CM

## 2023-02-23 DIAGNOSIS — Z3482 Encounter for supervision of other normal pregnancy, second trimester: Secondary | ICD-10-CM

## 2023-02-23 DIAGNOSIS — O359XX0 Maternal care for (suspected) fetal abnormality and damage, unspecified, not applicable or unspecified: Secondary | ICD-10-CM

## 2023-02-23 MED ORDER — OMEPRAZOLE MAGNESIUM 20 MG PO TBEC: 20.0000 mg | DELAYED_RELEASE_TABLET | Freq: Every day | ORAL | 4 refills | Status: DC

## 2023-02-23 NOTE — Progress Notes (Signed)
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

## 2023-02-23 NOTE — Patient Instructions (Signed)
Derra, thank you for choosing our office today! We appreciate the opportunity to meet your healthcare needs. You may receive a short survey by mail, e-mail, or through [employer]. If you are happy with your care we would appreciate if you could take just a few minutes to complete the survey questions. We read all of your comments and take your feedback very seriously. Thank you again for choosing our office.  [facility] for [employer] Team at [place] at [facility] ([place], [place] [address]) Entrance C, located off of [address] parking  Go to [email or web address] to register for FREE online childbirth classes  Call the office [phone]) or go to [facility] if: You begin to severe cramping Your water breaks.  Sometimes it is a big gush of fluid, sometimes it is just a trickle that keeps getting your panties wet or running down your legs You have vaginal bleeding.  It is normal to have a small amount of spotting if your cervix was checked.   [facility] Pediatricians/Family Doctors [facility]): [address] [place], [facility]: [address] [place], [phone]                [facility]): [place], [phone] (call to ask if accepting patients) [facility] Department: [address], [place], [place] Pediatricians/Family Doctors [facility]): [place]. [place], [address], [facility]: [address] [place], [place] [facility] [place]: [place], [place] [facility]): [phone] [facility]: [address] [place]: [address], [place] [facility]: [phone], [phone]  Home Blood Pressure Monitoring for Patients   Your provider has recommended that you check your blood pressure (BP) at least once a week at home. If you do not have a blood pressure cuff at home, one will be provided for you. Contact your provider if you have not received your monitor within 1 week.   Helpful Tips for Accurate Home Blood Pressure Checks  Don't smoke, exercise, or drink caffeine 30 minutes before checking your BP Use the restroom before checking your BP (a full bladder can raise your pressure) Relax in a comfortable upright chair Feet on the ground Left arm resting comfortably on a flat surface at the level of your heart Legs uncrossed Back supported Sit quietly and don't talk Place the cuff on your bare arm Adjust snuggly, so that only two fingertips can fit between your skin and the top of the cuff Check 2 readings separated by at least one minute Keep a log of your BP readings For a visual, please reference this diagram: http://ccnc.care/bpdiagram  Provider Name: [facility]     Phone: [phone]  Zone 1: ALL CLEAR  Continue to monitor your symptoms:  BP reading is less than 140 (top number) or less than 90 (bottom number)  No right upper stomach pain No headaches or seeing spots No feeling nauseated or throwing up No swelling in face and hands  Zone 2: CAUTION Call your doctor's office for any of the following:  BP reading is greater than 140 (top number) or greater than  90 (bottom number)  Stomach pain under your ribs in the middle or right side Headaches or seeing spots Feeling nauseated or throwing up Swelling in face and hands  Zone 3: EMERGENCY  Seek immediate medical care if you have any of the following:  BP reading is greater than160 (top number) or greater than 110 (bottom number) Severe headaches not improving with Tylenol Serious difficulty catching your breath Any worsening symptoms from  Zone 2     Second Trimester of Pregnancy The second trimester is from week 14 through week 27 (months 4 through 6). The second trimester is often a time when you feel your best. Your body has adjusted to being pregnant, and you begin to feel better physically. Usually, morning sickness has lessened or quit completely, you may have more energy, and you may have an increase in appetite. The second trimester is also a time when the fetus is growing rapidly. At the end of the sixth month, the fetus is about 9 inches long and weighs about 1 pounds. You will likely begin to feel the baby move (quickening) between 16 and 20 weeks of pregnancy. Body changes during your second trimester Your body continues to go through many changes during your second trimester. The changes vary from woman to woman. Your weight will continue to increase. You will notice your lower abdomen bulging out. You may begin to get stretch marks on your hips, abdomen, and breasts. You may develop headaches that can be relieved by medicines. The medicines should be approved by your health care provider. You may urinate more often because the fetus is pressing on your bladder. You may develop or continue to have heartburn as a result of your pregnancy. You may develop constipation because certain hormones are causing the muscles that push waste through your intestines to slow down. You may develop hemorrhoids or swollen, bulging veins (varicose veins). You may have back pain. This is caused by: Weight gain. Pregnancy hormones that are relaxing the joints in your pelvis. A shift in weight and the muscles that support your balance. Your breasts will continue to grow and they will continue to become tender. Your gums may bleed and may be sensitive to brushing and flossing. Dark spots or blotches (chloasma, mask of pregnancy) may develop on your face. This will likely fade after the baby is born. A dark line from your belly button to  the pubic area (linea nigra) may appear. This will likely fade after the baby is born. You may have changes in your hair. These can include thickening of your hair, rapid growth, and changes in texture. Some women also have hair loss during or after pregnancy, or hair that feels dry or thin. Your hair will most likely return to normal after your baby is born.  What to expect at prenatal visits During a routine prenatal visit: You will be weighed to make sure you and the fetus are growing normally. Your blood pressure will be taken. Your abdomen will be measured to track your baby's growth. The fetal heartbeat will be listened to. Any test results from the previous visit will be discussed.  Your health care provider may ask you: How you are feeling. If you are feeling the baby move. If you have had any abnormal symptoms, such as leaking fluid, bleeding, severe headaches, or abdominal cramping. If you are using any tobacco products, including cigarettes, chewing tobacco, and electronic cigarettes. If you have any questions.  Other tests that may be performed during  your second trimester include: Blood tests that check for: Low iron levels (anemia). High blood sugar that affects pregnant women (gestational diabetes) between 41 and 28 weeks. Rh antibodies. This is to check for a protein on red blood cells (Rh factor). Urine tests to check for infections, diabetes, or protein in the urine. An ultrasound to confirm the proper growth and development of the baby. An amniocentesis to check for possible genetic problems. Fetal screens for spina bifida and Down syndrome. HIV (human immunodeficiency virus) testing. Routine prenatal testing includes screening for HIV, unless you choose not to have this test.  Follow these instructions at home: Medicines Follow your health care provider's instructions regarding medicine use. Specific medicines may be either safe or unsafe to take during  pregnancy. Take a prenatal vitamin that contains at least 600 micrograms (mcg) of folic acid. If you develop constipation, try taking a stool softener if your health care provider approves. Eating and drinking Eat a balanced diet that includes fresh fruits and vegetables, whole grains, good sources of protein such as meat, eggs, or tofu, and low-fat dairy. Your health care provider will help you determine the amount of weight gain that is right for you. Avoid raw meat and uncooked cheese. These carry germs that can cause birth defects in the baby. If you have low calcium intake from food, talk to your health care provider about whether you should take a daily calcium supplement. Limit foods that are high in fat and processed sugars, such as fried and sweet foods. To prevent constipation: Drink enough fluid to keep your urine clear or pale yellow. Eat foods that are high in fiber, such as fresh fruits and vegetables, whole grains, and beans. Activity Exercise only as directed by your health care provider. Most women can continue their usual exercise routine during pregnancy. Try to exercise for 30 minutes at least 5 days a week. Stop exercising if you experience uterine contractions. Avoid heavy lifting, wear low heel shoes, and practice good posture. A sexual relationship may be continued unless your health care provider directs you otherwise. Relieving pain and discomfort Wear a good support bra to prevent discomfort from breast tenderness. Take warm sitz baths to soothe any pain or discomfort caused by hemorrhoids. Use hemorrhoid cream if your health care provider approves. Rest with your legs elevated if you have leg cramps or low back pain. If you develop varicose veins, wear support hose. Elevate your feet for 15 minutes, 3-4 times a day. Limit salt in your diet. Prenatal Care Write down your questions. Take them to your prenatal visits. Keep all your prenatal visits as told by your health  care provider. This is important. Safety Wear your seat belt at all times when driving. Make a list of emergency phone numbers, including numbers for family, friends, the hospital, and police and fire departments. General instructions Ask your health care provider for a referral to a local prenatal education class. Begin classes no later than the beginning of month 6 of your pregnancy. Ask for help if you have counseling or nutritional needs during pregnancy. Your health care provider can offer advice or refer you to specialists for help with various needs. Do not use hot tubs, steam rooms, or saunas. Do not douche or use tampons or scented sanitary pads. Do not cross your legs for long periods of time. Avoid cat litter boxes and soil used by cats. These carry germs that can cause birth defects in the baby and possibly loss of the  fetus by miscarriage or stillbirth. Avoid all smoking, herbs, alcohol, and unprescribed drugs. Chemicals in these products can affect the formation and growth of the baby. Do not use any products that contain nicotine or tobacco, such as cigarettes and e-cigarettes. If you need help quitting, ask your health care provider. Visit your dentist if you have not gone yet during your pregnancy. Use a soft toothbrush to brush your teeth and be gentle when you floss. Contact a health care provider if: You have dizziness. You have mild pelvic cramps, pelvic pressure, or nagging pain in the abdominal area. You have persistent nausea, vomiting, or diarrhea. You have a bad smelling vaginal discharge. You have pain when you urinate. Get help right away if: You have a fever. You are leaking fluid from your vagina. You have spotting or bleeding from your vagina. You have severe abdominal cramping or pain. You have rapid weight gain or weight loss. You have shortness of breath with chest pain. You notice sudden or extreme swelling of your face, hands, ankles, feet, or legs. You  have not felt your baby move in over an hour. You have severe headaches that do not go away when you take medicine. You have vision changes. Summary The second trimester is from week 14 through week 27 (months 4 through 6). It is also a time when the fetus is growing rapidly. Your body goes through many changes during pregnancy. The changes vary from woman to woman. Avoid all smoking, herbs, alcohol, and unprescribed drugs. These chemicals affect the formation and growth your baby. Do not use any tobacco products, such as cigarettes, chewing tobacco, and e-cigarettes. If you need help quitting, ask your health care provider. Contact your health care provider if you have any questions. Keep all prenatal visits as told by your health care provider. This is important. This information is not intended to replace advice given to you by your health care provider. Make sure you discuss any questions you have with your health care provider. Document Released: 12/08/2001 Document Revised: 05/21/2016 Document Reviewed: 02/14/2013 Elsevier Interactive Patient Education  2017 [employer].

## 2023-02-23 NOTE — Progress Notes (Signed)
LOW-RISK PREGNANCY VISIT Patient name: Rebekah [name] MRN [id number]  Date of birth: 03/22/1991 Chief Complaint:   Routine Prenatal Visit and Pregnancy Ultrasound (Need something heart burn/ 2 nd IT)  History of Present Illness:   RYLIN OHLMANN is a 32 y.o. G41P2002 female at 56w2dwith an Estimated Date of Delivery: 07/18/23 being seen today for ongoing management of a low-risk pregnancy.   Today she reports heartburn/reflux, TUMS helps, but prefers daily preventive. Contractions: Not present.  .  Movement: Present. denies leaking of fluid.     01/14/2023    8:10 AM  Depression screen PHQ 2/9  Decreased Interest 2  Down, Depressed, Hopeless 2  PHQ - 2 Score 4  Altered sleeping 2  Tired, decreased energy 2  Change in appetite 1  Feeling bad or failure about yourself  2  Trouble concentrating 2  Moving slowly or fidgety/restless 1  Suicidal thoughts 0  PHQ-9 Score 14        01/14/2023    8:10 AM  GAD 7 : Generalized Anxiety Score  Nervous, Anxious, on Edge 2  Control/stop worrying 2  Worry too much - different things 2  Trouble relaxing 2  Restless 1  Easily annoyed or irritable 3  Afraid - awful might happen 1  Total GAD 7 Score 13      Review of Systems:   Pertinent items are noted in HPI Denies abnormal vaginal discharge w/ itching/odor/irritation, headaches, visual changes, shortness of breath, chest pain, abdominal pain, severe nausea/vomiting, or problems with urination or bowel movements unless otherwise stated above. Pertinent History Reviewed:  Reviewed past medical,surgical, social, obstetrical and family history.  Reviewed problem list, medications and allergies. Physical Assessment:   Vitals:   02/23/23 1145  BP: (!) 91/59  Pulse: (!) 108  Weight: 118 lb 6.4 oz (53.7 kg)  Body mass index is 21.66 kg/m.        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 Discussed and MFM referral for detailed u/s ordered   Meds:  Meds ordered this encounter  Medications   omeprazole (PRILOSEC OTC) 20 MG tablet    Sig: Take 1 tablet (20 mg total) by mouth daily.    Dispense:  30 tablet    Refill:  4   Labs/procedures today: U/S, 2nd IT, and declined flu shot  Plan:  Continue routine obstetrical care  Next visit: prefers in person    Reviewed: Preterm labor symptoms and general obstetric precautions including but not  limited to vaginal bleeding, contractions, leaking of fluid and fetal movement were reviewed in detail with the patient.  All questions were answered. Does have home bp cuff. Office bp cuff given: not applicable. Check bp weekly, let us know if consistently >140 and/or >90.  Follow-up: Return in about 4 weeks (around 03/23/2023) for LROB, MD only, in person.  Future Appointments  Date Time Provider [place]  03/23/2023  9:50 AM Janyth Pupa, DO [facility]    Orders Placed This Encounter  Procedures   [place] MFM OB DETAIL +14 WK   INTEGRATED 2   [place],  [facility] 02/23/2023 12:11 PM

## 2023-02-25 LAB — INTEGRATED 2
AFP MoM: 0.91
Alpha-Fetoprotein: 49.9 ng/mL
Crown Rump Length: 71 mm
DIA MoM: 2.72
DIA Value: 528.1 pg/mL
Estriol, Unconjugated: 2.11 ng/mL
Gest. Age on Collection Date: 13.1 weeks
Gestational Age: 18.9 weeks
Maternal Age at EDD: 33.7 yr
Nuchal Translucency (NT): 3.1 mm
Nuchal Translucency MoM: 1.92
Number of Fetuses: 1
PAPP-A MoM: 1.52
PAPP-A Value: 2739.9 ng/mL
Test Results:: POSITIVE — AB
Weight: 112 [lb_av]
Weight: 112 [lb_av]
hCG MoM: 2.58
hCG Value: 73 IU/mL
uE3 MoM: 1.15

## 2023-03-02 ENCOUNTER — Encounter: Payer: Self-pay | Admitting: Women's Health

## 2023-03-23 ENCOUNTER — Ambulatory Visit (INDEPENDENT_AMBULATORY_CARE_PROVIDER_SITE_OTHER): Payer: Medicaid Other | Admitting: Obstetrics & Gynecology

## 2023-03-23 ENCOUNTER — Encounter: Payer: Self-pay | Admitting: Obstetrics & Gynecology

## 2023-03-23 VITALS — BP 117/78 | HR 124 | Wt 126.0 lb

## 2023-03-23 DIAGNOSIS — Z720 Tobacco use: Secondary | ICD-10-CM

## 2023-03-23 DIAGNOSIS — Z3A23 23 weeks gestation of pregnancy: Secondary | ICD-10-CM

## 2023-03-23 DIAGNOSIS — O0993 Supervision of high risk pregnancy, unspecified, third trimester: Secondary | ICD-10-CM

## 2023-03-23 DIAGNOSIS — F1990 Other psychoactive substance use, unspecified, uncomplicated: Secondary | ICD-10-CM

## 2023-03-23 NOTE — Progress Notes (Signed)
HIGH-RISK PREGNANCY VISIT Patient name: Rebekah [name] MRN [id number]  Date of birth: [birth year] Chief Complaint:   Routine Prenatal Visit  History of Present Illness:   Rebekah [name] is a 32 y.o. G45P2002 female at [redacted]w[redacted]d with an Estimated Date of Delivery: 07/18/23 being seen today for ongoing management of a high-risk pregnancy complicated by:  -OUD -tobacco use -club foot.    Today she reports no complaints.   Contractions: Not present. Vag. Bleeding: None.  Movement: Present. denies leaking of fluid.      01/14/2023    8:10 AM  Depression screen PHQ 2/9  Decreased Interest 2  Down, Depressed, Hopeless 2  PHQ - 2 Score 4  Altered sleeping 2  Tired, decreased energy 2  Change in appetite 1  Feeling bad or failure about yourself  2  Trouble concentrating 2  Moving slowly or fidgety/restless 1  Suicidal thoughts 0  PHQ-9 Score 14     Current Outpatient Medications  Medication Instructions   methadone (DOLOPHINE) 90 mg, Oral, Daily   omeprazole (PRILOSEC OTC) 20 mg, Oral, Daily   Prenatal Vit-Fe Fumarate-FA (PRENATAL VITAMIN PO) Oral   promethazine (PHENERGAN) 12.5 mg, Oral, Every 6 hours PRN     Review of Systems:   Pertinent items are noted in HPI Denies abnormal vaginal discharge w/ itching/odor/irritation, headaches, visual changes, shortness of breath, chest pain, abdominal pain, severe nausea/vomiting, or problems with urination or bowel movements unless otherwise stated above. Pertinent History Reviewed:  Reviewed past medical,surgical, social, obstetrical and family history.  Reviewed problem list, medications and allergies. Physical Assessment:   Vitals:   03/23/23 0951  BP: 117/78  Pulse: (!) 124  Weight: 126 lb (57.2 kg)  Body mass index is 23.05 kg/m.           Physical Examination:   General appearance: alert, well appearing, and in no distress  Mental status: normal mood, behavior, speech, dress, motor activity, and thought processes  Skin:  warm & dry   Extremities:      Cardiovascular: normal heart rate noted  Respiratory: normal respiratory effort, no distress  Abdomen: gravid, soft, non-tender  Pelvic: Cervical exam deferred         Fetal Status: Fetal Heart Rate (bpm): 145 Fundal Height: 22 cm Movement: Present    Fetal Surveillance Testing today: doppler   Chaperone: N/A    No results found for this or any previous visit (from the past 24 hour(s)).   Assessment & Plan:  High-risk pregnancy: G3P2002 at [redacted]w[redacted]d with an Estimated Date of Delivery: 07/18/23   -Club foot with short femur- MFM appt scheduled  -OUD- no changes Growth q 4wks in 3rd trimester  Meds: No orders of the defined types were placed in this encounter.   Labs/procedures today: none  Treatment Plan:  continue routine OB care  Reviewed: Preterm labor symptoms and general obstetric precautions including but not limited to vaginal bleeding, contractions, leaking of fluid and fetal movement were reviewed in detail with the patient.  All questions were answered.   Follow-up: Return in about 4 weeks (around 04/20/2023) for [place] visit and PN-2 and growth every 4 wks (28-32-36).   Future Appointments  Date Time Provider [place]  03/30/2023 12:30 PM [facility] NURSE [facility]  03/30/2023 12:45 PM [facility] US4 [facility]  03/30/2023  1:30 PM [facility] GENETIC COUNSELING RM [facility]  04/20/2023  9:10 AM [facility]FT[facility]N LAB [facility]  04/20/2023 10:10 AM Roma Schanz, CNM [facility]  04/27/2023  10:00 AM [facility] - [facility]TO[facility]YN [place] [facility]-FTIMG None  05/25/2023 10:00 AM [facility] - [facility] [place] [facility] None  06/22/2023 10:00 AM [facility] - [facility] [place] [facility] None    Orders Placed This Encounter  Procedures   US OB Follow Up    Janyth Pupa, DO Attending [place], [place] for [employer]

## 2023-03-30 ENCOUNTER — Ambulatory Visit: Payer: Medicaid Other

## 2023-03-30 ENCOUNTER — Ambulatory Visit: Payer: Medicaid Other | Attending: Women's Health

## 2023-03-30 ENCOUNTER — Ambulatory Visit: Payer: Medicaid Other | Admitting: *Deleted

## 2023-03-30 ENCOUNTER — Encounter: Payer: Self-pay | Admitting: *Deleted

## 2023-03-30 ENCOUNTER — Other Ambulatory Visit: Payer: Self-pay | Admitting: *Deleted

## 2023-03-30 VITALS — BP 103/59 | HR 83

## 2023-03-30 DIAGNOSIS — O35HXX0 Maternal care for other (suspected) fetal abnormality and damage, fetal lower extremities anomalies, not applicable or unspecified: Secondary | ICD-10-CM | POA: Diagnosis not present

## 2023-03-30 DIAGNOSIS — O321XX0 Maternal care for breech presentation, not applicable or unspecified: Secondary | ICD-10-CM | POA: Insufficient documentation

## 2023-03-30 DIAGNOSIS — O99322 Drug use complicating pregnancy, second trimester: Secondary | ICD-10-CM

## 2023-03-30 DIAGNOSIS — F1120 Opioid dependence, uncomplicated: Secondary | ICD-10-CM

## 2023-03-30 DIAGNOSIS — O99332 Smoking (tobacco) complicating pregnancy, second trimester: Secondary | ICD-10-CM | POA: Diagnosis not present

## 2023-03-30 DIAGNOSIS — Z3A24 24 weeks gestation of pregnancy: Secondary | ICD-10-CM | POA: Diagnosis not present

## 2023-03-30 DIAGNOSIS — Z3482 Encounter for supervision of other normal pregnancy, second trimester: Secondary | ICD-10-CM

## 2023-03-30 DIAGNOSIS — O359XX0 Maternal care for (suspected) fetal abnormality and damage, unspecified, not applicable or unspecified: Secondary | ICD-10-CM | POA: Diagnosis present

## 2023-03-30 DIAGNOSIS — Q6689 Other  specified congenital deformities of feet: Secondary | ICD-10-CM | POA: Insufficient documentation

## 2023-03-30 DIAGNOSIS — O09299 Supervision of pregnancy with other poor reproductive or obstetric history, unspecified trimester: Secondary | ICD-10-CM | POA: Insufficient documentation

## 2023-03-30 DIAGNOSIS — O3513X0 Maternal care for (suspected) chromosomal abnormality in fetus, trisomy 21, not applicable or unspecified: Secondary | ICD-10-CM

## 2023-03-30 DIAGNOSIS — Z3480 Encounter for supervision of other normal pregnancy, unspecified trimester: Secondary | ICD-10-CM | POA: Insufficient documentation

## 2023-03-30 DIAGNOSIS — Z363 Encounter for antenatal screening for malformations: Secondary | ICD-10-CM | POA: Insufficient documentation

## 2023-03-30 DIAGNOSIS — O99333 Smoking (tobacco) complicating pregnancy, third trimester: Secondary | ICD-10-CM

## 2023-03-30 DIAGNOSIS — O99335 Smoking (tobacco) complicating the puerperium: Secondary | ICD-10-CM | POA: Insufficient documentation

## 2023-03-30 DIAGNOSIS — F1190 Opioid use, unspecified, uncomplicated: Secondary | ICD-10-CM

## 2023-03-30 DIAGNOSIS — F1910 Other psychoactive substance abuse, uncomplicated: Secondary | ICD-10-CM

## 2023-04-07 ENCOUNTER — Encounter: Payer: Self-pay | Admitting: Obstetrics and Gynecology

## 2023-04-07 ENCOUNTER — Ambulatory Visit: Payer: Medicaid Other

## 2023-04-07 ENCOUNTER — Other Ambulatory Visit: Payer: Self-pay

## 2023-04-07 ENCOUNTER — Ambulatory Visit: Payer: Medicaid Other | Attending: Maternal & Fetal Medicine

## 2023-04-07 ENCOUNTER — Ambulatory Visit: Payer: Self-pay | Admitting: Maternal & Fetal Medicine

## 2023-04-07 DIAGNOSIS — O359XX0 Maternal care for (suspected) fetal abnormality and damage, unspecified, not applicable or unspecified: Secondary | ICD-10-CM

## 2023-04-07 DIAGNOSIS — Z3A24 24 weeks gestation of pregnancy: Secondary | ICD-10-CM | POA: Diagnosis not present

## 2023-04-07 DIAGNOSIS — O35HXX0 Maternal care for other (suspected) fetal abnormality and damage, fetal lower extremities anomalies, not applicable or unspecified: Secondary | ICD-10-CM | POA: Diagnosis not present

## 2023-04-12 NOTE — Progress Notes (Signed)
Virtual Visit via Video Note  I connected with [name] on 04/12/23 at 10:00 AM EDT by a video enabled telemedicine application and verified that I am speaking with the correct person using two identifiers.  Location: Patient: home Provider: [facility]   I discussed the limitations of evaluation and management by telemedicine and the availability of in person appointments. The patient expressed understanding and agreed to proceed.  Referring provider: Myna Hidalgo, DO [facility]  Length of consultation: 30 minutes   Rebekah [name] was seen for genetic counseling at [facility] to review her pregnancy's ultrasound findings and recent aneuploidy screening. She was present on this virtual visit alone. The patient was counseled by Sheppard Plumber, genetic counseling intern, supervised by Katrina Stack, MS, CGC.   Aneuploidy screening:  Rebekah [name] had a nuchal translucency measurement performed during her prenatal ultrasound on 01/14/2023 at [redacted]w[redacted]d gestation. The nuchal translucency measured at 3.1 mm. Nuchal translucency (NT) is the appearance of a collection of fluid under the skin behind the fetal neck in the first trimester of pregnancy. An NT ultrasound is typically conducted between [redacted]w[redacted]d and [redacted]w[redacted]d gestation, or when the crown lump length (CRL) is between 45 and 84 mm. An NT greater than 3 mm is considered a soft marker most commonly associated with fetal aneuploidies, such as trisomy 21 (Down syndrome) and monosomy X (Turner syndrome). Euploid fetuses with increased NT may present with structural anomalies such as cardiac defects, diaphragmatic hernias, exomphalos, body stalk anomalies, and skeletal defects, as well as genetic syndromes such as congenital adrenal hyperplasia, fetal akinesia, and Noonan syndrome. An isolated increased NT is not diagnostic of any condition, and it is recommended that pregnant people with an isolated  increased fetal NT receive counseling to estimate the probability of aneuploidy and to discuss aneuploidy screening, such as cell-free DNA screening or maternal serum screening. Moreover, a fetal echocardiogram can be offered due to the increased chance of congenital heart defects associated with an increased NT.    Rebekah [name] had Serum Integrated 1 and 2 screening performed through [facility]. The results of the screen returned as high-risk for Down syndrome, with a 1 in 43 (2.3%) chance for the fetus to be affected with Down syndrome (trisomy 21). This means there is a 42 in 43 (97.7%) chance the fetus is not affected with Down syndrome. The report returned low risk for trisomy 18 (<1/10,000 residual risk) and open neural tube defects (<1/10,000 residual risk). The Integrated serum screen analyzes five serum markers, pregnancy-associated plasma protein A (PAPP-A), alpha-fetoprotein (AFP), dimeric inhibin A (DIA), chorionic gonadotropin (hCG), and unconjugated estriol (uE3), as well as the nuchal translucency measurement obtained during the first trimester ultrasound. These markers are produced by the pregnancy and can give risk estimates for fetal Down syndrome, trisomy 18, and open neural tube defects (ONTDs). We discussed that this screen does have detection rates lower than those for cfDNA and will not provide information regarding chromosome 13 and the sex chromosomes. We discussed that this screen does factor in maternal weight, age, ethnic background, and gestational age. Moreover, the Integrated serum screen is not a diagnostic test and may have false positives or false negatives. It can detect approximately 80-90% of trisomy 68, trisomy 64, or open neural tube defect cases. Therefore, around 10-20% of these cases may be missed by this screen. A low-risk result does not exclude the chance of aneuploidies or ONTDs.   Rebekah [name]  also had Panorama cell-free DNA (cfDNA) screening performed through [facility]. This  test can provide risk estimates regarding the presence or absence of extra placental DNA for chromosomes 13, 18, and 21, as well as information regarding the sex chromosomes, triploidy, and 22q11.2 deletion syndrome, with detection rates of 99% for trisomy 21, 94% for trisomy 53, and >99% for trisomy 41. This is not a diagnostic test and may have false positives or negatives. A low-risk result greatly reduces but does not eliminate the chance of the fetus having aneuploidy.   We discussed the benefits and limitations of both aneuploidy serum screens and offered diagnostic testing for aneuploidy and single-gene changes by amniocentesis. Amniocentesis is a diagnostic test performed starting at [redacted]w[redacted]d gestational age that involves removing a sample of the amniotic fluid surrounding the fetus. The sample will be analyzed for any chromosome differences with a >99% detection rate. Testing includes a fetal karyotype with reflex to a chromosomal microarray if the karyotype returns as negative. Testing can also include analysis for changes in various genes through single-gene or gene panel tests, as well as AFP testing to detect open neural tube defects. Complications with this test can include bleeding, cramping, maternal fever/infections, fluid leakage, and pregnancy loss. The risk for pregnancy loss from amniocentesis is 1/500.     Anatomy Ultrasound Findings:  Rebekah [name] had an anatomy ultrasound at her Ob/GYyn office on 02/23/2023 at [redacted]w[redacted]d gestation, which showed the fetal left femur (2.6 cm) to be shorter than the right femur (2.96 cm). 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 She had a detailed anatomy ultrasound in the [facility]etal Care clinic on 03/30/2023 at [redacted]w[redacted]d gestation that showed fetus is small for gestational age (SGA), as both ulnas and femurs measured short. Right-sided humerus and left-sided tibia also measured short, and FL/AC measured normal. This ultrasound also confirmed the club foot. Please  see ultrasound report for more details.    We discussed the various genetic and environmental etiologies for the ultrasound findings in fetal bones and clubfoot. Short long bones, including the femur, can be caused by genetic factors, including skeletal dysplasias and aneuploidies such as trisomy 81, trisomy 77, and trisomy 13. Other causes include intrauterine growth restriction (IUGR), which results in a baby small for gestational age, and can be due to genetic factors listed above and environmental/maternal factors, such as complications with the placenta, maternal infections including cytomegalovirus, rubella, toxoplasmosis, and syphilis, certain medications, and drug, alcohol, or tobacco use. We discussed that these findings may not be linked to a genetic condition and may be seen in overall healthy pregnancies. We discussed that some babies may simply be smaller than expected, which can be due to shorter than average stature in the family (the father of the pregnancy is 5'2 and Ms. Lafave is 5'3). Ms. Bennett reported a history of smoking around 1x pack per day during pregnancy, and we discussed that this can also contribute to outcomes such as SGA, premature labor, and miscarriage.    Congenital clubfoot, also known as talipes equinovarus, is a positional foot abnormality in which the foot points downward and inward, affecting around 1 in 1,000 livebirths, with a female to female ratio of 2:1. Around half of cases are bilateral, or affecting both feet. Clubfoot etiology is multifactorial, caused by a combination of genetic and environmental factors. Approximately 80% of cases are idiopathic. Clubfoot can be associated with genetic conditions, such as trisomy 18, 22q11.2 deletion syndrome, and distal arthrogryposes. Other syndromes include amniotic band  syndrome, multiple pterygium syndrome, and neural tube defects. Approximately 25% of clubfoot cases are familial. Recurrence risk can be up to 25% if both the  parent and child are affected and up to 2% if a female child is affected or 5% if a female child is affected.   We discussed that the combination of fetal ultrasound findings is not clearly suggestive of any specific condition or syndrome. We offered follow-up testing, including amniocentesis (discussed above), and single gene cell-free DNA screening through [facility]). [facility] screens for 25 autosomal dominant and X-linked genetic conditions in the fetus through a maternal blood sample, including conditions that can be associated with shorter long bones and increased nuchal translucency. While [facility] can detect around 99% of cases affected with the conditions on their panel, it is important to remember that it is a screening test and only can evaluate for very specific conditions.  Any abnormal results should be confirmed through amniocentesis or testing after birth, and it cannot exclude other causes for the findings. Ms. Commisso shared that she would like to discuss V[facility]ara with her fianc and would inform us if she wishes to proceed with testing. She declined amniocentesis due to the risk of pregnancy loss. Moreover, Ms. Schaad shared that any prenatal diagnoses would not impact her current pregnancy outcomes.    Family/Pregnancy History:  We obtained a detailed family and pregnancy history. This is the third pregnancy for Ms. Reep. She has a healthy 13 year old daughter, born at around 6 lbs, and a healthy 109-year-old daughter, born at around 5 lbs, from a separate partnership. Her reproductive partner has a healthy 25 year old from a previous partnership. Ms. Thoennes reported no history of pregnancy complications, alcohol, or recreational drug use. She reports smoking one pack of cigarettes per day during this pregnancy. She is currently on methadone.    Ms. Hanak reported the following about her family history: Her sister had twins (female and female) who were born premature at around 1 lbs. We discussed  that it can be common for twins to be born prematurely, and it is reassuring that they are currently healthy. Her mother had two miscarriages. Her paternal grandmother had at least two miscarriages. Her paternal grandmother's sister had a daughter born with bilateral club foot and a son born with "one side of the body much shorter than the other side." She reported that this paternal great aunt had a history of substance use during both pregnancies. We reviewed that bilateral club foot is multifactorial and can be due to a combination of genetic and environmental factors. We discussed that asymmetry in growth can be due to genetics, environmental factors, or both. We discussed that her paternal great aunt's history of prenatal substance use may have contributed to these findings, but without a known genetic cause, we will not be able to offer detailed risk assessments. We also cannot exclude a genetic component for these two individuals who have similar findings to the current pregnancy. The remainder of her family history was unremarkable for learning difficulties, autism, developmental delay, stillbirth, or genetic conditions. Her partner's family history was unremarkable.    Plan of Care:  Ms. Carnegie will discuss Vistara with her fianc and determine whether they would like to pursue testing.  We will continue monitoring fetal growth through a follow up ultrasound, scheduled on 04/27/23 at [facility].  A referral to the [facility] clinic was sent to schedule a fetal echocardiogram due to the increased NT measurement.   Consider  referral for orthopedic consult for the unilateral clubfoot in the third trimester.    We appreciate being involved in the care of this family and can be reached at [phone].   Cherly Anderson, MS, CGC  I provided 30 minutes of non-face-to-face time during this encounter.   Katrina Stack

## 2023-04-20 ENCOUNTER — Other Ambulatory Visit: Payer: Medicaid Other

## 2023-04-20 ENCOUNTER — Other Ambulatory Visit: Payer: Self-pay | Admitting: Women's Health

## 2023-04-20 ENCOUNTER — Encounter: Payer: Self-pay | Admitting: Women's Health

## 2023-04-20 ENCOUNTER — Ambulatory Visit (INDEPENDENT_AMBULATORY_CARE_PROVIDER_SITE_OTHER): Payer: Medicaid Other | Admitting: Women's Health

## 2023-04-20 VITALS — BP 118/73 | HR 125 | Wt 136.0 lb

## 2023-04-20 DIAGNOSIS — Z3A27 27 weeks gestation of pregnancy: Secondary | ICD-10-CM

## 2023-04-20 DIAGNOSIS — O0992 Supervision of high risk pregnancy, unspecified, second trimester: Secondary | ICD-10-CM

## 2023-04-20 DIAGNOSIS — F1990 Other psychoactive substance use, unspecified, uncomplicated: Secondary | ICD-10-CM

## 2023-04-20 DIAGNOSIS — Z23 Encounter for immunization: Secondary | ICD-10-CM

## 2023-04-20 DIAGNOSIS — O285 Abnormal chromosomal and genetic finding on antenatal screening of mother: Secondary | ICD-10-CM

## 2023-04-20 DIAGNOSIS — O0990 Supervision of high risk pregnancy, unspecified, unspecified trimester: Secondary | ICD-10-CM

## 2023-04-20 DIAGNOSIS — O99322 Drug use complicating pregnancy, second trimester: Secondary | ICD-10-CM

## 2023-04-20 DIAGNOSIS — Z3480 Encounter for supervision of other normal pregnancy, unspecified trimester: Secondary | ICD-10-CM

## 2023-04-20 DIAGNOSIS — F1120 Opioid dependence, uncomplicated: Secondary | ICD-10-CM

## 2023-04-20 DIAGNOSIS — Z131 Encounter for screening for diabetes mellitus: Secondary | ICD-10-CM

## 2023-04-20 DIAGNOSIS — O35HXX0 Maternal care for other (suspected) fetal abnormality and damage, fetal lower extremities anomalies, not applicable or unspecified: Secondary | ICD-10-CM

## 2023-04-20 MED ORDER — ACCU-CHEK GUIDE ME W/DEVICE KIT
1.0000 | PACK | Freq: Four times a day (QID) | 0 refills | Status: DC
Start: 2023-04-20 — End: 2023-04-21

## 2023-04-20 MED ORDER — ACCU-CHEK SOFTCLIX LANCETS MISC
1 refills | Status: DC
Start: 2023-04-20 — End: 2023-06-29

## 2023-04-20 MED ORDER — ACCU-CHEK GUIDE VI STRP
ORAL_STRIP | 1 refills | Status: DC
Start: 2023-04-20 — End: 2023-06-29

## 2023-04-20 NOTE — Progress Notes (Signed)
HIGH-RISK PREGNANCY VISIT Patient name: Rebekah [name] MRN [id number]  Date of birth: [birth year] Chief Complaint:   Routine Prenatal Visit  History of Present Illness:   Rebekah [name] is a 32 y.o. 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     Today she reports  didn't know was having sugar test today, so was late. Does not want to do GTT, would rather check sugars. [facility] increased her methadone to /day yesteray (was having w/drawal sx in am) . Contractions: Not present.  .  Movement: Present. denies leaking of fluid.      01/14/2023    8:10 AM  Depression screen PHQ 2/9  Decreased Interest 2  Down, Depressed, Hopeless 2  PHQ - 2 Score 4  Altered sleeping 2  Tired, decreased energy 2  Change in appetite 1  Feeling bad or failure about yourself  2  Trouble concentrating 2  Moving slowly or fidgety/restless 1  Suicidal thoughts 0  PHQ-9 Score 14        01/14/2023    8:10 AM  GAD 7 : Generalized Anxiety Score  Nervous, Anxious, on Edge 2  Control/stop worrying 2  Worry too much - different things 2  Trouble relaxing 2  Restless 1  Easily annoyed or irritable 3  Afraid - awful might happen 1  Total GAD 7 Score 13     Review of Systems:   Pertinent items are noted in HPI Denies abnormal vaginal discharge w/ itching/odor/irritation, headaches, visual changes, shortness of breath, chest pain, abdominal pain, severe nausea/vomiting, or problems with urination or bowel movements unless otherwise stated above. Pertinent History Reviewed:  Reviewed past medical,surgical, social, obstetrical and family history.  Reviewed problem list, medications and allergies. Physical Assessment:   Vitals:   04/20/23 1021  BP: 118/73  Pulse: (!) 125  Weight: 136 lb (61.7 kg)  Body mass index is 24.87 kg/m.            Physical Examination:   General appearance: alert, well appearing, and in no distress  Mental status: alert, oriented to person, place, and time  Skin: warm & dry   Extremities: Edema: Trace    Cardiovascular: normal heart rate noted  Respiratory: normal respiratory effort, no distress  Abdomen: gravid, soft, non-tender  Pelvic: Cervical exam deferred         Fetal Status: Fetal Heart Rate (bpm): 142 Fundal Height: 28 cm Movement: Present    Fetal Surveillance Testing today: doppler   Chaperone: N/A    No results found for this or any previous visit (from the past 24 hour(s)).  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 Discussed NAS, consult ordered today  2) Fetal thickened NT, club foot, short femur, IT ^r/f T21, NIPS low-risk, seeing MFM, declined amnio, has appt for f/u u/s next week. Doing all u/s w/ MFM  3) Doesn't want to do GTT> offered QID testing x 2wks, would rather do this. Explained checking sugars and printed info given. To send [place] a pic of sugars weekly x 2wks via MyChart. Will do PN2 (minus GTT) today  Meds:  Meds ordered this encounter  Medications   Accu-Chek Softclix Lancets lancets  Sig: Use as instructed to check blood sugar 4 times daily for 2 weeks    Dispense:  50 each    Refill:  1   glucose blood (ACCU-CHEK GUIDE) test strip    Sig: Use as instructed to check blood sugar 4 times daily for 2 weeks    Dispense:  50 each    Refill:  1   Blood Glucose Monitoring Suppl (ACCU-CHEK GUIDE ME) w/Device KIT    Sig: 1 each by Does not apply route 4 (four) times daily.    Dispense:  1 kit    Refill:  0    Please use [id number]  RXPCN:[id number]   [id number]  FA:[phone]    Issuer:[id number]   Labs/procedures today:  PN2 (minus GTT), tdap  Treatment Plan:  U/S q4wks w/ MFM    Reviewed: Preterm  labor symptoms and general obstetric precautions including but not limited to vaginal bleeding, contractions, leaking of fluid and fetal movement were reviewed in detail with the patient.  All questions were answered. Does have home bp cuff. Office bp cuff given: not applicable. Check bp weekly, let us know if consistently >140 and/or >90.  Follow-up: Return for cancel 4/30 u/s and appt here (and all future u/s here); next appt 3wks HROB w/ MD.   Future Appointments  Date Time Provider Department Center  04/27/2023  1:45 PM [facility] NURSE [facility]  04/27/2023  1:45 PM [facility] US4 [facility]    Orders Placed This Encounter  Procedures   Tdap vaccine greater than or equal to 7yo IM   Amb referral to Neonatal Abstinence Syndrome (NAS) Team   Cheral Marker CNM, [facility] 04/20/2023 11:04 AM

## 2023-04-20 NOTE — Addendum Note (Signed)
Addended by: Moss Mc on: 04/20/2023 10:57 AM   Modules accepted: Orders

## 2023-04-20 NOTE — Patient Instructions (Signed)
Rebekah [name], thank you for choosing our office today! We appreciate the opportunity to meet your healthcare needs. You may receive a short survey by mail, e-mail, or through [employer]. If you are happy with your care we would appreciate if you could take just a few minutes to complete the survey questions. We read all of your comments and take your feedback very seriously. Thank you again for choosing our office.  Center for [employer] Team at [facility]  [facility] at [facility] ([address] [place], [place] [address]) Entrance C, located off of [address] [employer] [address] valet parking   **Check blood sugars 4 times a day: in the morning before eating/drinking anything (goal is <95) and 2 hours after your first bite of breakfast, lunch, and supper (goal is <120). Please keep a log (Example 05/26/21: 95, 120, 120, 120) and send [place] a picture once a week for the next 2 weeks**  CLASSES: Go to [email or web address] to register for classes (childbirth, breastfeeding, waterbirth, infant CPR, daddy bootcamp, etc.)  Call the office [phone]) or go to [facility] if: You begin to have strong, frequent contractions Your water breaks.  Sometimes it is a big gush of fluid, sometimes it is just a trickle that keeps getting your panties wet or running down your legs You have vaginal bleeding.  It is normal to have a small amount of spotting if your cervix was checked.  You don't feel your baby moving like normal.  If you don't, get you something to eat and drink and lay down and focus on feeling your baby move.   If your baby is still not moving like normal, you should call the office or go to [facility].  Call the office [phone]) or go to [facility] hospital for these signs of pre-eclampsia: Severe headache that does not go away with Tylenol Visual changes- seeing spots, double, blurred vision Pain under your right breast or upper abdomen that does not go away with Tums or heartburn  medicine Nausea and/or vomiting Severe swelling in your hands, feet, and face   Tdap Vaccine It is recommended that you get the Tdap vaccine during the third trimester of EACH pregnancy to help protect your baby from getting pertussis (whooping cough) 27-36 weeks is the BEST time to do this so that you can pass the protection on to your baby. During pregnancy is better than after pregnancy, but if you are unable to get it during pregnancy it will be offered at the hospital.  You can get this vaccine with [place], at the health department, your family doctor, or some local pharmacies Everyone who will be around your baby should also be up-to-date on their vaccines before the baby comes. Adults (who are not pregnant) only need 1 dose of Tdap during adulthood.   [facility] Pediatricians/Family Doctors [facility]): [address] Colette Ribas, [phone]           [facility]: [address], [phone]                [facility]): [address], [phone] (call to ask if accepting patients) [facility] Department: [address], [place], [phone]    [facility] Pediatricians/Family Doctors [facility]): [phone] [address], [phone] [facility]: [address] [place], [phone] [facility]: [address], [phone]  [facility] Doctors  [facility]): [phone] [facility]: [address], [phone]   [facility] Doctors [facility]: [address] [phone]  [facility] Doctors  [facility]: [phone], [phone]  Home Blood Pressure Monitoring for Patients   Your provider has recommended that you check your blood pressure (BP) at least once a week at home. If you do not have a blood pressure cuff at home, one will be provided  for you. Contact your provider if you have not received your monitor within 1 week.   Helpful Tips for Accurate Home Blood Pressure Checks  Don't smoke, exercise, or drink caffeine 30 minutes before checking your BP Use the restroom before checking your BP (a full bladder can raise your pressure) Relax in a comfortable upright chair Feet on the ground Left arm resting comfortably on a flat surface at the level of your heart Legs uncrossed Back supported Sit quietly and don't talk Place the cuff on your bare arm Adjust snuggly, so that only two fingertips can fit between your skin and the top of the cuff Check 2 readings separated by at least one minute Keep a log of your BP readings For a visual, please reference this diagram: http://ccnc.care/bpdiagram  Provider Name: [facility]     Phone: [phone]  Zone 1: ALL CLEAR  Continue to monitor your symptoms:  BP reading is less than 140 (top number) or less than 90 (bottom number)  No right upper stomach pain No headaches or seeing spots No feeling nauseated or throwing up No swelling in face and hands  Zone 2: CAUTION Call your doctor's office for any of the following:  BP reading is greater than 140 (top number) or greater than 90 (bottom number)  Stomach pain under your ribs in the middle or right side Headaches or seeing spots Feeling nauseated or throwing up Swelling in face and hands  Zone 3: EMERGENCY  Seek immediate medical care if you have any of the following:  BP reading is greater than160 (top number) or greater than 110 (bottom number) Severe headaches not improving with Tylenol Serious difficulty catching your breath Any worsening symptoms from Zone 2   Third Trimester of Pregnancy The third trimester is from week 29 through week 42, months 7 through 9. The third trimester is a time when the fetus is growing rapidly. At the end of the ninth month, the fetus is about 20 inches in length and weighs 6-10  pounds.  BODY CHANGES Your body goes through many changes during pregnancy. The changes vary from woman to woman.  Your weight will continue to increase. You can expect to gain 25-35 pounds (11-16 kg) by the end of the pregnancy. You may begin to get stretch marks on your hips, abdomen, and breasts. You may urinate more often because the fetus is moving lower into your pelvis and pressing on your bladder. You may develop or continue to have heartburn as a result of your pregnancy. You may develop constipation because certain hormones are causing the muscles that push waste through your intestines to slow down. You may develop hemorrhoids or swollen, bulging veins (varicose veins). You may have pelvic pain because of the weight gain and pregnancy hormones relaxing your joints between the bones in your pelvis. Backaches may result from overexertion of the muscles supporting your posture. You may have changes in your hair. These can include thickening of your hair, rapid growth, and changes in texture. Some women  also have hair loss during or after pregnancy, or hair that feels dry or thin. Your hair will most likely return to normal after your baby is born. Your breasts will continue to grow and be tender. A yellow discharge may leak from your breasts called colostrum. Your belly button may stick out. You may feel short of breath because of your expanding uterus. You may notice the fetus "dropping," or moving lower in your abdomen. You may have a bloody mucus discharge. This usually occurs a few days to a week before labor begins. Your cervix becomes thin and soft (effaced) near your due date. WHAT TO EXPECT AT YOUR PRENATAL EXAMS  You will have prenatal exams every 2 weeks until week 36. Then, you will have weekly prenatal exams. During a routine prenatal visit: You will be weighed to make sure you and the fetus are growing normally. Your blood pressure is taken. Your abdomen will be measured to  track your baby's growth. The fetal heartbeat will be listened to. Any test results from the previous visit will be discussed. You may have a cervical check near your due date to see if you have effaced. At around 36 weeks, your caregiver will check your cervix. At the same time, your caregiver will also perform a test on the secretions of the vaginal tissue. This test is to determine if a type of bacteria, Group B streptococcus, is present. Your caregiver will explain this further. Your caregiver may ask you: What your birth plan is. How you are feeling. If you are feeling the baby move. If you have had any abnormal symptoms, such as leaking fluid, bleeding, severe headaches, or abdominal cramping. If you have any questions. Other tests or screenings that may be performed during your third trimester include: Blood tests that check for low iron levels (anemia). Fetal testing to check the health, activity level, and growth of the fetus. Testing is done if you have certain medical conditions or if there are problems during the pregnancy. FALSE LABOR You may feel small, irregular contractions that eventually go away. These are called Braxton Hicks contractions, or false labor. Contractions may last for hours, days, or even weeks before true labor sets in. If contractions come at regular intervals, intensify, or become painful, it is best to be seen by your caregiver.  SIGNS OF LABOR  Menstrual-like cramps. Contractions that are 5 minutes apart or less. Contractions that start on the top of the uterus and spread down to the lower abdomen and back. A sense of increased pelvic pressure or back pain. A watery or bloody mucus discharge that comes from the vagina. If you have any of these signs before the 37th week of pregnancy, call your caregiver right away. You need to go to the hospital to get checked immediately. HOME CARE INSTRUCTIONS  Avoid all smoking, herbs, alcohol, and unprescribed drugs.  These chemicals affect the formation and growth of the baby. Follow your caregiver's instructions regarding medicine use. There are medicines that are either safe or unsafe to take during pregnancy. Exercise only as directed by your caregiver. Experiencing uterine cramps is a good sign to stop exercising. Continue to eat regular, healthy meals. Wear a good support bra for breast tenderness. Do not use hot tubs, steam rooms, or saunas. Wear your seat belt at all times when driving. Avoid raw meat, uncooked cheese, cat litter boxes, and soil used by cats. These carry germs that can cause birth defects in the baby. Take your prenatal vitamins.  Try taking a stool softener (if your caregiver approves) if you develop constipation. Eat more high-fiber foods, such as fresh vegetables or fruit and whole grains. Drink plenty of fluids to keep your urine clear or pale yellow. Take warm sitz baths to soothe any pain or discomfort caused by hemorrhoids. Use hemorrhoid cream if your caregiver approves. If you develop varicose veins, wear support hose. Elevate your feet for 15 minutes, 3-4 times a day. Limit salt in your diet. Avoid heavy lifting, wear low heal shoes, and practice good posture. Rest a lot with your legs elevated if you have leg cramps or low back pain. Visit your dentist if you have not gone during your pregnancy. Use a soft toothbrush to brush your teeth and be gentle when you floss. A sexual relationship may be continued unless your caregiver directs you otherwise. Do not travel far distances unless it is absolutely necessary and only with the approval of your caregiver. Take prenatal classes to understand, practice, and ask questions about the labor and delivery. Make a trial run to the hospital. Pack your hospital bag. Prepare the baby's nursery. Continue to go to all your prenatal visits as directed by your caregiver. SEEK MEDICAL CARE IF: You are unsure if you are in labor or if your  water has broken. You have dizziness. You have mild pelvic cramps, pelvic pressure, or nagging pain in your abdominal area. You have persistent nausea, vomiting, or diarrhea. You have a bad smelling vaginal discharge. You have pain with urination. SEEK IMMEDIATE MEDICAL CARE IF:  You have a fever. You are leaking fluid from your vagina. You have spotting or bleeding from your vagina. You have severe abdominal cramping or pain. You have rapid weight loss or gain. You have shortness of breath with chest pain. You notice sudden or extreme swelling of your face, hands, ankles, feet, or legs. You have not felt your baby move in over an hour. You have severe headaches that do not go away with medicine. You have vision changes. Document Released: 12/08/2001 Document Revised: 12/19/2013 Document Reviewed: 02/14/2013 [facility] Patient Information 2015 [place], [place]. This information is not intended to replace advice given to you by your health care provider. Make sure you discuss any questions you have with your health care provider.  AREA PEDIATRIC/FAMILY PRACTICE PHYSICIANS  [facility] [address] [place], [place] [address] Phone - [phone]   Fax - [phone]  JACK AMOS [address] [place], [place]  [address] Phone - [phone]   Fax - [phone]  [facility] [address] [place], [place]  [address] Phone - [phone]   Fax - [phone]  [facility] [address] [place], [place]  [address] Phone - [phone]   Fax - [phone]  [facility] [address] [place], [place]  [address] Phone - [phone]   Fax - [phone]  [facility] [address] [place], [place]  [address] Phone - [phone]   Fax - [phone]  [facility] [place] [address] [place], [place]  [address] Phone - [phone]   Fax  - [phone]  [facility] FAMILY MEDICINE AT [facility] [address] [place], [address] [place], [place]  [address] Phone - [phone]   Fax - [phone]  [facility] FAMILY MEDICINE AT [facility] [address] [place], [place]  [address] Phone - [phone]   Fax - [phone] [facility] AT [place] [address] [place], [place]  [address] Phone - [phone]   Fax - [phone]  [facility] [address] [place], [place]  [address] Phone - [phone]   Fax - [phone]  [facility] [address] [place], [place]  [address] Phone - [phone]   Fax - [phone]  [facility] [place] [address] [place], [place]  [address] Phone - [phone]   Fax - [phone]  [facility] [address] [place], [place]  [address] Phone - [phone]  [facility] [address] [place], [place]  [address] Phone - [phone]   Fax - [phone]  [facility] [address] [place], [place]  [address] Phone - [phone]   Fax - [phone]  [facility] [address] [place], [place]  [address] Phone - [phone]   Fax - [phone]  [facility] [address] [place], [place]  [address] Phone - [phone]   Fax - [phone]   [facility] [address] [place], [place]  [address] Phone - [phone]   Fax - [phone]  [facility] [address] [place], [place]  [address] Phone - [phone]   Fax - [phone]  DAVID RUBIN [address] [place], [place]  [address] Phone - [phone]   Fax - [phone]  [facility] [address] [place], [place]  [address] Phone - [phone]   Fax - [phone]  [facility] [address] [place], [place]   [address] Phone - [phone]   Fax - [phone] Gerarda Fraction [address] [place], [place]  [address] Phone - [phone]   Fax - [phone]  [facility] [address] [place], [place]  [address] Phone - [phone]   Fax - [phone]  [facility] - [place] [address] [place], [place]  [address] Phone - [phone]   Fax - [phone]

## 2023-04-21 LAB — ANTIBODY SCREEN: Antibody Screen: NEGATIVE

## 2023-04-21 LAB — RPR: RPR Ser Ql: NONREACTIVE

## 2023-04-21 LAB — CBC
Hematocrit: 34.8 % (ref 34.0–46.6)
Hemoglobin: 11.5 g/dL (ref 11.1–15.9)
MCH: 30.8 pg (ref 26.6–33.0)
MCHC: 33 g/dL (ref 31.5–35.7)
MCV: 93 fL (ref 79–97)
Platelets: 292 10*3/uL (ref 150–450)
RBC: 3.73 x10E6/uL — ABNORMAL LOW (ref 3.77–5.28)
RDW: 12 % (ref 11.7–15.4)
WBC: 11.8 10*3/uL — ABNORMAL HIGH (ref 3.4–10.8)

## 2023-04-21 LAB — HIV ANTIBODY (ROUTINE TESTING W REFLEX): HIV Screen 4th Generation wRfx: NONREACTIVE

## 2023-04-23 ENCOUNTER — Encounter: Payer: Self-pay | Admitting: *Deleted

## 2023-04-27 ENCOUNTER — Ambulatory Visit: Payer: Medicaid Other | Attending: Maternal & Fetal Medicine

## 2023-04-27 ENCOUNTER — Ambulatory Visit (HOSPITAL_BASED_OUTPATIENT_CLINIC_OR_DEPARTMENT_OTHER): Payer: Medicaid Other | Admitting: Obstetrics

## 2023-04-27 ENCOUNTER — Ambulatory Visit (HOSPITAL_BASED_OUTPATIENT_CLINIC_OR_DEPARTMENT_OTHER): Payer: Medicaid Other | Admitting: *Deleted

## 2023-04-27 ENCOUNTER — Other Ambulatory Visit: Payer: Self-pay | Admitting: Maternal & Fetal Medicine

## 2023-04-27 ENCOUNTER — Other Ambulatory Visit: Payer: Medicaid Other

## 2023-04-27 ENCOUNTER — Encounter: Payer: Medicaid Other | Admitting: Women's Health

## 2023-04-27 ENCOUNTER — Encounter: Payer: Self-pay | Admitting: *Deleted

## 2023-04-27 ENCOUNTER — Ambulatory Visit: Payer: Medicaid Other | Admitting: *Deleted

## 2023-04-27 DIAGNOSIS — O99323 Drug use complicating pregnancy, third trimester: Secondary | ICD-10-CM

## 2023-04-27 DIAGNOSIS — O365930 Maternal care for other known or suspected poor fetal growth, third trimester, not applicable or unspecified: Secondary | ICD-10-CM

## 2023-04-27 DIAGNOSIS — Q6689 Other  specified congenital deformities of feet: Secondary | ICD-10-CM

## 2023-04-27 DIAGNOSIS — F1120 Opioid dependence, uncomplicated: Secondary | ICD-10-CM | POA: Insufficient documentation

## 2023-04-27 DIAGNOSIS — F1910 Other psychoactive substance abuse, uncomplicated: Secondary | ICD-10-CM | POA: Diagnosis present

## 2023-04-27 DIAGNOSIS — O3513X0 Maternal care for (suspected) chromosomal abnormality in fetus, trisomy 21, not applicable or unspecified: Secondary | ICD-10-CM | POA: Diagnosis not present

## 2023-04-27 DIAGNOSIS — O99333 Smoking (tobacco) complicating pregnancy, third trimester: Secondary | ICD-10-CM | POA: Diagnosis present

## 2023-04-27 DIAGNOSIS — Z3A28 28 weeks gestation of pregnancy: Secondary | ICD-10-CM | POA: Diagnosis present

## 2023-04-27 DIAGNOSIS — O35HXX0 Maternal care for other (suspected) fetal abnormality and damage, fetal lower extremities anomalies, not applicable or unspecified: Secondary | ICD-10-CM | POA: Diagnosis present

## 2023-04-27 NOTE — Progress Notes (Signed)
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  She is also being treated with methadone replacement therapy.   She had a cell free DNA test drawn earlier in her pregnancy which indicated a low risk for trisomy 28, 77, and 13.  A female fetus is predicted.  However, her Integrated-2 test indicated an elevated Down syndrome risk of 1:43.  She denies any problems since her last exam and reports feeling vigorous fetal movements throughout the day.    On today's exam, the EFW of 2 pounds 2 ounces measures at the third percentile for her gestational age indicating IUGR.  The patient reports that her two other children were delivered at term weighing between 5 to 6 pounds each.  The total AFI was 17.52 cm (within normal limits).  The patient had a reactive NST for her gestational age today.  Doppler studies of the umbilical arteries showed an elevated S/D ratio of 4.49 .  There were no signs of absent or reversed end-diastolic flow.    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  The patient was advised that a clubfoot can be repaired with good results after birth.  Due to fetal growth restriction, we will continue to follow her with weekly fetal testing and umbilical artery Doppler studies.    We will reassess the fetal growth again in 3 weeks.    She will return in 1 week for another NST and umbilical artery Doppler study.   The patient stated that all of her questions were answered today.  A total of 20 minutes was spent counseling and coordinating the care for this patient.  Greater than 50% of the time was spent in direct face-to-face contact.

## 2023-04-27 NOTE — Procedures (Signed)
Rebekah [name] 08/07/1991 [redacted]w[redacted]d  Fetus A Non-Stress Test Interpretation for 04/27/23  Indication: IUGR  Fetal Heart Rate A Mode: External Baseline Rate (A): 135 bpm Variability: Moderate Accelerations: 15 x 15 Decelerations: Variable Multiple birth?: No  Uterine Activity Mode: Toco Contraction Frequency (min): none Resting Tone Palpated: Relaxed  Interpretation (Fetal Testing) Nonstress Test Interpretation: Reactive Overall Impression: Reassuring for gestational age Comments: tracing reviewed byDr. Parke Poisson

## 2023-04-28 ENCOUNTER — Other Ambulatory Visit: Payer: Self-pay | Admitting: *Deleted

## 2023-04-28 DIAGNOSIS — O365930 Maternal care for other known or suspected poor fetal growth, third trimester, not applicable or unspecified: Secondary | ICD-10-CM

## 2023-04-29 ENCOUNTER — Telehealth: Payer: Self-pay

## 2023-04-29 ENCOUNTER — Other Ambulatory Visit: Payer: Self-pay | Admitting: Women's Health

## 2023-05-03 ENCOUNTER — Ambulatory Visit: Payer: Medicaid Other | Admitting: *Deleted

## 2023-05-03 ENCOUNTER — Ambulatory Visit: Payer: Medicaid Other | Attending: Obstetrics

## 2023-05-03 VITALS — BP 117/76 | HR 108

## 2023-05-03 DIAGNOSIS — Z8759 Personal history of other complications of pregnancy, childbirth and the puerperium: Secondary | ICD-10-CM | POA: Insufficient documentation

## 2023-05-03 DIAGNOSIS — O99320 Drug use complicating pregnancy, unspecified trimester: Secondary | ICD-10-CM | POA: Diagnosis present

## 2023-05-03 DIAGNOSIS — O99333 Smoking (tobacco) complicating pregnancy, third trimester: Secondary | ICD-10-CM | POA: Diagnosis not present

## 2023-05-03 DIAGNOSIS — O365930 Maternal care for other known or suspected poor fetal growth, third trimester, not applicable or unspecified: Secondary | ICD-10-CM

## 2023-05-03 DIAGNOSIS — O99323 Drug use complicating pregnancy, third trimester: Secondary | ICD-10-CM | POA: Diagnosis present

## 2023-05-03 DIAGNOSIS — F1910 Other psychoactive substance abuse, uncomplicated: Secondary | ICD-10-CM | POA: Diagnosis present

## 2023-05-03 DIAGNOSIS — O3513X0 Maternal care for (suspected) chromosomal abnormality in fetus, trisomy 21, not applicable or unspecified: Secondary | ICD-10-CM | POA: Diagnosis not present

## 2023-05-03 DIAGNOSIS — O35HXX0 Maternal care for other (suspected) fetal abnormality and damage, fetal lower extremities anomalies, not applicable or unspecified: Secondary | ICD-10-CM | POA: Diagnosis not present

## 2023-05-03 DIAGNOSIS — F1120 Opioid dependence, uncomplicated: Secondary | ICD-10-CM

## 2023-05-03 DIAGNOSIS — O365990 Maternal care for other known or suspected poor fetal growth, unspecified trimester, not applicable or unspecified: Secondary | ICD-10-CM | POA: Insufficient documentation

## 2023-05-03 DIAGNOSIS — Z3A29 29 weeks gestation of pregnancy: Secondary | ICD-10-CM

## 2023-05-03 NOTE — Procedures (Addendum)
Arlone L Batton Jan 06, 1991 [redacted]w[redacted]d  Fetus A Non-Stress Test Interpretation for 05/03/23  Indication: IUGR  Fetal Heart Rate A Mode: External Baseline Rate (A): 130 bpm Variability: Moderate Accelerations: 15 x 15 Decelerations: None  Uterine Activity Mode: Palpation, Toco Contraction Frequency (min): None  Interpretation (Fetal Testing) Nonstress Test Interpretation: Reactive Comments: Dr. Darra Lis reviewed tracing.

## 2023-05-04 ENCOUNTER — Telehealth: Payer: Medicaid Other | Admitting: Family Medicine

## 2023-05-04 DIAGNOSIS — Z3A29 29 weeks gestation of pregnancy: Secondary | ICD-10-CM

## 2023-05-04 DIAGNOSIS — R21 Rash and other nonspecific skin eruption: Secondary | ICD-10-CM

## 2023-05-04 NOTE — Progress Notes (Signed)
[place]   Rash that is painful- [redacted] weeks pregnant- needs in person assessment  Patient acknowledged agreement and understanding of the plan.

## 2023-05-06 ENCOUNTER — Ambulatory Visit (INDEPENDENT_AMBULATORY_CARE_PROVIDER_SITE_OTHER): Payer: Medicaid Other | Admitting: Obstetrics & Gynecology

## 2023-05-06 VITALS — BP 128/79 | HR 94 | Wt 138.0 lb

## 2023-05-06 DIAGNOSIS — Z3A29 29 weeks gestation of pregnancy: Secondary | ICD-10-CM

## 2023-05-06 DIAGNOSIS — O0993 Supervision of high risk pregnancy, unspecified, third trimester: Secondary | ICD-10-CM

## 2023-05-06 DIAGNOSIS — B029 Zoster without complications: Secondary | ICD-10-CM

## 2023-05-06 MED ORDER — GABAPENTIN 300 MG PO CAPS: 300.0000 mg | ORAL_CAPSULE | Freq: Three times a day (TID) | ORAL | 0 refills | Status: DC

## 2023-05-06 MED ORDER — VALACYCLOVIR HCL 1 G PO TABS
1000.0000 mg | ORAL_TABLET | Freq: Three times a day (TID) | ORAL | 0 refills | Status: DC
Start: 2023-05-06 — End: 2023-06-14

## 2023-05-06 NOTE — Progress Notes (Signed)
   LOW-RISK PREGNANCY VISIT Patient name: Rebekah [name] MRN [id number]  Date of birth: 11-27-1991 Chief Complaint:   Leg Pain (Also has rash)  History of Present Illness:   Rebekah [name] is a 32 y.o. G31P2002 female at [redacted]w[redacted]d with an Estimated Date of Delivery: 07/18/23 being seen today for ongoing management of a low-risk pregnancy.     01/14/2023    8:10 AM  Depression screen PHQ 2/9  Decreased Interest 2  Down, Depressed, Hopeless 2  PHQ - 2 Score 4  Altered sleeping 2  Tired, decreased energy 2  Change in appetite 1  Feeling bad or failure about yourself  2  Trouble concentrating 2  Moving slowly or fidgety/restless 1  Suicidal thoughts 0  PHQ-9 Score 14    Today she reports sacral shingles right leg. Contractions: Not present. Vag. Bleeding: None.  Movement: Present. denies leaking of fluid. Review of Systems:   Pertinent items are noted in HPI Denies abnormal vaginal discharge w/ itching/odor/irritation, headaches, visual changes, shortness of breath, chest pain, abdominal pain, severe nausea/vomiting, or problems with urination or bowel movements unless otherwise stated above. Pertinent History Reviewed:  Reviewed past medical,surgical, social, obstetrical and family history.  Reviewed problem list, medications and allergies. Physical Assessment:   Vitals:   05/06/23 1603  BP: 128/79  Pulse: 94  Weight: 138 lb (62.6 kg)  Body mass index is 25.24 kg/m.        Physical Examination:   General appearance: Well appearing, and in no distress  Mental status: Alert, oriented to person, place, and time  Skin: Warm & dry  Cardiovascular: Normal heart rate noted  Respiratory: Normal respiratory effort, no distress  Abdomen: Soft, gravid, nontender  Pelvic: Cervical exam deferred         Extremities:    Fetal Status:     Movement: Present    Chaperone: n/a    No results found for this or any previous visit (from the past 24 hour(s)).  Assessment & Plan:  1) Low-risk  pregnancy G4P2002 at [redacted]w[redacted]d with an Estimated Date of Delivery: 07/18/23   2) shingles in lumbar 3 distribution inner right thigh, valtrex 1 gram q8h x 14-21 d Also neurontin 300 q8h   Meds:  Meds ordered this encounter  Medications   gabapentin (NEURONTIN) 300 MG capsule    Sig: Take 1 capsule (300 mg total) by mouth 3 (three) times daily.    Dispense:  60 capsule    Refill:  0   valACYclovir (VALTREX) 1000 MG tablet    Sig: Take 1 tablet (1,000 mg total) by mouth every 8 (eight) hours.    Dispense:  42 tablet    Refill:  0   Labs/procedures today:   Plan:  Continue routine obstetrical care  Next visit: prefers in person    Reviewed: Preterm labor symptoms and general obstetric precautions including but not limited to vaginal bleeding, contractions, leaking of fluid and fetal movement were reviewed in detail with the patient.  All questions were answered. Has home bp cuff. Rx faxed to . Check bp weekly, let us know if >140/90.   Follow-up: Return for keep scheduled.  No orders of the defined types were placed in this encounter.   Lazaro Arms, MD 05/06/2023 4:16 PM

## 2023-05-11 ENCOUNTER — Encounter: Payer: Self-pay | Admitting: *Deleted

## 2023-05-11 ENCOUNTER — Encounter: Payer: Medicaid Other | Admitting: Obstetrics & Gynecology

## 2023-05-11 ENCOUNTER — Ambulatory Visit: Payer: Medicaid Other | Admitting: *Deleted

## 2023-05-11 ENCOUNTER — Encounter: Payer: Self-pay | Admitting: Obstetrics & Gynecology

## 2023-05-11 ENCOUNTER — Ambulatory Visit: Payer: Medicaid Other | Attending: Obstetrics

## 2023-05-11 ENCOUNTER — Ambulatory Visit (INDEPENDENT_AMBULATORY_CARE_PROVIDER_SITE_OTHER): Payer: Medicaid Other | Admitting: Obstetrics & Gynecology

## 2023-05-11 ENCOUNTER — Ambulatory Visit (HOSPITAL_BASED_OUTPATIENT_CLINIC_OR_DEPARTMENT_OTHER): Payer: Medicaid Other | Admitting: *Deleted

## 2023-05-11 VITALS — BP 135/86 | HR 74 | Wt 140.0 lb

## 2023-05-11 DIAGNOSIS — O99323 Drug use complicating pregnancy, third trimester: Secondary | ICD-10-CM

## 2023-05-11 DIAGNOSIS — O0993 Supervision of high risk pregnancy, unspecified, third trimester: Secondary | ICD-10-CM

## 2023-05-11 DIAGNOSIS — O0990 Supervision of high risk pregnancy, unspecified, unspecified trimester: Secondary | ICD-10-CM

## 2023-05-11 DIAGNOSIS — Z3A30 30 weeks gestation of pregnancy: Secondary | ICD-10-CM | POA: Insufficient documentation

## 2023-05-11 DIAGNOSIS — F17210 Nicotine dependence, cigarettes, uncomplicated: Secondary | ICD-10-CM | POA: Diagnosis not present

## 2023-05-11 DIAGNOSIS — O35HXX0 Maternal care for other (suspected) fetal abnormality and damage, fetal lower extremities anomalies, not applicable or unspecified: Secondary | ICD-10-CM | POA: Diagnosis not present

## 2023-05-11 DIAGNOSIS — O365930 Maternal care for other known or suspected poor fetal growth, third trimester, not applicable or unspecified: Secondary | ICD-10-CM | POA: Diagnosis not present

## 2023-05-11 DIAGNOSIS — O281 Abnormal biochemical finding on antenatal screening of mother: Secondary | ICD-10-CM

## 2023-05-11 DIAGNOSIS — O99333 Smoking (tobacco) complicating pregnancy, third trimester: Secondary | ICD-10-CM

## 2023-05-11 DIAGNOSIS — O365990 Maternal care for other known or suspected poor fetal growth, unspecified trimester, not applicable or unspecified: Secondary | ICD-10-CM

## 2023-05-11 DIAGNOSIS — F1190 Opioid use, unspecified, uncomplicated: Secondary | ICD-10-CM | POA: Diagnosis not present

## 2023-05-11 DIAGNOSIS — O24410 Gestational diabetes mellitus in pregnancy, diet controlled: Secondary | ICD-10-CM

## 2023-05-11 NOTE — Progress Notes (Signed)
HIGH-RISK PREGNANCY VISIT Patient name: Rebekah [name] MRN [id number]  Date of birth: 1991-10-18 Chief Complaint:   Routine Prenatal Visit  History of Present Illness:   Rebekah [name] is a 32 y.o. G92P2002 female at [redacted]w[redacted]d with an Estimated Date of Delivery: 07/18/23 being seen today for ongoing management of a high-risk pregnancy complicated by diabetes mellitus A1DM + FGR 2.7%^UAD96% + recent shingles outbreak with reoslution  Today she reports pelvic pressure, her shingles pain is largely resolved Contractions: Not present. Vag. Bleeding: None.  Movement: Present. denies leaking of fluid.      01/14/2023    8:10 AM  Depression screen PHQ 2/9  Decreased Interest 2  Down, Depressed, Hopeless 2  PHQ - 2 Score 4  Altered sleeping 2  Tired, decreased energy 2  Change in appetite 1  Feeling bad or failure about yourself  2  Trouble concentrating 2  Moving slowly or fidgety/restless 1  Suicidal thoughts 0  PHQ-9 Score 14        01/14/2023    8:10 AM  GAD 7 : Generalized Anxiety Score  Nervous, Anxious, on Edge 2  Control/stop worrying 2  Worry too much - different things 2  Trouble relaxing 2  Restless 1  Easily annoyed or irritable 3  Afraid - awful might happen 1  Total GAD 7 Score 13     Review of Systems:   Pertinent items are noted in HPI Denies abnormal vaginal discharge w/ itching/odor/irritation, headaches, visual changes, shortness of breath, chest pain, abdominal pain, severe nausea/vomiting, or problems with urination or bowel movements unless otherwise stated above. Pertinent History Reviewed:  Reviewed past medical,surgical, social, obstetrical and family history.  Reviewed problem list, medications and allergies. Physical Assessment:   Vitals:   05/11/23 1026  BP: 135/86  Pulse: 74  Weight: 140 lb (63.5 kg)  Body mass index is 25.61 kg/m.           Physical Examination:   General appearance: alert, well appearing, and in no distress  Mental  status: alert, oriented to person, place, and time  Skin: warm & dry   Extremities:      Cardiovascular: normal heart rate noted  Respiratory: normal respiratory effort, no distress  Abdomen: gravid, soft, non-tender  Pelvic: Cervical exam deferred         Fetal Status:     Movement: Present    Fetal Surveillance Testing today: getting a BPP at M[facility], efforting to have her NST and sonograms changed back to here due to convenience and ease of clinical decision making   Chaperone:     No results found for this or any previous visit (from the past 24 hour(s)).  Assessment & Plan:  High-risk pregnancy: [id number] at [redacted]w[redacted]d with an Estimated Date of Delivery: 07/18/23      ICD-10-CM   1. Supervision of high risk pregnancy, antepartum  O09.90     2. Fetal growth restriction antepartum: 2.7% with ^UAD 96%  O36.5990     3. A1 DM  O24.410    well controlled    4. Club foot of fetus, unilateral, left sided  O35.HXX0       Meds: No orders of the defined types were placed in this encounter.   Orders: No orders of the defined types were placed in this encounter.    Labs/procedures today: BPP later today  Treatment Plan:  trying to get fetal surveillance scheduled   Follow-up: Return in about 1 week (around 05/18/2023) for  Needs HROB + BPP with alt NST.   Future Appointments  Date Time Provider Department Center  05/11/2023  2:30 PM [facility] NURSE [facility]  05/11/2023  2:45 PM [facility] US5 [facility]  05/11/2023  3:15 PM [facility] NST [facility]  05/17/2023  1:30 PM [facility] [place] [facility] None  05/26/2023  8:30 AM [facility] NURSE [facility]  05/26/2023  8:45 AM [facility] US7 [facility]    No orders of the defined types were placed in this encounter.  Lazaro Arms  Attending Physician for the Center for [facility] 05/11/2023 11:03 AM

## 2023-05-11 NOTE — Procedures (Signed)
Rebekah [name] 02/23/1991 [redacted]w[redacted]d  Fetus A Non-Stress Test Interpretation for 05/11/23  Indication:  methadone, club foot, tobacco use  Fetal Heart Rate A Mode: External Baseline Rate (A): 130 bpm Variability: Moderate, Minimal Accelerations: 15 x 15 Decelerations: Variable Multiple birth?: No  Uterine Activity Mode: Toco Contraction Frequency (min): 8-10 Contraction Duration (sec): 50-70 Contraction Quality: Mild Resting Tone Palpated: Relaxed  Interpretation (Fetal Testing) Nonstress Test Interpretation: Reactive Overall Impression: Reassuring for gestational age Comments: tracing reviewed byDr. Judeth Cornfield

## 2023-05-13 ENCOUNTER — Other Ambulatory Visit: Payer: Self-pay | Admitting: Obstetrics & Gynecology

## 2023-05-13 ENCOUNTER — Other Ambulatory Visit: Payer: Medicaid Other

## 2023-05-13 ENCOUNTER — Ambulatory Visit: Payer: Medicaid Other

## 2023-05-13 DIAGNOSIS — O365990 Maternal care for other known or suspected poor fetal growth, unspecified trimester, not applicable or unspecified: Secondary | ICD-10-CM

## 2023-05-13 DIAGNOSIS — O24410 Gestational diabetes mellitus in pregnancy, diet controlled: Secondary | ICD-10-CM

## 2023-05-13 DIAGNOSIS — O35HXX0 Maternal care for other (suspected) fetal abnormality and damage, fetal lower extremities anomalies, not applicable or unspecified: Secondary | ICD-10-CM

## 2023-05-14 ENCOUNTER — Other Ambulatory Visit: Payer: Self-pay | Admitting: Obstetrics & Gynecology

## 2023-05-14 DIAGNOSIS — O365990 Maternal care for other known or suspected poor fetal growth, unspecified trimester, not applicable or unspecified: Secondary | ICD-10-CM

## 2023-05-15 ENCOUNTER — Telehealth: Payer: Self-pay | Admitting: Pediatrics

## 2023-05-15 NOTE — Telephone Encounter (Signed)
Initial NAS telephone consult complete.  Mom reports shingles x 2 weeks, managed with gabapentin and feeling much better.  She feels well on current methadone dose; on methadone with birth of her second child.  Eat, Sleep Console reviewed and all questions answered.  Mom desires to breast feed and change pediatricians to a [employer]. She is receptive to monthly consults and has our contact information [phone].  Will follow again on 6/21.

## 2023-05-17 ENCOUNTER — Encounter: Payer: Medicaid Other | Admitting: Obstetrics & Gynecology

## 2023-05-17 ENCOUNTER — Other Ambulatory Visit: Payer: Medicaid Other

## 2023-05-19 ENCOUNTER — Ambulatory Visit: Payer: Medicaid Other

## 2023-05-25 ENCOUNTER — Ambulatory Visit: Payer: Medicaid Other

## 2023-05-25 ENCOUNTER — Encounter: Payer: Medicaid Other | Admitting: Obstetrics & Gynecology

## 2023-05-25 ENCOUNTER — Other Ambulatory Visit: Payer: Medicaid Other

## 2023-05-26 ENCOUNTER — Ambulatory Visit: Payer: Medicaid Other | Attending: Obstetrics

## 2023-05-26 ENCOUNTER — Other Ambulatory Visit: Payer: Medicaid Other

## 2023-05-26 ENCOUNTER — Ambulatory Visit: Payer: Medicaid Other

## 2023-05-31 ENCOUNTER — Ambulatory Visit (INDEPENDENT_AMBULATORY_CARE_PROVIDER_SITE_OTHER): Payer: Medicaid Other | Admitting: *Deleted

## 2023-05-31 VITALS — BP 130/83 | HR 104

## 2023-05-31 DIAGNOSIS — O365930 Maternal care for other known or suspected poor fetal growth, third trimester, not applicable or unspecified: Secondary | ICD-10-CM

## 2023-05-31 DIAGNOSIS — O0993 Supervision of high risk pregnancy, unspecified, third trimester: Secondary | ICD-10-CM

## 2023-05-31 DIAGNOSIS — O0990 Supervision of high risk pregnancy, unspecified, unspecified trimester: Secondary | ICD-10-CM

## 2023-05-31 DIAGNOSIS — Z3A33 33 weeks gestation of pregnancy: Secondary | ICD-10-CM | POA: Diagnosis not present

## 2023-05-31 NOTE — Progress Notes (Signed)
   NURSE VISIT- NST  SUBJECTIVE:  Rebekah [name] is a 32 y.o. G51P2002 female at [redacted]w[redacted]d, here for a NST for pregnancy complicated by Elevated dopplers and FGR.  She reports active fetal movement, contractions: none, vaginal bleeding: none, membranes: intact.   OBJECTIVE:  BP 130/83   Pulse (!) 104   LMP 10/22/2022   Appears well, no apparent distress  No results found for this or any previous visit (from the past 24 hour(s)).  NST: FHR baseline 130 bpm, Variability: moderate, Accelerations:present, Decelerations:  Absent= Cat 1/reactive Toco: none   ASSESSMENT: G4P2002 at [redacted]w[redacted]d with Elevated dopplers and FGR NST reactive  PLAN: EFM strip reviewed by Rebekah [name]   Recommendations: keep next appointment as scheduled    Jobe Marker  05/31/2023 4:12 PM

## 2023-06-02 ENCOUNTER — Other Ambulatory Visit: Payer: Self-pay | Admitting: Obstetrics & Gynecology

## 2023-06-02 ENCOUNTER — Other Ambulatory Visit: Payer: Medicaid Other

## 2023-06-02 ENCOUNTER — Ambulatory Visit: Payer: Medicaid Other

## 2023-06-02 DIAGNOSIS — O365930 Maternal care for other known or suspected poor fetal growth, third trimester, not applicable or unspecified: Secondary | ICD-10-CM

## 2023-06-03 ENCOUNTER — Other Ambulatory Visit: Payer: Medicaid Other

## 2023-06-03 ENCOUNTER — Encounter: Payer: Medicaid Other | Admitting: Obstetrics & Gynecology

## 2023-06-07 ENCOUNTER — Other Ambulatory Visit: Payer: Medicaid Other

## 2023-06-09 ENCOUNTER — Ambulatory Visit: Payer: Medicaid Other | Attending: Obstetrics & Gynecology

## 2023-06-09 ENCOUNTER — Ambulatory Visit: Payer: Medicaid Other

## 2023-06-09 ENCOUNTER — Other Ambulatory Visit: Payer: Medicaid Other

## 2023-06-14 ENCOUNTER — Other Ambulatory Visit (HOSPITAL_COMMUNITY)
Admission: RE | Admit: 2023-06-14 | Discharge: 2023-06-14 | Disposition: A | Discharge status: Home / Self Care | Payer: Medicaid Other | Source: Ambulatory Visit | Attending: Obstetrics & Gynecology | Admitting: Obstetrics & Gynecology

## 2023-06-14 ENCOUNTER — Encounter (HOSPITAL_COMMUNITY): Payer: Self-pay | Admitting: Obstetrics and Gynecology

## 2023-06-14 ENCOUNTER — Ambulatory Visit (INDEPENDENT_AMBULATORY_CARE_PROVIDER_SITE_OTHER): Payer: Medicaid Other | Admitting: Obstetrics and Gynecology

## 2023-06-14 ENCOUNTER — Ambulatory Visit: Payer: Medicaid Other | Admitting: *Deleted

## 2023-06-14 ENCOUNTER — Inpatient Hospital Stay (HOSPITAL_COMMUNITY)
Admission: AD | Admit: 2023-06-14 | Discharge: 2023-06-14 | Disposition: A | Discharge status: Home / Self Care | Payer: Medicaid Other | Attending: Obstetrics and Gynecology | Admitting: Obstetrics and Gynecology

## 2023-06-14 ENCOUNTER — Encounter: Payer: Self-pay | Admitting: Obstetrics and Gynecology

## 2023-06-14 ENCOUNTER — Inpatient Hospital Stay (HOSPITAL_BASED_OUTPATIENT_CLINIC_OR_DEPARTMENT_OTHER): Payer: Medicaid Other

## 2023-06-14 VITALS — BP 128/88 | HR 114 | Wt 148.0 lb

## 2023-06-14 DIAGNOSIS — O99323 Drug use complicating pregnancy, third trimester: Secondary | ICD-10-CM | POA: Diagnosis not present

## 2023-06-14 DIAGNOSIS — O0993 Supervision of high risk pregnancy, unspecified, third trimester: Secondary | ICD-10-CM

## 2023-06-14 DIAGNOSIS — O0990 Supervision of high risk pregnancy, unspecified, unspecified trimester: Secondary | ICD-10-CM | POA: Insufficient documentation

## 2023-06-14 DIAGNOSIS — O365930 Maternal care for other known or suspected poor fetal growth, third trimester, not applicable or unspecified: Secondary | ICD-10-CM | POA: Insufficient documentation

## 2023-06-14 DIAGNOSIS — O359XX0 Maternal care for (suspected) fetal abnormality and damage, unspecified, not applicable or unspecified: Secondary | ICD-10-CM

## 2023-06-14 DIAGNOSIS — O35HXX0 Maternal care for other (suspected) fetal abnormality and damage, fetal lower extremities anomalies, not applicable or unspecified: Secondary | ICD-10-CM | POA: Diagnosis not present

## 2023-06-14 DIAGNOSIS — Z3A35 35 weeks gestation of pregnancy: Secondary | ICD-10-CM | POA: Insufficient documentation

## 2023-06-14 DIAGNOSIS — O99333 Smoking (tobacco) complicating pregnancy, third trimester: Secondary | ICD-10-CM | POA: Diagnosis not present

## 2023-06-14 DIAGNOSIS — O365990 Maternal care for other known or suspected poor fetal growth, unspecified trimester, not applicable or unspecified: Secondary | ICD-10-CM

## 2023-06-14 DIAGNOSIS — F17210 Nicotine dependence, cigarettes, uncomplicated: Secondary | ICD-10-CM | POA: Diagnosis not present

## 2023-06-14 DIAGNOSIS — O09293 Supervision of pregnancy with other poor reproductive or obstetric history, third trimester: Secondary | ICD-10-CM | POA: Diagnosis present

## 2023-06-14 DIAGNOSIS — O368330 Maternal care for abnormalities of the fetal heart rate or rhythm, third trimester, not applicable or unspecified: Secondary | ICD-10-CM | POA: Diagnosis not present

## 2023-06-14 DIAGNOSIS — F1190 Opioid use, unspecified, uncomplicated: Secondary | ICD-10-CM

## 2023-06-14 DIAGNOSIS — O358XX0 Maternal care for other (suspected) fetal abnormality and damage, not applicable or unspecified: Secondary | ICD-10-CM | POA: Diagnosis not present

## 2023-06-14 LAB — URINALYSIS, ROUTINE W REFLEX MICROSCOPIC
Bilirubin Urine: NEGATIVE
Glucose, UA: NEGATIVE mg/dL
Hgb urine dipstick: NEGATIVE
Ketones, ur: NEGATIVE mg/dL
Nitrite: NEGATIVE
Protein, ur: NEGATIVE mg/dL
Specific Gravity, Urine: 1.006 (ref 1.005–1.030)
pH: 6 (ref 5.0–8.0)

## 2023-06-14 NOTE — Discharge Instructions (Signed)
Baby looks good today, still measuring small, but a better number than your last scan.  Please make sure to follow up with office for your BPP testing every week in the office, so we can make sure your baby is doing well.  Also follow up with your routine OB appointments.

## 2023-06-14 NOTE — MAU Provider Note (Signed)
History     [id number]  Arrival date and time: 06/14/23 1653    Chief Complaint  Patient presents with   non reactive fetal tracing    HPI Rebekah [name] is a 32 y.o. at [redacted]w[redacted]d by 9 week [place] with PMHx notable for severe FGR, who presents for growth [place] and BPP after a non-reactive NST in the office today.  Last growth scan on 4/30 showed severe FGR with EFW 2.7 percentile and elevated umbilical artery Dopplers.  However, she has not followed up repeat Growth scans and BPPs. She had a non reactive NST in the office to day and was sent in for evaluation due to unavailability of [place] openings for a BPP in office.  Vaginal bleeding: No LOF: No Fetal Movement: Yes Contractions: No  O/Positive/-- (01/18 1016)  OB History     Gravida  3   Para  2   Term  2   Preterm      AB      Living  2      SAB      IAB      Ectopic      Multiple      Live Births  2           Past Medical History:  Diagnosis Date   Chronic back pain    Chronic back pain    Chronic pelvic pain in female    Chronic pelvic pain in female    Contraceptive management 01/30/2015   History of retained placenta in prior pregnancy, currently pregnant 01/14/2023   Didn't hemorrhage, removed in OR ([place], [place])   Implanon removal 01/30/2015   Nausea and vomiting during pregnancy 03/06/2015   Opioid use disorder    Ovarian cyst    Pregnant 03/06/2015   Vaginal Pap smear, abnormal     Past Surgical History:  Procedure Laterality Date   CHOLECYSTECTOMY  07/31/2011   Procedure: LAPAROSCOPIC CHOLECYSTECTOMY;  Surgeon: Fabio Bering;  Location: [facility];  Service: General;  Laterality: N/A;   placenta removed  11/08/15    Family History  Problem Relation Age of Onset   Cancer Mother        cervical   Hypertension Mother    Diabetes Mother        pre-diabetes   Cancer Maternal Aunt        cervical   Cancer Maternal Grandmother        cervical   Heart disease Maternal Grandfather     Hypertension Maternal Grandfather    Diabetes Maternal Grandfather    Hyperlipidemia Maternal Grandfather    Cancer Paternal Grandmother        cervial, breast   Diabetes Paternal Grandmother    Hypertension Paternal Grandmother     Social History   Socioeconomic History   Marital status: Single    Spouse name: Not on file   Number of children: Not on file   Years of education: Not on file   Highest education level: Not on file  Occupational History   Not on file  Tobacco Use   Smoking status: Every Day    Packs/day: 1.00    Years: 8.00    Additional pack years: 0.00    Total pack years: 8.00    Types: Cigarettes   Smokeless tobacco: Never  Vaping Use   Vaping Use: Former  Substance and Sexual Activity   Alcohol use: No    Alcohol/week: 1.0 standard drink of alcohol  Types: 1 Cans of beer per week    Comment: not now   Drug use: Not Currently    Types: Hydrocodone    Comment: Currently on Methadone   Sexual activity: Not Currently    Birth control/protection: None  Other Topics Concern   Not on file  Social History Narrative   Not on file   Social Determinants of Health   Financial Resource Strain: Medium Risk (12/18/2022)   Overall Financial Resource Strain (CARDIA)    Difficulty of Paying Living Expenses: Somewhat hard  Food Insecurity: Food Insecurity Present (12/18/2022)   Hunger Vital Sign    Worried About Running Out of Food in the Last Year: Sometimes true    Ran Out of Food in the Last Year: Sometimes true  Transportation Needs: No Transportation Needs (12/18/2022)   PRAPARE - Administrator, Civil Service (Medical): No    Lack of Transportation (Non-Medical): No  Physical Activity: Inactive (12/18/2022)   Exercise Vital Sign    Days of Exercise per Week: 0 days    Minutes of Exercise per Session: 0 min  Stress: Stress Concern Present (12/18/2022)   [employer] of Occupational Health - Occupational Stress Questionnaire     Feeling of Stress : To some extent  Social Connections: Moderately Isolated (12/18/2022)   Social Connection and Isolation Panel [NHANES]    Frequency of Communication with Friends and Family: More than three times a week    Frequency of Social Gatherings with Friends and Family: Twice a week    Attends Religious Services: Never    Database administrator or Organizations: No    Attends Engineer, structural: Not on file    Marital Status: Living with partner  Intimate Partner Violence: Not At Risk (12/18/2022)   Humiliation, Afraid, Rape, and Kick questionnaire    Fear of Current or Ex-Partner: No    Emotionally Abused: No    Physically Abused: No    Sexually Abused: No    No Known Allergies  Current Facility-Administered Medications on File Prior to Encounter  Medication Dose Route Frequency Provider Last Rate Last Admin   sodium chloride 0.9 % irrigation    PRN Tilford Pillar, MD   500 mL at 07/31/11 1100   Current Outpatient Medications on File Prior to Encounter  Medication Sig Dispense Refill   methadone (DOLOPHINE) 10 MG tablet Take 100 mg by mouth daily.     omeprazole (PRILOSEC OTC) 20 MG tablet Take 1 tablet (20 mg total) by mouth daily. 30 tablet 4   promethazine (PHENERGAN) 12.5 MG tablet TAKE ONE TABLET BY MOUTH EVERY 6 HOURS AS NEEDED for nausea and vomiting 30 tablet 6   Accu-Chek Softclix Lancets lancets Use as instructed to check blood sugar 4 times daily for 2 weeks 50 each 1   blood glucose meter kit and supplies Check blood sugar 4 times daily 1 each 0   gabapentin (NEURONTIN) 300 MG capsule Take 1 capsule (300 mg total) by mouth 3 (three) times daily. (Patient not taking: Reported on 06/14/2023) 60 capsule 0   glucose blood (ACCU-CHEK GUIDE) test strip Use as instructed to check blood sugar 4 times daily for 2 weeks 50 each 1   Prenatal Vit-Fe Fumarate-FA (PRENATAL VITAMIN PO) Take by mouth.       Review of Systems  Constitutional:  Negative for chills  and fever.  Eyes:  Negative for blurred vision.  Cardiovascular:  Negative for leg swelling.  Gastrointestinal:  Negative for  abdominal pain and vomiting.  Genitourinary:  Negative for dysuria, frequency and urgency.  Musculoskeletal:  Negative for myalgias.  Skin:  Negative for itching.  Neurological:  Negative for dizziness.   Pertinent positives and negative per HPI, all others reviewed and negative  Physical Exam   BP 130/84 (BP Location: Right Arm)   Pulse 83   Temp 98.2 F (36.8 C) (Oral)   Resp 17   Ht 5\' 2"  (1.575 m)   Wt 66.9 kg   LMP 10/22/2022   SpO2 99%   BMI 26.98 kg/m   Patient Vitals for the past 24 hrs:  BP Temp Temp src Pulse Resp SpO2 Height Weight  06/14/23 2006 130/84 -- -- 83 17 99 % -- --  06/14/23 1723 124/77 -- -- 91 -- -- -- --  06/14/23 1708 126/79 98.2 F (36.8 C) Oral (!) 102 17 99 % 5\' 2"  (1.575 m) 66.9 kg    Physical Exam Vitals reviewed.  Constitutional:      General: She is not in acute distress.    Appearance: She is well-developed. She is not toxic-appearing.  HENT:     Head: Normocephalic and atraumatic.     Mouth/Throat:     Mouth: Mucous membranes are moist.  Eyes:     Extraocular Movements: Extraocular movements intact.  Cardiovascular:     Rate and Rhythm: Normal rate.  Pulmonary:     Effort: Pulmonary effort is normal. No respiratory distress.  Abdominal:     Palpations: Abdomen is soft.  Skin:    General: Skin is warm and dry.  Neurological:     Mental Status: She is alert and oriented to person, place, and time.  Psychiatric:        Mood and Affect: Mood normal.        Behavior: Behavior normal.   FHT Baseline: 135bpm bpm Variability: moderate Accelerations: >2 15 X 15 accels Decelerations: Absent Uterine activity: intermittent mild contractions every 6-10 mins  Reactive NST  Labs Results for orders placed or performed during the hospital encounter of 06/14/23 (from the past 24 hour(s))  Urinalysis, Routine  w reflex microscopic -Urine, Clean Catch     Status: Abnormal   Collection Time: 06/14/23  5:30 PM  Result Value Ref Range   Color, Urine STRAW (A) YELLOW   APPearance CLEAR CLEAR   Specific Gravity, Urine 1.006 1.005 - 1.030   pH 6.0 5.0 - 8.0   Glucose, UA NEGATIVE NEGATIVE mg/dL   Hgb urine dipstick NEGATIVE NEGATIVE   Bilirubin Urine NEGATIVE NEGATIVE   Ketones, ur NEGATIVE NEGATIVE mg/dL   Protein, ur NEGATIVE NEGATIVE mg/dL   Nitrite NEGATIVE NEGATIVE   Leukocytes,Ua TRACE (A) NEGATIVE   RBC / HPF 0-5 0 - 5 RBC/hpf   WBC, UA 0-5 0 - 5 WBC/hpf   Bacteria, UA RARE (A) NONE SEEN   Squamous Epithelial / HPF 0-5 0 - 5 /HPF   Mucus PRESENT     Imaging No results found.  MAU Course  Procedures Lab Orders         Urinalysis, Routine w reflex microscopic -Urine, Clean Catch    No orders of the defined types were placed in this encounter.  Imaging Orders         [place] MFM OB FOLLOW UP         [place] MFM FETAL BPP WO NON STRESS         [place] MFM UA CORD DOPPLER     MDM Moderate (Level 3-4)  Assessment and Plan  #Fetal growth restriction antepartum  [redacted] weeks gestation of pregnancy  NST reactive.  BPP 8/8 Growth scan shows fetus in 6th %ile, with normal umbilical artery dopplers Overall reassuring Counseled on need to follow up for scheduled weekly appointments and testing.  Dispo: discharged to home in stable condition   Allergies as of 06/14/2023   No Known Allergies      Medication List     TAKE these medications    Accu-Chek Guide test strip Generic drug: glucose blood Use as instructed to check blood sugar 4 times daily for 2 weeks   Accu-Chek Softclix Lancets lancets Use as instructed to check blood sugar 4 times daily for 2 weeks   blood glucose meter kit and supplies Check blood sugar 4 times daily   gabapentin 300 MG capsule Commonly known as: Neurontin Take 1 capsule (300 mg total) by mouth 3 (three) times daily.   methadone 10 MG tablet Commonly  known as: DOLOPHINE Take 100 mg by mouth daily.   omeprazole 20 MG tablet Commonly known as: PriLOSEC OTC Take 1 tablet (20 mg total) by mouth daily.   PRENATAL VITAMIN PO Take by mouth.   promethazine 12.5 MG tablet Commonly known as: PHENERGAN TAKE ONE TABLET BY MOUTH EVERY 6 HOURS AS NEEDED for nausea and vomiting        Dezmen Alcock MD MPH OB Fellow, Faculty Practice [facility], Center for [facility] 06/14/2023

## 2023-06-14 NOTE — Progress Notes (Signed)
Subjective:  Rebekah [name] is a 32 y.o. G3P2002 at [redacted]w[redacted]d being seen today for ongoing prenatal care.  She is currently monitored for the following issues for this high-risk pregnancy and has Abnormal Pap smear of cervix; Supervision of high risk pregnancy, antepartum; Substance use disorder; Fetal abnormality affecting management of mother; and IUGR (intrauterine growth restriction) affecting care of mother on their problem list.  Patient reports  general discomforts of pregnancy .   .  .   . Denies leaking of fluid.   The following portions of the patient's history were reviewed and updated as appropriate: allergies, current medications, past family history, past medical history, past social history, past surgical history and problem list. Problem list updated.  Objective:  There were no vitals filed for this visit.  Fetal Status:           General:  Alert, oriented and cooperative. Patient is in no acute distress.  Skin: Skin is warm and dry. No rash noted.   Cardiovascular: Normal heart rate noted  Respiratory: Normal respiratory effort, no problems with respiration noted  Abdomen: Soft, gravid, appropriate for gestational age.       Pelvic:  Cervical exam performed        Extremities: Normal range of motion.     Mental Status: Normal mood and affect. Normal behavior. Normal judgment and thought content.   Urinalysis:      Assessment and Plan:  Pregnancy: G3P2002 at [redacted]w[redacted]d  1. Supervision of high risk pregnancy, antepartum GBS and vaginal cultures obtained today  2. Poor fetal growth affecting management of mother in third trimester, single or unspecified fetus NST, baseline, 130's, not reactive, no decels, no utx ctx. Will send to MAU for BPP, unable to complete in office today MAU notified. Discussed with pt pending results IOL may be recommended Pt is scheduled for MFM U/S on Wednesday  3. Fetal abnormality affecting management of mother, single or unspecified fetus Left  Club foot.  4. H/O Substance abuse     Stable     Continue with Methadone    Preterm labor symptoms and general obstetric precautions including but not limited to vaginal bleeding, contractions, leaking of fluid and fetal movement were reviewed in detail with the patient. Please refer to After Visit Summary for other counseling recommendations.  Return in about 1 week (around 06/21/2023) for OB visit, face to face, MD only.   Hermina Staggers, MD

## 2023-06-14 NOTE — MAU Note (Signed)
Rebekah [name] is a 32 y.o. at [redacted]w[redacted]d here in MAU reporting: went to dr's for stress test, dr came in and told her she needed to come here.  Not sure what is going on. No pain, no bleeding, no LOF.  Reports baby is moving a lot.   Onset of complaint: sent from dr, pt unsure? Pain score: none Vitals:   06/14/23 1708  BP: 126/79  Pulse: (!) 102  Resp: 17  Temp: 98.2 F (36.8 C)  SpO2: 99%     FHT:148 Lab orders placed from triage:  urine

## 2023-06-16 ENCOUNTER — Ambulatory Visit: Payer: Medicaid Other | Attending: Obstetrics

## 2023-06-16 ENCOUNTER — Ambulatory Visit: Payer: Medicaid Other

## 2023-06-16 ENCOUNTER — Encounter: Payer: Self-pay | Admitting: Obstetrics and Gynecology

## 2023-06-16 LAB — CERVICOVAGINAL ANCILLARY ONLY
Chlamydia: NEGATIVE
Comment: NEGATIVE
Comment: NORMAL
Neisseria Gonorrhea: NEGATIVE

## 2023-06-17 ENCOUNTER — Ambulatory Visit (INDEPENDENT_AMBULATORY_CARE_PROVIDER_SITE_OTHER): Payer: Medicaid Other | Admitting: *Deleted

## 2023-06-17 VITALS — BP 124/84 | HR 87

## 2023-06-17 DIAGNOSIS — Z3A35 35 weeks gestation of pregnancy: Secondary | ICD-10-CM

## 2023-06-17 DIAGNOSIS — O365930 Maternal care for other known or suspected poor fetal growth, third trimester, not applicable or unspecified: Secondary | ICD-10-CM | POA: Diagnosis not present

## 2023-06-17 NOTE — Progress Notes (Signed)
   NURSE VISIT- NST  SUBJECTIVE:  Rebekah [name] is a 32 y.o. G14P2002 female at [redacted]w[redacted]d, here for a NST for pregnancy complicated by FGR.  She reports decreased fetal movement, states she has not eaten anything this morning,contractions: none, vaginal bleeding: none, membranes: intact.   OBJECTIVE:  BP 124/84   Pulse 87   LMP 10/22/2022   Appears well, no apparent distress  No results found for this or any previous visit (from the past 24 hour(s)).  NST: FHR baseline 125 bpm, Variability: moderate, Accelerations:present, Decelerations:  Absent= Cat 1/reactive Toco: none   ASSESSMENT: G3P2002 at 109w4d with FGR NST reactive  PLAN: EFM strip reviewed by Dr. Charlotta Newton   Recommendations: keep next appointment as scheduled    Jobe Marker  06/17/2023 11:03 AM

## 2023-06-18 LAB — CULTURE, BETA STREP (GROUP B ONLY): Strep Gp B Culture: NEGATIVE

## 2023-06-20 ENCOUNTER — Telehealth: Payer: Self-pay | Admitting: Pediatrics

## 2023-06-20 NOTE — Telephone Encounter (Signed)
NAS telephone consult attempted without answer.  HIPAA compliant message left requesting a return call or text. Will follow again on on 7/19.

## 2023-06-21 ENCOUNTER — Encounter: Payer: Self-pay | Admitting: Women's Health

## 2023-06-21 ENCOUNTER — Ambulatory Visit (INDEPENDENT_AMBULATORY_CARE_PROVIDER_SITE_OTHER): Payer: Medicaid Other | Admitting: Women's Health

## 2023-06-21 ENCOUNTER — Telehealth (HOSPITAL_COMMUNITY): Payer: Self-pay | Admitting: *Deleted

## 2023-06-21 ENCOUNTER — Other Ambulatory Visit: Payer: Medicaid Other

## 2023-06-21 ENCOUNTER — Encounter (HOSPITAL_COMMUNITY): Payer: Self-pay | Admitting: *Deleted

## 2023-06-21 VITALS — BP 130/84 | HR 84 | Wt 146.6 lb

## 2023-06-21 DIAGNOSIS — Z3A36 36 weeks gestation of pregnancy: Secondary | ICD-10-CM

## 2023-06-21 DIAGNOSIS — O0993 Supervision of high risk pregnancy, unspecified, third trimester: Secondary | ICD-10-CM

## 2023-06-21 DIAGNOSIS — O365930 Maternal care for other known or suspected poor fetal growth, third trimester, not applicable or unspecified: Secondary | ICD-10-CM

## 2023-06-21 DIAGNOSIS — O99323 Drug use complicating pregnancy, third trimester: Secondary | ICD-10-CM | POA: Diagnosis not present

## 2023-06-21 DIAGNOSIS — F1120 Opioid dependence, uncomplicated: Secondary | ICD-10-CM

## 2023-06-21 DIAGNOSIS — O35HXX0 Maternal care for other (suspected) fetal abnormality and damage, fetal lower extremities anomalies, not applicable or unspecified: Secondary | ICD-10-CM

## 2023-06-21 NOTE — Telephone Encounter (Signed)
Preadmission screen  

## 2023-06-21 NOTE — Progress Notes (Signed)
HIGH-RISK PREGNANCY VISIT Patient name: Rebekah [name] MRN [id number]  Date of birth: [birth year] Chief Complaint:   Non-stress Test and Routine Prenatal Visit  History of Present Illness:   Rebekah [name] is a 32 y.o. G31P2002 female at [redacted]w[redacted]d with an Estimated Date of Delivery: 07/18/23 being seen today for ongoing management of a high-risk pregnancy complicated by fetal growth restriction 6% w/ normal UAD and methadone use (stable).    Today she reports no complaints. Last time I saw her she declined GTT, wanted to check sugars QID x 2wks, reports she did, but has not brought [place] log yet. Thinks they were all wnl, but will send [place] log via MyChart today.  Contractions: Not present. Vag. Bleeding: None.  Movement: Present. denies leaking of fluid.      01/14/2023    8:10 AM  Depression screen PHQ 2/9  Decreased Interest 2  Down, Depressed, Hopeless 2  PHQ - 2 Score 4  Altered sleeping 2  Tired, decreased energy 2  Change in appetite 1  Feeling bad or failure about yourself  2  Trouble concentrating 2  Moving slowly or fidgety/restless 1  Suicidal thoughts 0  PHQ-9 Score 14        01/14/2023    8:10 AM  GAD 7 : Generalized Anxiety Score  Nervous, Anxious, on Edge 2  Control/stop worrying 2  Worry too much - different things 2  Trouble relaxing 2  Restless 1  Easily annoyed or irritable 3  Afraid - awful might happen 1  Total GAD 7 Score 13     Review of Systems:   Pertinent items are noted in HPI Denies abnormal vaginal discharge w/ itching/odor/irritation, headaches, visual changes, shortness of breath, chest pain, abdominal pain, severe nausea/vomiting, or problems with urination or bowel movements unless otherwise stated above. Pertinent History Reviewed:  Reviewed past medical,surgical, social, obstetrical and family history.  Reviewed problem list, medications and allergies. Physical Assessment:   Vitals:   06/21/23 0930  BP: 130/84  Pulse: 84  Weight: 146 lb  9.6 oz (66.5 kg)  Body mass index is 26.81 kg/m.           Physical Examination:   General appearance: alert, well appearing, and in no distress  Mental status: alert, oriented to person, place, and time  Skin: warm & dry   Extremities: Edema: None    Cardiovascular: normal heart rate noted  Respiratory: normal respiratory effort, no distress  Abdomen: gravid, soft, non-tender  Pelvic: Cervical exam deferred         Fetal Status:     Movement: Present    Fetal Surveillance Testing today: NST: FHR baseline 125 bpm, Variability: moderate, Accelerations:present, Decelerations:  Present  1 decel at 0956 that correlated w/ a long contraction, kept on x 1 full hour after, no further decels at all, Cat 1 FHR/reactive NST thereafter Toco: q 2-60mins    Chaperone: N/A    No results found for this or any previous visit (from the past 24 hour(s)).  Assessment & Plan:  High-risk pregnancy: G3P2002 at [redacted]w[redacted]d with an Estimated Date of Delivery: 07/18/23   1) FGR 6%, dx @ 28wk (2% w/ 96% UAD then), last UAD 6/17 were 71% and EFW 6%, per Dr. Rich Number note he recommends delivery around 37wks. Discussed w/ him today, d/t FGR w/ previously elevated UAD, methadone daily and smoker- recommends at 37wks. Pt agreeable w/ this plan. 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   Labs/procedures today: NST  Reviewed: Preterm labor symptoms and general obstetric precautions including but not limited to vaginal bleeding, contractions, leaking of fluid and fetal movement were reviewed in detail with the patient.  All questions were  answered. Does have home bp cuff. Office bp cuff given: not applicable. Check bp weekly, let us know if consistently >140 and/or >90.  Follow-up: Return for As scheduled 6/27, cancel everything after.   Future Appointments  Date Time Provider Department Center  06/21/2023  2:50 PM [facility] NURSE [facility]  06/24/2023 10:45 AM [facility] [place] [facility] None  06/24/2023 11:30 AM Hermina Staggers, MD [facility]  06/27/2023  7:00 AM [facility] SCHED ROOM [facility] None    No orders of the defined types were placed in this encounter.  Cheral Marker CNM, [facility] 06/21/2023 11:19 AM

## 2023-06-21 NOTE — Patient Instructions (Signed)
[place], thank you for choosing our office today! We appreciate the opportunity to meet your healthcare needs. You may receive a short survey by mail, e-mail, or through MyChart. If you are happy with your care we would appreciate if you could take just a few minutes to complete the survey questions. We read all of your comments and take your feedback very seriously. Thank you again for choosing our office.  [facility] ([address] [place], [place] [address]) Entrance C, located off of [address] Free 24/7 valet parking   CLASSES: Go to [email or web address] to register for classes (childbirth, breastfeeding, waterbirth, infant CPR, daddy bootcamp, etc.)  Call the office ([phone]) or go to [facility] if: You begin to have strong, frequent contractions Your water breaks.  Sometimes it is a big gush of fluid, sometimes it is just a trickle that keeps getting your panties wet or running down your legs You have vaginal bleeding.  It is normal to have a small amount of spotting if your cervix was checked.  You don't feel your baby moving like normal.  If you don't, get you something to eat and drink and lay down and focus on feeling your baby move.   If your baby is still not moving like normal, you should call the office or go to [facility].  Call the office ([phone]) or go to [facility] for these signs of pre-eclampsia: Severe headache that does not go away with Tylenol Visual changes- seeing spots, double, blurred vision Pain under your right breast or upper abdomen that does not go away with Tums or heartburn medicine Nausea and/or vomiting Severe swelling in your hands, feet, and face   Tdap Vaccine It is recommended that you get the Tdap vaccine during the third trimester of EACH pregnancy to help protect your baby from getting pertussis (whooping cough) 27-36 weeks is the BEST time to do  this so that you can pass the protection on to your baby. During pregnancy is better than after pregnancy, but if you are unable to get it during pregnancy it will be offered at the hospital.  You can get this vaccine with us, at the health department, your family doctor, or some local pharmacies Everyone who will be around your baby should also be up-to-date on their vaccines before the baby comes. Adults (who are not pregnant) only need 1 dose of Tdap during adulthood.   [place] Pediatricians/Family Doctors [facility] ([facility]): [address], [phone]           [facility]: [address], [phone]                [facility] ([facility]): [address], [phone] (call to ask if accepting patients) [facility]: [address], [place], [phone]    [place] Pediatricians/Family Doctors [facility] ([facility]): [address], [phone] [facility]: [address], [phone] [facility]: [address], [phone]  [place] [facility]  [facility] ([facility]): [phone] [facility]: [address], [phone]   [place] Family Doctors [facility]: [address], [phone]  [place] [facility]  [facility]: [address], [phone]  Home Blood Pressure Monitoring for Patients   Your provider has recommended that you check your   blood pressure (BP) at least once a week at home. If you do not have a blood pressure cuff at home, one will be provided for you. Contact your provider if you have not received your monitor within 1 week.   Helpful Tips for Accurate Home Blood Pressure Checks  Don't smoke, exercise, or drink caffeine 30 minutes before checking your BP Use the restroom before checking your BP (a full bladder can raise your  pressure) Relax in a comfortable upright chair Feet on the ground Left arm resting comfortably on a flat surface at the level of your heart Legs uncrossed Back supported Sit quietly and don't talk Place the cuff on your bare arm Adjust snuggly, so that only two fingertips can fit between your skin and the top of the cuff Check 2 readings separated by at least one minute Keep a log of your BP readings For a visual, please reference this diagram: http://ccnc.care/bpdiagram  Provider Name: [facility]     Phone: [phone]  Zone 1: ALL CLEAR  Continue to monitor your symptoms:  BP reading is less than 140 (top number) or less than 90 (bottom number)  No right upper stomach pain No headaches or seeing spots No feeling nauseated or throwing up No swelling in face and hands  Zone 2: CAUTION Call your doctor's office for any of the following:  BP reading is greater than 140 (top number) or greater than 90 (bottom number)  Stomach pain under your ribs in the middle or right side Headaches or seeing spots Feeling nauseated or throwing up Swelling in face and hands  Zone 3: EMERGENCY  Seek immediate medical care if you have any of the following:  BP reading is greater than160 (top number) or greater than 110 (bottom number) Severe headaches not improving with Tylenol Serious difficulty catching your breath Any worsening symptoms from Zone 2  Preterm Labor and Birth Information  The normal length of a pregnancy is 39-41 weeks. Preterm labor is when labor starts before 37 completed weeks of pregnancy. What are the risk factors for preterm labor? Preterm labor is more likely to occur in women who: Have certain infections during pregnancy such as a bladder infection, sexually transmitted infection, or infection inside the uterus (chorioamnionitis). Have a shorter-than-normal cervix. Have gone into preterm labor before. Have had surgery on their cervix. Are younger than age 17  or older than age 35. Are African American. Are pregnant with twins or multiple babies (multiple gestation). Take street drugs or smoke while pregnant. Do not gain enough weight while pregnant. Became pregnant shortly after having been pregnant. What are the symptoms of preterm labor? Symptoms of preterm labor include: Cramps similar to those that can happen during a menstrual period. The cramps may happen with diarrhea. Pain in the abdomen or lower back. Regular uterine contractions that may feel like tightening of the abdomen. A feeling of increased pressure in the pelvis. Increased watery or bloody mucus discharge from the vagina. Water breaking (ruptured amniotic sac). Why is it important to recognize signs of preterm labor? It is important to recognize signs of preterm labor because babies who are born prematurely may not be fully developed. This can put them at an increased risk for: Long-term (chronic) heart and lung problems. Difficulty immediately after birth with regulating body systems, including blood sugar, body temperature, heart rate, and breathing rate. Bleeding in the brain. Cerebral palsy. Learning difficulties. Death. These risks are highest for babies who are born before 34 weeks   of pregnancy. How is preterm labor treated? Treatment depends on the length of your pregnancy, your condition, and the health of your baby. It may involve: Having a stitch (suture) placed in your cervix to prevent your cervix from opening too early (cerclage). Taking or being given medicines, such as: Hormone medicines. These may be given early in pregnancy to help support the pregnancy. Medicine to stop contractions. Medicines to help mature the baby's lungs. These may be prescribed if the risk of delivery is high. Medicines to prevent your baby from developing cerebral palsy. If the labor happens before 34 weeks of pregnancy, you may need to stay in the hospital. What should I do if I  think I am in preterm labor? If you think that you are going into preterm labor, call your health care provider right away. How can I prevent preterm labor in future pregnancies? To increase your chance of having a full-term pregnancy: Do not use any tobacco products, such as cigarettes, chewing tobacco, and e-cigarettes. If you need help quitting, ask your health care provider. Do not use street drugs or medicines that have not been prescribed to you during your pregnancy. Talk with your health care provider before taking any herbal supplements, even if you have been taking them regularly. Make sure you gain a healthy amount of weight during your pregnancy. Watch for infection. If you think that you might have an infection, get it checked right away. Make sure to tell your health care provider if you have gone into preterm labor before. This information is not intended to replace advice given to you by your health care provider. Make sure you discuss any questions you have with your health care provider. Document Revised: 04/07/2019 Document Reviewed: 05/06/2016 Elsevier Patient Education  2020 Elsevier Inc.   

## 2023-06-22 ENCOUNTER — Other Ambulatory Visit: Payer: Medicaid Other

## 2023-06-24 ENCOUNTER — Encounter: Payer: Self-pay | Admitting: Obstetrics & Gynecology

## 2023-06-24 ENCOUNTER — Encounter: Payer: Medicaid Other | Admitting: Obstetrics and Gynecology

## 2023-06-24 ENCOUNTER — Ambulatory Visit (INDEPENDENT_AMBULATORY_CARE_PROVIDER_SITE_OTHER): Payer: Medicaid Other

## 2023-06-24 ENCOUNTER — Ambulatory Visit (INDEPENDENT_AMBULATORY_CARE_PROVIDER_SITE_OTHER): Payer: Medicaid Other | Admitting: Obstetrics & Gynecology

## 2023-06-24 VITALS — BP 115/79 | HR 85 | Wt 148.0 lb

## 2023-06-24 DIAGNOSIS — O35HXX0 Maternal care for other (suspected) fetal abnormality and damage, fetal lower extremities anomalies, not applicable or unspecified: Secondary | ICD-10-CM

## 2023-06-24 DIAGNOSIS — O365930 Maternal care for other known or suspected poor fetal growth, third trimester, not applicable or unspecified: Secondary | ICD-10-CM

## 2023-06-24 DIAGNOSIS — Z3A36 36 weeks gestation of pregnancy: Secondary | ICD-10-CM

## 2023-06-24 DIAGNOSIS — O365990 Maternal care for other known or suspected poor fetal growth, unspecified trimester, not applicable or unspecified: Secondary | ICD-10-CM | POA: Diagnosis not present

## 2023-06-24 DIAGNOSIS — F1120 Opioid dependence, uncomplicated: Secondary | ICD-10-CM

## 2023-06-24 DIAGNOSIS — O99323 Drug use complicating pregnancy, third trimester: Secondary | ICD-10-CM

## 2023-06-24 DIAGNOSIS — O24410 Gestational diabetes mellitus in pregnancy, diet controlled: Secondary | ICD-10-CM

## 2023-06-24 DIAGNOSIS — O0990 Supervision of high risk pregnancy, unspecified, unspecified trimester: Secondary | ICD-10-CM

## 2023-06-24 NOTE — Progress Notes (Signed)
[place] 36+4 wks,cephalic,FHR 135 bpm,elevated UAD with EDF,RI .70,.77,.71,.70=97%,posterior placenta gr 3,AFI 11 cm,BPP 8/8,left club foot

## 2023-06-24 NOTE — Progress Notes (Signed)
HIGH-RISK PREGNANCY VISIT Patient name: Rebekah [name] MRN [id number]  Date of birth: Jun 18, 1991 Chief Complaint:   Routine Prenatal Visit and Pregnancy Ultrasound  History of Present Illness:   Rebekah [name] is a 32 y.o. G9P2002 female at [redacted]w[redacted]d with an Estimated Date of Delivery: 07/18/23 being seen today for ongoing management of a high-risk pregnancy complicated by FGR ^UAD, OAD and left fetal clubfoot.    Today she reports no complaints. Contractions: Irregular.  .  Movement: Present. denies leaking of fluid.      01/14/2023    8:10 AM  Depression screen PHQ 2/9  Decreased Interest 2  Down, Depressed, Hopeless 2  PHQ - 2 Score 4  Altered sleeping 2  Tired, decreased energy 2  Change in appetite 1  Feeling bad or failure about yourself  2  Trouble concentrating 2  Moving slowly or fidgety/restless 1  Suicidal thoughts 0  PHQ-9 Score 14        01/14/2023    8:10 AM  GAD 7 : Generalized Anxiety Score  Nervous, Anxious, on Edge 2  Control/stop worrying 2  Worry too much - different things 2  Trouble relaxing 2  Restless 1  Easily annoyed or irritable 3  Afraid - awful might happen 1  Total GAD 7 Score 13     Review of Systems:   Pertinent items are noted in HPI Denies abnormal vaginal discharge w/ itching/odor/irritation, headaches, visual changes, shortness of breath, chest pain, abdominal pain, severe nausea/vomiting, or problems with urination or bowel movements unless otherwise stated above. Pertinent History Reviewed:  Reviewed past medical,surgical, social, obstetrical and family history.  Reviewed problem list, medications and allergies. Physical Assessment:   Vitals:   06/24/23 1133  BP: 115/79  Pulse: 85  Weight: 148 lb (67.1 kg)  Body mass index is 27.07 kg/m.           Physical Examination:   General appearance: alert, well appearing, and in no distress  Mental status: alert, oriented to person, place, and time  Skin: warm & dry    Extremities: Edema: None    Cardiovascular: normal heart rate noted  Respiratory: normal respiratory effort, no distress  Abdomen: gravid, soft, non-tender  Pelvic: Cervical exam deferred         Fetal Status:     Movement: Present    Fetal Surveillance Testing today: BPP 8/8 ^UAD 96%   Chaperone: N/A    No results found for this or any previous visit (from the past 24 hour(s)).  Assessment & Plan:  High-risk pregnancy: G3P2002 at [redacted]w[redacted]d with an Estimated Date of Delivery: 07/18/23      ICD-10-CM   1. Supervision of high risk pregnancy, antepartum  O09.90     2. Fetal growth restriction 6% UAD 96%  O36.5990     3. 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   Orders: No orders of the defined types were placed in this encounter.    Labs/procedures today: U/S  Treatment Plan:  IOL 06/27/23    Follow-up: No follow-ups on file.   Future Appointments  Date Time Provider Department Center  06/27/2023  7:00 AM [facility] SCHED ROOM [facility] None    No orders of the defined types were placed in this encounter.  Lazaro Arms  Attending Physician for the [facility] 06/24/2023 11:52 AM

## 2023-06-26 ENCOUNTER — Other Ambulatory Visit: Payer: Self-pay | Admitting: Advanced Practice Midwife

## 2023-06-27 ENCOUNTER — Other Ambulatory Visit: Payer: Self-pay

## 2023-06-27 ENCOUNTER — Encounter (HOSPITAL_COMMUNITY)
Admission: RE | Disposition: A | Discharge status: Home / Self Care | Payer: Self-pay | Source: Home / Self Care | Attending: Obstetrics and Gynecology

## 2023-06-27 ENCOUNTER — Inpatient Hospital Stay (HOSPITAL_COMMUNITY)
Admission: RE | Admit: 2023-06-27 | Discharge: 2023-06-29 | DRG: 768 | Disposition: A | Discharge status: Home / Self Care | Payer: Medicaid Other | Attending: Obstetrics and Gynecology | Admitting: Obstetrics and Gynecology

## 2023-06-27 ENCOUNTER — Encounter (HOSPITAL_COMMUNITY): Payer: Self-pay | Admitting: Obstetrics & Gynecology

## 2023-06-27 ENCOUNTER — Inpatient Hospital Stay (HOSPITAL_COMMUNITY): Payer: Medicaid Other | Admitting: Anesthesiology

## 2023-06-27 ENCOUNTER — Inpatient Hospital Stay (HOSPITAL_COMMUNITY): Payer: Medicaid Other | Admitting: Certified Registered Nurse Anesthetist

## 2023-06-27 ENCOUNTER — Inpatient Hospital Stay (HOSPITAL_COMMUNITY): Payer: Medicaid Other

## 2023-06-27 DIAGNOSIS — O9081 Anemia of the puerperium: Secondary | ICD-10-CM | POA: Diagnosis not present

## 2023-06-27 DIAGNOSIS — F17210 Nicotine dependence, cigarettes, uncomplicated: Secondary | ICD-10-CM | POA: Diagnosis present

## 2023-06-27 DIAGNOSIS — Z3A37 37 weeks gestation of pregnancy: Secondary | ICD-10-CM | POA: Diagnosis not present

## 2023-06-27 DIAGNOSIS — O0993 Supervision of high risk pregnancy, unspecified, third trimester: Secondary | ICD-10-CM

## 2023-06-27 DIAGNOSIS — Z5986 Financial insecurity: Secondary | ICD-10-CM | POA: Diagnosis not present

## 2023-06-27 DIAGNOSIS — O0990 Supervision of high risk pregnancy, unspecified, unspecified trimester: Secondary | ICD-10-CM

## 2023-06-27 DIAGNOSIS — Z8759 Personal history of other complications of pregnancy, childbirth and the puerperium: Secondary | ICD-10-CM | POA: Diagnosis present

## 2023-06-27 DIAGNOSIS — R87619 Unspecified abnormal cytological findings in specimens from cervix uteri: Secondary | ICD-10-CM | POA: Diagnosis present

## 2023-06-27 DIAGNOSIS — O99334 Smoking (tobacco) complicating childbirth: Secondary | ICD-10-CM | POA: Diagnosis present

## 2023-06-27 DIAGNOSIS — D62 Acute posthemorrhagic anemia: Secondary | ICD-10-CM | POA: Diagnosis not present

## 2023-06-27 DIAGNOSIS — O365930 Maternal care for other known or suspected poor fetal growth, third trimester, not applicable or unspecified: Principal | ICD-10-CM | POA: Diagnosis present

## 2023-06-27 DIAGNOSIS — O35HXX0 Maternal care for other (suspected) fetal abnormality and damage, fetal lower extremities anomalies, not applicable or unspecified: Secondary | ICD-10-CM | POA: Diagnosis present

## 2023-06-27 DIAGNOSIS — O99324 Drug use complicating childbirth: Secondary | ICD-10-CM | POA: Diagnosis not present

## 2023-06-27 DIAGNOSIS — O359XX0 Maternal care for (suspected) fetal abnormality and damage, unspecified, not applicable or unspecified: Secondary | ICD-10-CM | POA: Diagnosis present

## 2023-06-27 DIAGNOSIS — F1990 Other psychoactive substance use, unspecified, uncomplicated: Secondary | ICD-10-CM | POA: Diagnosis present

## 2023-06-27 DIAGNOSIS — O365990 Maternal care for other known or suspected poor fetal growth, unspecified trimester, not applicable or unspecified: Principal | ICD-10-CM | POA: Diagnosis present

## 2023-06-27 DIAGNOSIS — F1120 Opioid dependence, uncomplicated: Secondary | ICD-10-CM

## 2023-06-27 HISTORY — PX: DILATION AND CURETTAGE OF UTERUS: SHX78

## 2023-06-27 LAB — DIC (DISSEMINATED INTRAVASCULAR COAGULATION)PANEL
D-Dimer, Quant: 3.61 ug/mL-FEU — ABNORMAL HIGH (ref 0.00–0.50)
Fibrinogen: 285 mg/dL (ref 210–475)
INR: 1.2 (ref 0.8–1.2)
Platelets: 227 10*3/uL (ref 150–400)
Prothrombin Time: 15.6 seconds — ABNORMAL HIGH (ref 11.4–15.2)
Smear Review: NONE SEEN
aPTT: 33 seconds (ref 24–36)

## 2023-06-27 LAB — TYPE AND SCREEN
Antibody Screen: NEGATIVE
Unit division: 0

## 2023-06-27 LAB — CBC
HCT: 28.8 % — ABNORMAL LOW (ref 36.0–46.0)
HCT: 31.2 % — ABNORMAL LOW (ref 36.0–46.0)
HCT: 22.7 % — ABNORMAL LOW (ref 36.0–46.0)
HCT: 26.5 % — ABNORMAL LOW (ref 36.0–46.0)
Hemoglobin: 9.3 g/dL — ABNORMAL LOW (ref 12.0–15.0)
Hemoglobin: 9.9 g/dL — ABNORMAL LOW (ref 12.0–15.0)
Hemoglobin: 7.1 g/dL — ABNORMAL LOW (ref 12.0–15.0)
Hemoglobin: 8.5 g/dL — ABNORMAL LOW (ref 12.0–15.0)
MCH: 29.7 pg (ref 26.0–34.0)
MCH: 29.4 pg (ref 26.0–34.0)
MCH: 30.3 pg (ref 26.0–34.0)
MCH: 30 pg (ref 26.0–34.0)
MCHC: 32.3 g/dL (ref 30.0–36.0)
MCHC: 31.7 g/dL (ref 30.0–36.0)
MCHC: 31.3 g/dL (ref 30.0–36.0)
MCHC: 32.1 g/dL (ref 30.0–36.0)
MCV: 92 fL (ref 80.0–100.0)
MCV: 92.6 fL (ref 80.0–100.0)
MCV: 97 fL (ref 80.0–100.0)
MCV: 93.6 fL (ref 80.0–100.0)
Platelets: 184 10*3/uL (ref 150–400)
Platelets: 222 10*3/uL (ref 150–400)
Platelets: 220 10*3/uL (ref 150–400)
Platelets: 139 10*3/uL — ABNORMAL LOW (ref 150–400)
RBC: 3.13 MIL/uL — ABNORMAL LOW (ref 3.87–5.11)
RBC: 3.37 MIL/uL — ABNORMAL LOW (ref 3.87–5.11)
RBC: 2.34 MIL/uL — ABNORMAL LOW (ref 3.87–5.11)
RBC: 2.83 MIL/uL — ABNORMAL LOW (ref 3.87–5.11)
RDW: 14.2 % (ref 11.5–15.5)
RDW: 14.3 % (ref 11.5–15.5)
RDW: 14.3 % (ref 11.5–15.5)
RDW: 14.6 % (ref 11.5–15.5)
WBC: 9.4 10*3/uL (ref 4.0–10.5)
WBC: 16.3 10*3/uL — ABNORMAL HIGH (ref 4.0–10.5)
WBC: 18.6 10*3/uL — ABNORMAL HIGH (ref 4.0–10.5)
WBC: 17.8 10*3/uL — ABNORMAL HIGH (ref 4.0–10.5)
nRBC: 0.2 % (ref 0.0–0.2)
nRBC: 0.2 % (ref 0.0–0.2)
nRBC: 0.1 % (ref 0.0–0.2)
nRBC: 0 % (ref 0.0–0.2)

## 2023-06-27 LAB — RPR: RPR Ser Ql: NONREACTIVE

## 2023-06-27 LAB — BPAM RBC

## 2023-06-27 LAB — ABO/RH: ABO/RH(D): O POS

## 2023-06-27 LAB — PREPARE RBC (CROSSMATCH)

## 2023-06-27 SURGERY — Surgical Case
Anesthesia: Epidural | Site: Vagina

## 2023-06-27 SURGERY — DILATION AND CURETTAGE
Anesthesia: Epidural | Site: Vagina

## 2023-06-27 MED ORDER — TERBUTALINE SULFATE 1 MG/ML IJ SOLN: 0.2500 mg | Freq: Once | INTRAMUSCULAR | Status: DC | PRN

## 2023-06-27 MED ORDER — TRANEXAMIC ACID-NACL 1000-0.7 MG/100ML-% IV SOLN: 1000.0000 mg | INTRAVENOUS | Status: AC

## 2023-06-27 MED ORDER — TETANUS-DIPHTH-ACELL PERTUSSIS 5-2.5-18.5 LF-MCG/0.5 IM SUSY: 0.5000 mL | PREFILLED_SYRINGE | Freq: Once | INTRAMUSCULAR | Status: DC

## 2023-06-27 MED ORDER — EPHEDRINE 5 MG/ML INJ: 10.0000 mg | INTRAVENOUS | Status: DC | PRN

## 2023-06-27 MED ORDER — ONDANSETRON HCL 4 MG/2ML IJ SOLN
4.0000 mg | Freq: Four times a day (QID) | INTRAMUSCULAR | Status: DC | PRN
  Administered 2023-06-27: 4 mg via INTRAVENOUS
  Filled 2023-06-27: qty 2

## 2023-06-27 MED ORDER — TRANEXAMIC ACID-NACL 1000-0.7 MG/100ML-% IV SOLN
INTRAVENOUS | Status: DC | PRN
  Administered 2023-06-27: 1000 mg via INTRAVENOUS

## 2023-06-27 MED ORDER — DIBUCAINE (PERIANAL) 1 % EX OINT: 1.0000 | TOPICAL_OINTMENT | CUTANEOUS | Status: DC | PRN

## 2023-06-27 MED ORDER — SOD CITRATE-CITRIC ACID 500-334 MG/5ML PO SOLN
30.0000 mL | ORAL | Status: DC | PRN
  Administered 2023-06-27: 30 mL via ORAL
  Filled 2023-06-27 (×2): qty 30

## 2023-06-27 MED ORDER — TRANEXAMIC ACID-NACL 1000-0.7 MG/100ML-% IV SOLN
INTRAVENOUS | Status: AC
  Filled 2023-06-27: qty 100

## 2023-06-27 MED ORDER — LIDOCAINE-EPINEPHRINE (PF) 2 %-1:200000 IJ SOLN
INTRAMUSCULAR | Status: DC | PRN
  Administered 2023-06-27 (×2): 5 mL via EPIDURAL

## 2023-06-27 MED ORDER — METHADONE HCL 10 MG PO TABS
110.0000 mg | ORAL_TABLET | Freq: Every day | ORAL | Status: DC
  Administered 2023-06-27 – 2023-06-29 (×3): 110 mg via ORAL
  Filled 2023-06-27 (×3): qty 11

## 2023-06-27 MED ORDER — METHADONE HCL 10 MG PO TABS: 10.0000 mg | ORAL_TABLET | Freq: Every day | ORAL | Status: DC

## 2023-06-27 MED ORDER — COCONUT OIL OIL: 1.0000 | TOPICAL_OIL | Status: DC | PRN

## 2023-06-27 MED ORDER — ONDANSETRON HCL 4 MG/2ML IJ SOLN
4.0000 mg | INTRAMUSCULAR | Status: DC | PRN
  Administered 2023-06-27: 4 mg via INTRAVENOUS
  Filled 2023-06-27: qty 2

## 2023-06-27 MED ORDER — DROPERIDOL 2.5 MG/ML IJ SOLN
INTRAMUSCULAR | Status: DC | PRN
  Administered 2023-06-27: .625 mg via INTRAVENOUS

## 2023-06-27 MED ORDER — FENTANYL CITRATE (PF) 100 MCG/2ML IJ SOLN
INTRAMUSCULAR | Status: AC
  Filled 2023-06-27: qty 2

## 2023-06-27 MED ORDER — ONDANSETRON HCL 4 MG/2ML IJ SOLN
INTRAMUSCULAR | Status: AC
  Filled 2023-06-27: qty 2

## 2023-06-27 MED ORDER — ACETAMINOPHEN 10 MG/ML IV SOLN
1000.0000 mg | Freq: Once | INTRAVENOUS | Status: AC
  Administered 2023-06-27: 1000 mg via INTRAVENOUS

## 2023-06-27 MED ORDER — ACETAMINOPHEN 325 MG PO TABS: 650.0000 mg | ORAL_TABLET | ORAL | Status: DC | PRN

## 2023-06-27 MED ORDER — LIDOCAINE HCL (PF) 1 % IJ SOLN
INTRAMUSCULAR | Status: DC | PRN
  Administered 2023-06-27 (×2): 4 mL via EPIDURAL

## 2023-06-27 MED ORDER — FENTANYL CITRATE (PF) 100 MCG/2ML IJ SOLN: 100.0000 ug | INTRAMUSCULAR | Status: DC | PRN

## 2023-06-27 MED ORDER — CEFAZOLIN SODIUM-DEXTROSE 2-4 GM/100ML-% IV SOLN
2.0000 g | Freq: Once | INTRAVENOUS | Status: AC
  Administered 2023-06-27 (×2): 2 g via INTRAVENOUS

## 2023-06-27 MED ORDER — PHENYLEPHRINE 80 MCG/ML (10ML) SYRINGE FOR IV PUSH (FOR BLOOD PRESSURE SUPPORT): 80.0000 ug | PREFILLED_SYRINGE | INTRAVENOUS | Status: DC | PRN

## 2023-06-27 MED ORDER — LACTATED RINGERS AMNIOINFUSION: INTRAVENOUS | Status: DC

## 2023-06-27 MED ORDER — ONDANSETRON HCL 4 MG PO TABS: 4.0000 mg | ORAL_TABLET | ORAL | Status: DC | PRN

## 2023-06-27 MED ORDER — CEFAZOLIN SODIUM-DEXTROSE 2-4 GM/100ML-% IV SOLN
INTRAVENOUS | Status: AC
  Filled 2023-06-27: qty 100

## 2023-06-27 MED ORDER — OXYTOCIN-SODIUM CHLORIDE 30-0.9 UT/500ML-% IV SOLN
INTRAVENOUS | Status: AC
  Filled 2023-06-27: qty 500

## 2023-06-27 MED ORDER — OXYTOCIN BOLUS FROM INFUSION
333.0000 mL | Freq: Once | INTRAVENOUS | Status: AC
  Administered 2023-06-27: 333 mL via INTRAVENOUS

## 2023-06-27 MED ORDER — STERILE WATER FOR IRRIGATION IR SOLN
Status: DC | PRN
  Administered 2023-06-27: 1000 mL

## 2023-06-27 MED ORDER — DROPERIDOL 2.5 MG/ML IJ SOLN
INTRAMUSCULAR | Status: AC
  Filled 2023-06-27: qty 2

## 2023-06-27 MED ORDER — OXYTOCIN-SODIUM CHLORIDE 30-0.9 UT/500ML-% IV SOLN
1.0000 m[IU]/min | INTRAVENOUS | Status: DC
  Administered 2023-06-27: 2 m[IU]/min via INTRAVENOUS

## 2023-06-27 MED ORDER — MISOPROSTOL 25 MCG QUARTER TABLET
25.0000 ug | ORAL_TABLET | Freq: Once | ORAL | Status: DC
  Filled 2023-06-27: qty 1

## 2023-06-27 MED ORDER — MISOPROSTOL 50MCG HALF TABLET
50.0000 ug | ORAL_TABLET | Freq: Once | ORAL | Status: DC
  Filled 2023-06-27: qty 1

## 2023-06-27 MED ORDER — TRANEXAMIC ACID-NACL 1000-0.7 MG/100ML-% IV SOLN
INTRAVENOUS | Status: AC
  Administered 2023-06-27: 1000 mg via INTRAVENOUS
  Filled 2023-06-27: qty 100

## 2023-06-27 MED ORDER — METHYLERGONOVINE MALEATE 0.2 MG PO TABS
0.2000 mg | ORAL_TABLET | Freq: Three times a day (TID) | ORAL | Status: DC
  Administered 2023-06-28 – 2023-06-29 (×4): 0.2 mg via ORAL
  Filled 2023-06-27 (×4): qty 1

## 2023-06-27 MED ORDER — LACTATED RINGERS IV SOLN: INTRAVENOUS | Status: DC

## 2023-06-27 MED ORDER — LIDOCAINE HCL (PF) 1 % IJ SOLN
INTRAMUSCULAR | Status: AC
  Filled 2023-06-27: qty 5

## 2023-06-27 MED ORDER — IBUPROFEN 600 MG PO TABS
600.0000 mg | ORAL_TABLET | Freq: Four times a day (QID) | ORAL | Status: DC
  Administered 2023-06-27 – 2023-06-29 (×7): 600 mg via ORAL
  Filled 2023-06-27 (×7): qty 1

## 2023-06-27 MED ORDER — SODIUM CHLORIDE 0.9% IV SOLUTION: Freq: Once | INTRAVENOUS | Status: DC

## 2023-06-27 MED ORDER — LACTATED RINGERS IV SOLN
500.0000 mL | Freq: Once | INTRAVENOUS | Status: AC
  Administered 2023-06-27: 500 mL via INTRAVENOUS

## 2023-06-27 MED ORDER — SODIUM CHLORIDE 0.9 % IV SOLN: INTRAVENOUS | Status: DC | PRN

## 2023-06-27 MED ORDER — ACETAMINOPHEN 10 MG/ML IV SOLN
INTRAVENOUS | Status: AC
  Filled 2023-06-27: qty 100

## 2023-06-27 MED ORDER — METHYLERGONOVINE MALEATE 0.2 MG/ML IJ SOLN
0.2000 mg | INTRAMUSCULAR | Status: DC | PRN
  Administered 2023-06-27: 0.2 mg via INTRAMUSCULAR
  Filled 2023-06-27: qty 1

## 2023-06-27 MED ORDER — LIDOCAINE HCL (PF) 1 % IJ SOLN: 30.0000 mL | INTRAMUSCULAR | Status: DC | PRN

## 2023-06-27 MED ORDER — DROPERIDOL 2.5 MG/ML IJ SOLN: INTRAMUSCULAR | Status: DC | PRN

## 2023-06-27 MED ORDER — PHENYLEPHRINE 80 MCG/ML (10ML) SYRINGE FOR IV PUSH (FOR BLOOD PRESSURE SUPPORT)
PREFILLED_SYRINGE | INTRAVENOUS | Status: DC | PRN
  Administered 2023-06-27 (×4): 160 ug via INTRAVENOUS

## 2023-06-27 MED ORDER — CALCIUM CARBONATE ANTACID 500 MG PO CHEW
400.0000 mg | CHEWABLE_TABLET | Freq: Three times a day (TID) | ORAL | Status: DC | PRN
  Administered 2023-06-27: 400 mg via ORAL
  Filled 2023-06-27: qty 2

## 2023-06-27 MED ORDER — OXYTOCIN-SODIUM CHLORIDE 30-0.9 UT/500ML-% IV SOLN
2.5000 [IU]/h | INTRAVENOUS | Status: DC
  Filled 2023-06-27: qty 500

## 2023-06-27 MED ORDER — DIPHENHYDRAMINE HCL 25 MG PO CAPS: 25.0000 mg | ORAL_CAPSULE | Freq: Four times a day (QID) | ORAL | Status: DC | PRN

## 2023-06-27 MED ORDER — FENTANYL CITRATE (PF) 100 MCG/2ML IJ SOLN
INTRAMUSCULAR | Status: DC | PRN
  Administered 2023-06-27: 100 ug via EPIDURAL

## 2023-06-27 MED ORDER — PHENYLEPHRINE HCL (PRESSORS) 10 MG/ML IV SOLN
INTRAVENOUS | Status: DC | PRN
  Administered 2023-06-27: 160 ug via INTRAVENOUS
  Administered 2023-06-27: 80 ug via INTRAVENOUS

## 2023-06-27 MED ORDER — FLEET ENEMA 7-19 GM/118ML RE ENEM: 1.0000 | ENEMA | RECTAL | Status: DC | PRN

## 2023-06-27 MED ORDER — LIDOCAINE-EPINEPHRINE (PF) 2 %-1:200000 IJ SOLN: INTRAMUSCULAR | Status: DC | PRN

## 2023-06-27 MED ORDER — LACTATED RINGERS IV SOLN: 500.0000 mL | INTRAVENOUS | Status: DC | PRN

## 2023-06-27 MED ORDER — WITCH HAZEL-GLYCERIN EX PADS: 1.0000 | MEDICATED_PAD | CUTANEOUS | Status: DC | PRN

## 2023-06-27 MED ORDER — OXYCODONE-ACETAMINOPHEN 5-325 MG PO TABS: 2.0000 | ORAL_TABLET | ORAL | Status: DC | PRN

## 2023-06-27 MED ORDER — DIPHENHYDRAMINE HCL 50 MG/ML IJ SOLN: 12.5000 mg | INTRAMUSCULAR | Status: DC | PRN

## 2023-06-27 MED ORDER — FENTANYL-BUPIVACAINE-NACL 0.5-0.125-0.9 MG/250ML-% EP SOLN
12.0000 mL/h | EPIDURAL | Status: DC | PRN
  Administered 2023-06-27: 12 mL/h via EPIDURAL
  Filled 2023-06-27: qty 250

## 2023-06-27 MED ORDER — DEXAMETHASONE SODIUM PHOSPHATE 4 MG/ML IJ SOLN
INTRAMUSCULAR | Status: AC
  Filled 2023-06-27: qty 1

## 2023-06-27 MED ORDER — HYDROXYZINE HCL 50 MG PO TABS: 50.0000 mg | ORAL_TABLET | Freq: Four times a day (QID) | ORAL | Status: DC | PRN

## 2023-06-27 MED ORDER — NICOTINE 14 MG/24HR TD PT24
14.0000 mg | MEDICATED_PATCH | Freq: Every day | TRANSDERMAL | Status: DC
  Administered 2023-06-27 – 2023-06-29 (×3): 14 mg via TRANSDERMAL
  Filled 2023-06-27 (×3): qty 1

## 2023-06-27 MED ORDER — PHENYLEPHRINE HCL-NACL 20-0.9 MG/250ML-% IV SOLN
INTRAVENOUS | Status: DC | PRN
  Administered 2023-06-27: 60 ug/min via INTRAVENOUS

## 2023-06-27 MED ORDER — CEFAZOLIN SODIUM-DEXTROSE 2-4 GM/100ML-% IV SOLN
2.0000 g | Freq: Three times a day (TID) | INTRAVENOUS | Status: AC
  Administered 2023-06-27 – 2023-06-28 (×3): 2 g via INTRAVENOUS
  Filled 2023-06-27 (×6): qty 100

## 2023-06-27 MED ORDER — OXYCODONE-ACETAMINOPHEN 5-325 MG PO TABS: 1.0000 | ORAL_TABLET | ORAL | Status: DC | PRN

## 2023-06-27 MED ORDER — PHENYLEPHRINE 80 MCG/ML (10ML) SYRINGE FOR IV PUSH (FOR BLOOD PRESSURE SUPPORT)
PREFILLED_SYRINGE | INTRAVENOUS | Status: AC
  Filled 2023-06-27: qty 10

## 2023-06-27 MED ORDER — FENTANYL CITRATE (PF) 100 MCG/2ML IJ SOLN
25.0000 ug | INTRAMUSCULAR | Status: DC | PRN
  Administered 2023-06-27 (×2): 25 ug via INTRAVENOUS
  Administered 2023-06-27: 50 ug via INTRAVENOUS

## 2023-06-27 MED ORDER — PRENATAL MULTIVITAMIN CH
1.0000 | ORAL_TABLET | Freq: Every day | ORAL | Status: DC
  Administered 2023-06-28: 1 via ORAL
  Filled 2023-06-27: qty 1

## 2023-06-27 MED ORDER — SENNOSIDES-DOCUSATE SODIUM 8.6-50 MG PO TABS
2.0000 | ORAL_TABLET | ORAL | Status: DC
  Administered 2023-06-27 – 2023-06-28 (×2): 2 via ORAL
  Filled 2023-06-27 (×2): qty 2

## 2023-06-27 MED ORDER — SIMETHICONE 80 MG PO CHEW: 80.0000 mg | CHEWABLE_TABLET | ORAL | Status: DC | PRN

## 2023-06-27 MED ORDER — MISOPROSTOL 25 MCG QUARTER TABLET: 25.0000 ug | ORAL_TABLET | ORAL | Status: DC | PRN

## 2023-06-27 MED ORDER — LACTATED RINGERS IV BOLUS: 1000.0000 mL | Freq: Once | INTRAVENOUS | Status: DC

## 2023-06-27 MED ORDER — BENZOCAINE-MENTHOL 20-0.5 % EX AERO: 1.0000 | INHALATION_SPRAY | CUTANEOUS | Status: DC | PRN

## 2023-06-27 MED ORDER — ALBUMIN HUMAN 5 % IV SOLN: INTRAVENOUS | Status: DC | PRN

## 2023-06-27 MED ORDER — ZOLPIDEM TARTRATE 5 MG PO TABS
5.0000 mg | ORAL_TABLET | Freq: Every evening | ORAL | Status: DC | PRN
  Administered 2023-06-27 – 2023-06-28 (×2): 5 mg via ORAL
  Filled 2023-06-27 (×2): qty 1

## 2023-06-27 SURGICAL SUPPLY — 17 items
CATH ROBINSON RED A/P 16FR (CATHETERS) ×1 IMPLANT
CONT PATH 16OZ SNAP LID 3702 (MISCELLANEOUS) ×1 IMPLANT
GLOVE BIOGEL PI IND STRL 7.0 (GLOVE) ×1 IMPLANT
GLOVE SURG SS PI 7.0 STRL IVOR (GLOVE) ×1 IMPLANT
GLOVE SURG UNDER POLY LF SZ7.5 (GLOVE) ×1 IMPLANT
GOWN STRL REUS W/ TWL LRG LVL3 (GOWN DISPOSABLE) ×1 IMPLANT
GOWN STRL REUS W/ TWL XL LVL3 (GOWN DISPOSABLE) ×1 IMPLANT
GOWN STRL REUS W/TWL LRG LVL3 (GOWN DISPOSABLE) ×1
GOWN STRL REUS W/TWL XL LVL3 (GOWN DISPOSABLE) ×1
HIBICLENS CHG 4% 4OZ BTL (MISCELLANEOUS) ×1 IMPLANT
NS IRRIG 1000ML POUR BTL (IV SOLUTION) ×1 IMPLANT
PACK VAGINAL MINOR WOMEN LF (CUSTOM PROCEDURE TRAY) ×1 IMPLANT
PAD OB MATERNITY 4.3X12.25 (PERSONAL CARE ITEMS) ×1 IMPLANT
PAD PREP 24X48 CUFFED NSTRL (MISCELLANEOUS) ×1 IMPLANT
SUT VIC AB 3-0 SH 27 (SUTURE) ×1
SUT VIC AB 3-0 SH 27XBRD (SUTURE) IMPLANT
TOWEL OR 17X24 6PK STRL BLUE (TOWEL DISPOSABLE) ×2 IMPLANT

## 2023-06-27 NOTE — Plan of Care (Signed)
  Problem: Health Behavior/Discharge Planning: Goal: Ability to manage health-related needs will improve Outcome: Progressing   Problem: Clinical Measurements: Goal: Ability to maintain clinical measurements within normal limits will improve Outcome: Progressing Goal: Will remain free from infection Outcome: Progressing Goal: Diagnostic test results will improve Outcome: Progressing Goal: Respiratory complications will improve Outcome: Progressing Goal: Cardiovascular complication will be avoided Outcome: Progressing   Problem: Activity: Goal: Risk for activity intolerance will decrease Outcome: Progressing   Problem: Nutrition: Goal: Adequate nutrition will be maintained Outcome: Progressing   Problem: Coping: Goal: Level of anxiety will decrease Outcome: Progressing   Problem: Education: Goal: Knowledge of condition will improve Outcome: Progressing Goal: Individualized Educational Video(s) Outcome: Progressing Goal: Individualized Newborn Educational Video(s) Outcome: Progressing   Problem: Activity: Goal: Will verbalize the importance of balancing activity with adequate rest periods Outcome: Progressing Goal: Ability to tolerate increased activity will improve Outcome: Progressing   Problem: Coping: Goal: Ability to identify and utilize available resources and services will improve Outcome: Progressing   Problem: Life Cycle: Goal: Chance of risk for complications during the postpartum period will decrease Outcome: Progressing   Problem: Role Relationship: Goal: Ability to demonstrate positive interaction with newborn will improve Outcome: Progressing   Problem: Skin Integrity: Goal: Demonstration of wound healing without infection will improve Outcome: Progressing   Problem: Education: Goal: Knowledge of condition will improve Outcome: Progressing Goal: Individualized Educational Video(s) Outcome: Progressing Goal: Individualized Newborn Educational  Video(s) Outcome: Progressing   Problem: Activity: Goal: Will verbalize the importance of balancing activity with adequate rest periods Outcome: Progressing Goal: Ability to tolerate increased activity will improve Outcome: Progressing   Problem: Coping: Goal: Ability to identify and utilize available resources and services will improve Outcome: Progressing   Problem: Life Cycle: Goal: Chance of risk for complications during the postpartum period will decrease Outcome: Progressing   Problem: Role Relationship: Goal: Ability to demonstrate positive interaction with newborn will improve Outcome: Progressing   Problem: Skin Integrity: Goal: Demonstration of wound healing without infection will improve Outcome: Progressing

## 2023-06-27 NOTE — Brief Op Note (Addendum)
06/27/2023  3:16 PM  PATIENT:  Rebekah [name]  32 y.o. female  PRE-OPERATIVE DIAGNOSIS:  Dilatation and Curettage for retained products  POST-OPERATIVE DIAGNOSIS:  Dilatation and Curettage for retained products. PPH  PROCEDURE:  Procedure(s): DILATATION AND CURETTAGE (N/A) via ultrasound guidance and JADA placement  SURGEON:  Surgeon(s) and Role:    * Bell Bing, MD - Primary  ASSISTANTS: Dr. Nobie Putnam OB Fellow   ANESTHESIA:   epidural  EBL:  2L (in room and OR combined  IVF: albumin, crystalloid   BLOOD ADMINISTERED: 1U PRBC  DRAINS: indwelling foley UOP  ANTIBIOTICS: ANCEF 2gm given pre operatively  LOCAL MEDICATIONS USED:  NONE  SPECIMEN:  products of conception  DISPOSITION OF SPECIMEN:  PATHOLOGY  COUNTS:  YES  TOURNIQUET:  * No tourniquets in log *  DICTATION: .Note written in EPIC  PLAN OF CARE: Admit to inpatient   PATIENT DISPOSITION:  PACU - hemodynamically stable.   Delay start of Pharmacological VTE agent (>24hrs) due to surgical blood loss or risk of bleeding: yes  Cornelia Copa MD Attending Center for [employer])

## 2023-06-27 NOTE — Anesthesia Postprocedure Evaluation (Signed)
Anesthesia Post Note  Patient: Rebekah [name] Section  Procedure(s) Performed: DILATATION AND CURETTAGE (Vagina )     Patient location during evaluation: PACU Anesthesia Type: Epidural Level of consciousness: awake and alert Pain management: pain level controlled Vital Signs Assessment: post-procedure vital signs reviewed and stable Respiratory status: spontaneous breathing, nonlabored ventilation and respiratory function stable Cardiovascular status: tachycardic and blood pressure returned to baseline Postop Assessment: no apparent nausea or vomiting Anesthetic complications: no Comments: Continues to be tachycardic in 110s-120s. BP stable with MAP 80s-90s. Jada removed in PACU by OB with no further observed bleeding. Epidural left in place until tomorrow. Repeat CBC drawn and pending-Dr. Vergie Living will follow up on OBSC. Stephannie Peters, MD   No notable events documented.  Last Vitals:  Vitals:   06/27/23 1645 06/27/23 1700  BP: 105/71 113/82  Pulse: (!) 124 (!) 126  Resp: 17 20  Temp:  37.2 C  SpO2: 97% 98%    Last Pain:  Vitals:   06/27/23 1700  TempSrc: Oral  PainSc: 6    Pain Goal:    LLE Motor Response: Purposeful movement (06/27/23 1700) LLE Sensation: Tingling (06/27/23 1700) RLE Motor Response: Purposeful movement (06/27/23 1700) RLE Sensation: Tingling (06/27/23 1700)     Epidural/Spinal Function Cutaneous sensation: Tingles (06/27/23 1700), Patient able to flex knees: Yes (06/27/23 1700), Patient able to lift hips off bed: No (06/27/23 1700), Back pain beyond tenderness at insertion site: No (06/27/23 1700), Progressively worsening motor and/or sensory loss: No (06/27/23 1700), Bowel and/or bladder incontinence post epidural: No (06/27/23 1700)  Shanda Howells

## 2023-06-27 NOTE — Anesthesia Preprocedure Evaluation (Signed)
Anesthesia Evaluation  Patient identified by MRN, date of birth, ID band Patient awake    Reviewed: Allergy & Precautions, NPO status , Patient's Chart, lab work & pertinent test results  History of Anesthesia Complications Negative for: history of anesthetic complications  Airway Mallampati: II  TM Distance: >3 FB Neck ROM: Full    Dental  (+) Lower Dentures, Upper Dentures   Pulmonary Current Smoker   Pulmonary exam normal        Cardiovascular negative cardio ROS Normal cardiovascular exam     Neuro/Psych negative neurological ROS  negative psych ROS   GI/Hepatic negative GI ROS,,,(+)     substance abuse (hx of OUD, on methadone)    Endo/Other  negative endocrine ROS    Renal/GU negative Renal ROS  negative genitourinary   Musculoskeletal negative musculoskeletal ROS (+)    Abdominal   Peds  Hematology negative hematology ROS (+)   Anesthesia Other Findings Day of surgery medications reviewed with patient.  Reproductive/Obstetrics                              Anesthesia Physical Anesthesia Plan  ASA: 2 and emergent  Anesthesia Plan: Epidural   Post-op Pain Management:    Induction:   PONV Risk Score and Plan: Treatment may vary due to age or medical condition  Airway Management Planned: Natural Airway  Additional Equipment:   Intra-op Plan:   Post-operative Plan:   Informed Consent: I have reviewed the patients History and Physical, chart, labs and discussed the procedure including the risks, benefits and alternatives for the proposed anesthesia with the patient or authorized representative who has indicated his/her understanding and acceptance.       Plan Discussed with: CRNA  Anesthesia Plan Comments: (Urgent D&E for retained POC. EBL 600cc in labor room. Will proceed with epidural anesthesia. 2u pRBCs crossmatched. Stephannie Peters, MD)         Anesthesia Quick  Evaluation

## 2023-06-27 NOTE — Progress Notes (Addendum)
OB Note Manual placenta extraction done due to moderately adherent placenta. Placenta with sac out and there was patchyiness with the placenta and I could feel some still in the fundus. Recommend OR for D&C. EBL thus far. Patient amenable to plan  CBC ordered. Midnight hemoglobin 9.3, will crossmatch for 2U  Cornelia Copa MD Attending Center for [facility] (Faculty Practice) 06/27/2023 Time: [phone]

## 2023-06-27 NOTE — H&P (Signed)
Rebekah [name] is a 32 y.o. G71P2002 female at [redacted]w[redacted]d by [redacted]w[redacted]d u/s presenting for IOL due to FGR 6th% with ^UAD; also with fetal L club foot, OUD, and smoker. Reports active fetal movement, contractions: irreg/mild, vaginal bleeding: none, membranes: intact.  Initiated prenatal care at [facility] at 13.4 wks.   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  ([facility]) # fetal L club foot # ^ r/f T21 from IT > LR NIPS # declined 2h GTT (states did qid CBGs but not evaluated) # hx CIN-2> needs colpo PP  Prenatal History/Complications:  # term SVD 2011, 2016 (SGA, 2353g) - both in [place] # retained placenta with 2016 birth> removed in OR  Past Medical History: Past Medical History:  Diagnosis Date   Chronic back pain    Chronic back pain    Chronic pelvic pain in female    Chronic pelvic pain in female    Contraceptive management 01/30/2015   History of retained placenta in prior pregnancy, currently pregnant 01/14/2023   Didn't hemorrhage, removed in OR ([place], [place])   Implanon removal 01/30/2015   Nausea and vomiting during pregnancy 03/06/2015   Opioid use disorder    Ovarian cyst    Pregnant 03/06/2015   Vaginal Pap smear, abnormal     Past Surgical History: Past Surgical History:  Procedure Laterality Date   CHOLECYSTECTOMY  07/31/2011   Procedure: LAPAROSCOPIC CHOLECYSTECTOMY;  Surgeon: Fabio Bering;  Location: [facility];  Service: General;  Laterality: N/A;   placenta removed  11/08/15    Obstetrical History: OB History     Gravida  3   Para  2   Term  2   Preterm      AB      Living  2      SAB      IAB      Ectopic      Multiple      Live Births  2           Social History: Social History   Socioeconomic History   Marital status: Single    Spouse name: Not on file   Number of children: Not  on file   Years of education: Not on file   Highest education level: Not on file  Occupational History   Not on file  Tobacco Use   Smoking status: Every Day    Packs/day: 1.00    Years: 8.00    Additional pack years: 0.00    Total pack years: 8.00    Types: Cigarettes   Smokeless tobacco: Never  Vaping Use   Vaping Use: Former  Substance and Sexual Activity   Alcohol use: No    Alcohol/week: 1.0 standard drink of alcohol    Types: 1 Cans of beer per week    Comment: not now   Drug use: Not Currently    Types: Hydrocodone    Comment: Currently on Methadone   Sexual activity: Not Currently    Birth control/protection: None  Other Topics Concern   Not on file  Social History Narrative   Not on file   Social Determinants of Health   Financial Resource Strain: Medium Risk (12/18/2022)   Overall Financial Resource Strain (CARDIA)    Difficulty of Paying Living Expenses: Somewhat hard  Food Insecurity: No Food Insecurity (  06/27/2023)   Hunger Vital Sign    Worried About Running Out of Food in the Last Year: Never true    Ran Out of Food in the Last Year: Never true  Transportation Needs: No Transportation Needs (06/27/2023)   PRAPARE - Administrator, Civil Service (Medical): No    Lack of Transportation (Non-Medical): No  Physical Activity: Inactive (12/18/2022)   Exercise Vital Sign    Days of Exercise per Week: 0 days    Minutes of Exercise per Session: 0 min  Stress: Stress Concern Present (12/18/2022)   [employer] of Occupational Health - Occupational Stress Questionnaire    Feeling of Stress : To some extent  Social Connections: Moderately Isolated (12/18/2022)   Social Connection and Isolation Panel [NHANES]    Frequency of Communication with Friends and Family: More than three times a week    Frequency of Social Gatherings with Friends and Family: Twice a week    Attends Religious Services: Never    Database administrator or Organizations: No     Attends Engineer, structural: Not on file    Marital Status: Living with partner    Family History: Family History  Problem Relation Age of Onset   Cancer Mother        cervical   Hypertension Mother    Diabetes Mother        pre-diabetes   Cancer Maternal Aunt        cervical   Cancer Maternal Grandmother        cervical   Heart disease Maternal Grandfather    Hypertension Maternal Grandfather    Diabetes Maternal Grandfather    Hyperlipidemia Maternal Grandfather    Cancer Paternal Grandmother        cervial, breast   Diabetes Paternal Grandmother    Hypertension Paternal Grandmother     Allergies: No Known Allergies  Medications Prior to Admission  Medication Sig Dispense Refill Last Dose   Accu-Chek Softclix Lancets lancets Use as instructed to check blood sugar 4 times daily for 2 weeks 50 each 1    blood glucose meter kit and supplies Check blood sugar 4 times daily 1 each 0    gabapentin (NEURONTIN) 300 MG capsule Take 1 capsule (300 mg total) by mouth 3 (three) times daily. 60 capsule 0    glucose blood (ACCU-CHEK GUIDE) test strip Use as instructed to check blood sugar 4 times daily for 2 weeks 50 each 1    methadone (DOLOPHINE) 10 MG tablet Take 110 mg by mouth daily.      omeprazole (PRILOSEC OTC) 20 MG tablet Take 1 tablet (20 mg total) by mouth daily. 30 tablet 4    Prenatal Vit-Fe Fumarate-FA (PRENATAL VITAMIN PO) Take by mouth.      promethazine (PHENERGAN) 12.5 MG tablet TAKE ONE TABLET BY MOUTH EVERY 6 HOURS AS NEEDED for nausea and vomiting 30 tablet 6     Review of Systems  Pertinent pos/neg as indicated in HPI  Blood pressure 118/75, pulse 85, temperature 98.4 F (36.9 C), temperature source Oral, resp. rate 16, height 5\' 2"  (1.575 m), weight 68.3 kg, last menstrual period 10/22/2022, unknown if currently breastfeeding. General appearance: alert, cooperative, and no distress Lungs: clear to auscultation bilaterally Heart: regular rate  and rhythm Abdomen: gravid, soft, non-tender, EFW by Leopold's approximately 5lbs Extremities: tr edema  Fetal monitoring: FHR: 120-130s bpm, variability: moderate,  Accelerations: Present,  decelerations:  Absent Uterine activity: irreg Dilation: 3 Effacement (%):  50 Station: -2 Exam by:: Hipolito Bayley, RN Presentation: cephalic   Prenatal labs: ABO, Rh: --/--/O POS (06/30 0050) Antibody: NEG (06/30 0050) Rubella: 1.76 (01/18 1016) RPR: Non Reactive (04/23 1104)  HBsAg: Negative (01/18 1016)  HIV: Non Reactive (04/23 1104)  GBS: Negative/-- (06/18 1400)  2hr GTT: declined (did qid CBGs but results not evaluated), A1c pending  Prenatal Transfer Tool  Maternal Diabetes: No (A1c pending) Genetic Screening: Normal (low risk NIPS) Maternal Ultrasounds/Referrals: IUGR and Other:^ UAD, L club foot, short femurs Fetal Ultrasounds or other Referrals:  Referred to Materal Fetal Medicine  Maternal Substance Abuse:  Yes:  Type: Smoker, Methadone Significant Maternal Medications:  None Significant Maternal Lab Results: Group B Strep negative  Results for orders placed or performed during the hospital encounter of 06/27/23 (from the past 24 hour(s))  CBC   Collection Time: 06/27/23 12:50 AM  Result Value Ref Range   WBC 9.4 4.0 - 10.5 K/uL   RBC 3.13 (L) 3.87 - 5.11 MIL/uL   Hemoglobin 9.3 (L) 12.0 - 15.0 g/dL   HCT 16.1 (L) 09.6 - 04.5 %   MCV 92.0 80.0 - 100.0 fL   MCH 29.7 26.0 - 34.0 pg   MCHC 32.3 30.0 - 36.0 g/dL   RDW 40.9 81.1 - 91.4 %   Platelets 184 150 - 400 K/uL   nRBC 0.2 0.0 - 0.2 %  Type and screen   Collection Time: 06/27/23 12:50 AM  Result Value Ref Range   ABO/RH(D) O POS    Antibody Screen NEG    Sample Expiration      06/30/2023,2359 Performed at [facility], [address], [place], [place] [address]      Assessment:  [redacted]w[redacted]d SIUP  G3P2002  FGR w Royetta Asal  L fetal club foot/short femurs  OUD (Methadone)  Smoker  Cat 1 FHR  GBS Negative/--  (06/18 1400)  Plan:  Admit to L&D  IV pain meds/epidural prn active labor  Pitocin to start as cx is favorable; plan for AROM  Anticipate vaginal delivery   Plans to breastfeed  Contraception: interval BTL (no papers yet)  Circumcision: yes  [name] CNM 06/27/2023, 3:20 AM

## 2023-06-27 NOTE — Discharge Summary (Signed)
Postpartum Discharge Summary  Date of Service updated: 06/29/23     Patient Name: Rebekah [name] DOB: 01/19/1991 MRN: [id number]  Date of admission: 06/27/2023 Delivery date:06/27/2023  Delivering provider: Celedonio Savage  Date of discharge: 06/29/2023  Admitting diagnosis: IUGR (intrauterine growth restriction) affecting care of mother [O36.5990] Intrauterine pregnancy: [redacted]w[redacted]d     Secondary diagnosis:  Principal Problem:   IUGR (intrauterine growth restriction) affecting care of mother Active Problems:   Abnormal Pap smear of cervix   Supervision of high risk pregnancy, antepartum   Substance use disorder   Fetal abnormality affecting management of mother   Retained products of conception with hemorrhage   Postpartum hemorrhage   Acute blood loss anemia  Additional problems: n/a    Discharge diagnosis: Term Pregnancy Delivered, PPH, and retained placenta requiring manual extraction                                               Post partum procedures:curettage  Augmentation: AROM and Pitocin Complications: Hemorrhage>1043mL, retained products of conception with manual extraction of placenta and D&C.  Hospital course: Induction of Labor With Vaginal Delivery   32 y.o. yo G3P3003 at [redacted]w[redacted]d was admitted to the hospital 06/27/2023 for induction of labor.  Indication for induction:  FGR with abnormal UAD .  Patient had an labor course complicated by retained placenta.  Manual extraction was performed but there was still adherent placenta.  Patient was taken to the OR for D&C.  Total EBL approximately 2700 mL.  Counseled patient that she should have no further pregnancies. Membrane Rupture Time/Date: 7:36 AM ,06/27/2023   Delivery Method:Vaginal, Spontaneous  Episiotomy: None  Lacerations:  1st degree;Perineal;Labial  Details of delivery can be found in separate delivery note.  Patient had a postpartum course complicated byadherent placenta, hemorrhage and need for transfusion. Patient  is discharged home 06/29/23.  Newborn Data: Birth date:06/27/2023  Birth time:12:57 PM  Gender:Female  Living status:Living  Apgars:9 ,9  Weight:2140 g   Magnesium Sulfate received: No BMZ received: No Rhophylac:N/A MMR:N/A T-DaP: unsure Flu: No Transfusion:Yes  Physical exam  Vitals:   06/28/23 2001 06/29/23 0021 06/29/23 0517 06/29/23 0939  BP: 130/81 (!) 123/47 (!) 124/91 128/84  Pulse: 76 (!) 101 77 75  Resp: 18 18 19    Temp: 98.5 F (36.9 C) 97.7 F (36.5 C) (!) 97.5 F (36.4 C) 98.1 F (36.7 C)  TempSrc: Oral Oral Oral Oral  SpO2: 97% 98% 99% 100%  Weight:      Height:       General: alert, cooperative, and no distress Lochia: appropriate Uterine Fundus: firm Incision: N/A DVT Evaluation: No evidence of DVT seen on physical exam. Negative Homan's sign. Labs: Lab Results  Component Value Date   WBC 9.2 06/29/2023   HGB 9.2 (L) 06/29/2023   HCT 27.2 (L) 06/29/2023   MCV 91.3 06/29/2023   PLT 133 (L) 06/29/2023      Latest Ref Rng & Units 01/09/2014   12:55 AM  CMP  Glucose 70 - 99 mg/dL 409   BUN 6 - 23 mg/dL 12   Creatinine 8.11 - 1.10 mg/dL 9.14   Sodium 782 - 956 mEq/L 138   Potassium 3.7 - 5.3 mEq/L 4.0   Chloride 96 - 112 mEq/L 102   CO2 19 - 32 mEq/L 26   Calcium 8.4 - 10.5  mg/dL 9.0    Edinburgh Score:     No data to display           After visit meds:  Allergies as of 06/29/2023   No Known Allergies      Medication List     STOP taking these medications    Accu-Chek Guide test strip Generic drug: glucose blood   Accu-Chek Softclix Lancets lancets   blood glucose meter kit and supplies   omeprazole 20 MG tablet Commonly known as: PriLOSEC OTC   promethazine 12.5 MG tablet Commonly known as: PHENERGAN       TAKE these medications    gabapentin 300 MG capsule Commonly known as: Neurontin Take 1 capsule (300 mg total) by mouth 3 (three) times daily.   ibuprofen 600 MG tablet Commonly known as: ADVIL Take 1  tablet (600 mg total) by mouth every 6 (six) hours.   methadone 10 MG tablet Commonly known as: DOLOPHINE Take 110 mg by mouth daily.   PRENATAL VITAMIN PO Take by mouth.         Discharge home in stable condition Infant Feeding: Breast Infant Disposition:NICU Discharge instruction: per After Visit Summary and Postpartum booklet. Activity: Advance as tolerated. Pelvic rest for 6 weeks.  Diet: routine diet Future Appointments: Future Appointments  Date Time Provider Department Center  07/16/2023 10:30 AM Lazaro Arms, MD [facility]  08/09/2023  1:50 PM Cheral Marker, CNM [facility]   Follow up Visit:  Follow-up Information     [facility] for [facility] at [facility]. Schedule an appointment as soon as possible for a visit in 2 week(s).   Specialty: Obstetrics and Gynecology Why: BTL discussion with MD Contact information: [address] [place] [address] [phone]        [facility] for [facility] at [facility]. Schedule an appointment as soon as possible for a visit in 1 month(s).   Specialty: Obstetrics and Gynecology Why: postpartum exam Contact information: [address] [place] [address] [phone]               Message sent  Please schedule this patient for a In person postpartum visit in 4 weeks with the following provider: Any provider. Additional Postpartum F/U: None   High risk pregnancy complicated by:  OUD, FGR Delivery mode:  Vaginal, Spontaneous  Anticipated Birth Control:  Plans Interval BTL   06/29/2023 Warden Fillers, MD

## 2023-06-27 NOTE — Anesthesia Preprocedure Evaluation (Addendum)
Anesthesia Evaluation  Patient identified by MRN, date of birth, ID band Patient awake    Reviewed: Allergy & Precautions, Patient's Chart, lab work & pertinent test results  History of Anesthesia Complications Negative for: history of anesthetic complications  Airway Mallampati: II  TM Distance: >3 FB Neck ROM: Full    Dental  (+) Upper Dentures, Lower Dentures   Pulmonary Current Smoker   Pulmonary exam normal        Cardiovascular negative cardio ROS Normal cardiovascular exam     Neuro/Psych negative neurological ROS     GI/Hepatic ,GERD  Medicated,,(+)     substance abuse (hx of OUD, on methadone)    Endo/Other  negative endocrine ROS    Renal/GU negative Renal ROS  negative genitourinary   Musculoskeletal negative musculoskeletal ROS (+)    Abdominal   Peds  Hematology  (+) Blood dyscrasia (Hgb 9.3), anemia   Anesthesia Other Findings Day of surgery medications reviewed with patient.  Reproductive/Obstetrics (+) Pregnancy                             Anesthesia Physical Anesthesia Plan  ASA: 2  Anesthesia Plan: Epidural   Post-op Pain Management:    Induction:   PONV Risk Score and Plan: Treatment may vary due to age or medical condition  Airway Management Planned: Natural Airway  Additional Equipment: Fetal Monitoring  Intra-op Plan:   Post-operative Plan:   Informed Consent: I have reviewed the patients History and Physical, chart, labs and discussed the procedure including the risks, benefits and alternatives for the proposed anesthesia with the patient or authorized representative who has indicated his/her understanding and acceptance.       Plan Discussed with:   Anesthesia Plan Comments:         Anesthesia Quick Evaluation

## 2023-06-27 NOTE — Progress Notes (Signed)
Patient ID: Rebekah [name], female   DOB: 1991/03/08, 32 y.o.   MRN: [id number]  Feeling crampy from Pitocin and wants epidural at some point; open to AROM; cx 5/70/vtx -2, AROM for clear/pink tinged fluid; after AROM FHR down to low 100s requiring hands and knees positioning before it came up to 115-120s; Pit stopped in the meantime (had been on 67mu/min); will get epidural placed and then restart Pit at 17mu/min.  [name] CNM 06/27/2023 7:51 AM

## 2023-06-27 NOTE — Progress Notes (Signed)
LABOR PROGRESS NOTE  Rebekah [name] is a 32 y.o. G3P2002 at [redacted]w[redacted]d  admitted for IOL due to FGR with increased UAD.   Subjective: Comfortable with epidural   Objective: BP 128/85   Pulse 89   Temp 97.7 F (36.5 C) (Oral)   Resp 18   Ht 5\' 2"  (1.575 m)   Wt 68.3 kg   LMP 10/22/2022   BMI 27.53 kg/m  or  Vitals:   06/27/23 0901 06/27/23 0931 06/27/23 1001 06/27/23 1032  BP: 124/76 130/84 121/80 128/85  Pulse: 85 78 79 89  Resp: 16 16 18 18   Temp:      TempSrc:      Weight:      Height:        Dilation: 5 Effacement (%): 80 Station: -2 Presentation: Vertex Exam by:: Dr. Nobie Putnam FHT: baseline rate 120, moderate varibility, + acel, recurrent variable decel Toco: every 2-3   Labs: Lab Results  Component Value Date   WBC 9.4 06/27/2023   HGB 9.3 (L) 06/27/2023   HCT 28.8 (L) 06/27/2023   MCV 92.0 06/27/2023   PLT 184 06/27/2023    Patient Active Problem List   Diagnosis Date Noted   IUGR (intrauterine growth restriction) affecting care of mother 05/03/2023   Fetal abnormality affecting management of mother 02/23/2023   Substance use disorder 01/14/2023   Supervision of high risk pregnancy, antepartum 12/18/2022   Abnormal Pap smear of cervix 10/13/2022    Assessment / Plan: 32 y.o. G3P2002 at [redacted]w[redacted]d here for IOL for FGR  Labor: Pitocin at 8, IUPC placed and amnioinfusion started due to recurrent variable decelerations Fetal Wellbeing:  cat 2, will allow amnioinfusion to work and adjust if needed  Pain Control:  epidural in place  Anticipated MOD:  vaginal delivery   Derrel Nip, MD  OB Fellow  06/27/2023, 11:20 AM

## 2023-06-27 NOTE — Progress Notes (Addendum)
OB Update Note Patient with some abdominal pain. No need for pad changes  Patient Vitals for the past 6 hrs:  BP Temp Temp src Pulse Resp SpO2  06/27/23 1700 113/82 98.9 F (37.2 C) Oral (!) 126 20 98 %  06/27/23 1645 105/71 -- -- (!) 124 17 97 %  06/27/23 1630 102/73 98.3 F (36.8 C) Oral (!) 124 17 97 %  06/27/23 1615 104/76 -- -- (!) 117 15 97 %  06/27/23 1600 107/74 -- -- (!) 117 16 99 %  06/27/23 1545 98/76 -- -- (!) 115 15 99 %  06/27/23 1530 111/84 -- -- (!) 109 17 99 %  06/27/23 1526 92/70 -- -- (!) 129 (!) 23 --  06/27/23 1356 117/66 -- -- (!) 148 16 --  06/27/23 1352 118/65 -- -- (!) 159 16 --  06/27/23 1331 (!) 127/101 -- -- (!) 110 -- --  06/27/23 1318 119/78 -- -- 78 16 --  06/27/23 1302 110/81 -- -- 96 16 --  06/27/23 1231 135/85 -- -- 80 16 --  06/27/23 1201 117/67 97.9 F (36.6 C) Oral 76 16 --  06/27/23 1131 (!) 136/91 -- -- 80 18 --   UOP approximatley in bag (emptied when left the OR) Pad clean with scant amount of blood in the JADA tubing and none in the canister JADA's suction and fluid removed and I came by to check on her 15 minutes later; another cbc was ordered (patient received 1U intra-op and another one after than that was completed in the pacu) No bleeding 15-20 minutes s/p removal of JADA suction and fluid. JADA removed and no bleeding with fundal massage. Firm fundus below the umbilicus  CBC pending and pt stable for transfer to [facility]. NPO for now and okay for clears in a few hours.   Cornelia Copa MD Attending Center for [employer] (Faculty Practice) 06/27/2023 Time: [phone]

## 2023-06-27 NOTE — Transfer of Care (Signed)
Immediate Anesthesia Transfer of Care Note  Patient: Rebekah [name] Section  Procedure(s) Performed: DILATATION AND CURETTAGE (Vagina )  Patient Location: PACU  Anesthesia Type:Epidural  Level of Consciousness: awake, alert , and oriented  Airway & Oxygen Therapy: Patient Spontanous Breathing  Post-op Assessment: Report given to RN and Post -op Vital signs reviewed and stable  Post vital signs: Reviewed and stable  Last Vitals:  Vitals Value Taken Time  BP 111/84 06/27/23 1530  Temp    Pulse 111 06/27/23 1539  Resp 15 06/27/23 1539  SpO2 99 % 06/27/23 1539  Vitals shown include unvalidated device data.  Last Pain:  Vitals:   06/27/23 1318  TempSrc:   PainSc: 0-No pain         Complications: No notable events documented.

## 2023-06-27 NOTE — Op Note (Signed)
Operative Note   06/27/2023  PRE-OP DIAGNOSIS: Retained products of conception after manual extraction of placenta status post a normal spontaneous vaginal delivery. History of retained placenta and need for D&C. FGR IOL   POST-OP DIAGNOSIS: Same. PP Hemorrhage  SURGEON: Surgeon(s) and Role:    * [place] Bing, MD - Primary  ASSISTANT: Dr. Derrel Nip [facility] Fellow)  An experienced assistant was required given the standard of surgical care given the complexity of the case.  This assistant was needed for exposure, dissection, suctioning, retraction, instrument exchange, assisting with delivery with administration of fundal pressure, and for overall help during the procedure.  PROCEDURE:  Dilation and curettage via ultrasound guidance. JADA placement  ANESTHESIA: epidural   ESTIMATED BLOOD LOSS: (on L&D and in OR)  DRAINS: indwelling foley UOP  TOTAL IV FLUIDS: albumin , crystalloid , 1U PRBC  SPECIMENS: products of conception to pathology  VTE PROPHYLAXIS: SCDs to the bilateral lower extremities  ANTIBIOTICS: Ancef 2gm given pre op  COMPLICATIONS: PPH. Densely adherent POCs  DISPOSITION: PACU - hemodynamically stable.  CONDITION: stable  BLOOD TYPE: O POS Performed at [facility], [address] [place], [place] [address] Rhogam given: not applicable  FINDINGS: In OR, ultrasound shows large amount of avascular, echogenic material in the endometrial cavity. On D&C, large amount of "ratty" POC appearing tissue was removed, in addition to a large amount of clots. At the end of the procedure, the endometrial stripe was thinner, still avascular, and the uterine cavity was not welling up with blood and vaginal bleeding was a trickle and stable. Cervix within normal limits and no evidence of laceration  JADA placed via ultrasound guidance and in the appropriate location with no bleeding around the JADA and minimal in the tube.   Small 1st degree  laceration not repaired.   PROCEDURE IN DETAIL:  After informed consent was obtained, the patient was taken to the operating room where anesthesia was obtained without difficulty. The patient was positioned in the dorsal lithotomy position in [place] stirrups. The patient was prepped and draped and the foley placed. The patient was examined under anesthesia, with the above noted findings.  The bi-valved speculum was placed inside the patient's vagina, and the the anterior lip of the cervix was seen and grasped with the ringed forceps.  Curettage with the large banjo curette was done multiple times with the above noted findings. The JADA was then placed, per manufacturer's instructions, with inflated in the balloon and of suction obtained.    Excellent hemostasis was noted, and all instruments were removed, with excellent hemostasis noted throughout.  She was then taken out of dorsal lithotomy.  The patient tolerated the procedure well.  Sponge, lap and instrument counts were correct x2.  The patient was taken to recovery room in excellent condition.  At the end of the procedure, I advised her against any future pregnancies due to the risk of worsening placentation issues, need for hysterectomy, etc. She stated that she did not want anymore children but could not get a BTL because her papers were not signed. Appointment to be set up for a 2 week postpartum visit for this.   Cornelia Copa MD Attending Center for [employer] [facility]wife)

## 2023-06-27 NOTE — Anesthesia Procedure Notes (Signed)
Epidural Patient location during procedure: OB Start time: 06/27/2023 8:13 AM End time: 06/27/2023 8:16 AM  Staffing Anesthesiologist: Kaylyn Layer, MD Performed: anesthesiologist   Preanesthetic Checklist Completed: patient identified, IV checked, risks and benefits discussed, monitors and equipment checked, pre-op evaluation and timeout performed  Epidural Patient position: sitting Prep: DuraPrep and site prepped and draped Patient monitoring: continuous pulse ox, blood pressure and heart rate Approach: midline Location: L3-L4 Injection technique: LOR air  Needle:  Needle type: Tuohy  Needle gauge: 17 G Needle length: 9 cm Needle insertion depth: 5 cm Catheter type: closed end flexible Catheter size: 19 Gauge Catheter at skin depth: 10 cm Test dose: negative and Other (1% lidocaine)  Assessment Events: blood not aspirated, no cerebrospinal fluid, injection not painful, no injection resistance, no paresthesia and negative IV test  Additional Notes Patient identified. Risks, benefits, and alternatives discussed with patient including but not limited to bleeding, infection, nerve damage, paralysis, failed block, incomplete pain control, headache, blood pressure changes, nausea, vomiting, reactions to medication, itching, and postpartum back pain. Confirmed with bedside nurse the patient's most recent platelet count. Confirmed with patient that they are not currently taking any anticoagulation, have any bleeding history, or any family history of bleeding disorders. Patient expressed understanding and wished to proceed. All questions were answered. Sterile technique was used throughout the entire procedure. Please see nursing notes for vital signs.   Crisp LOR on first pass. Test dose was given through epidural catheter and negative prior to continuing to dose epidural or start infusion. Warning signs of high block given to the patient including shortness of breath,  tingling/numbness in hands, complete motor block, or any concerning symptoms with instructions to call for help. Patient was given instructions on fall risk and not to get out of bed. All questions and concerns addressed with instructions to call with any issues or inadequate analgesia.  Reason for block:procedure for pain

## 2023-06-28 ENCOUNTER — Encounter (HOSPITAL_COMMUNITY): Payer: Self-pay | Admitting: Obstetrics and Gynecology

## 2023-06-28 ENCOUNTER — Other Ambulatory Visit: Payer: Medicaid Other

## 2023-06-28 DIAGNOSIS — D62 Acute posthemorrhagic anemia: Secondary | ICD-10-CM | POA: Insufficient documentation

## 2023-06-28 LAB — BPAM RBC
ISSUE DATE / TIME: 202406301439
ISSUE DATE / TIME: 202407011544

## 2023-06-28 LAB — CBC
HCT: 20.1 % — ABNORMAL LOW (ref 36.0–46.0)
Hemoglobin: 6.8 g/dL — CL (ref 12.0–15.0)
MCH: 31.2 pg (ref 26.0–34.0)
MCHC: 33.8 g/dL (ref 30.0–36.0)
MCV: 92.2 fL (ref 80.0–100.0)
Platelets: 112 10*3/uL — ABNORMAL LOW (ref 150–400)
RBC: 2.18 MIL/uL — ABNORMAL LOW (ref 3.87–5.11)
RDW: 14.7 % (ref 11.5–15.5)
WBC: 12.6 10*3/uL — ABNORMAL HIGH (ref 4.0–10.5)
nRBC: 0 % (ref 0.0–0.2)

## 2023-06-28 LAB — HEMOGLOBIN A1C
Hgb A1c MFr Bld: 5.6 % (ref 4.8–5.6)
Mean Plasma Glucose: 114 mg/dL

## 2023-06-28 LAB — TYPE AND SCREEN
ABO/RH(D): O POS
Unit division: 0

## 2023-06-28 LAB — PREPARE RBC (CROSSMATCH)

## 2023-06-28 MED ORDER — FUROSEMIDE 10 MG/ML IJ SOLN
20.0000 mg | Freq: Once | INTRAMUSCULAR | Status: AC
  Administered 2023-06-28: 20 mg via INTRAVENOUS
  Filled 2023-06-28: qty 2

## 2023-06-28 MED ORDER — SODIUM CHLORIDE 0.9% IV SOLUTION: Freq: Once | INTRAVENOUS | Status: AC

## 2023-06-28 NOTE — Lactation Note (Signed)
This note was copied from a baby's chart.  NICU Lactation Consultation Note  Patient Name: Rebekah [name] Today's Date: 06/28/2023 Age:34 hours  Reason for consult: Initial assessment; NICU baby; Early term 37-38.6wks; Other (Comment); Infant < 6lbs (on Methadone)  SUBJECTIVE Visited with family of 17 hours old ETI NICU female; Rebekah [name] is a P3 and experienced breastfeeding. She reports she already started pumping but hasn't been able to follow the 3-hour schedule due to having to receive her blood transfusions. Provided a hands-free pumping top in size S/M "Blue". Her plan is to do both, direct breastfeeding along with bottle feeding with her EBM/formula. Ms. Oestreich wanted to do STS care with baby "Rebekah [name]" NICU RN Gearldine Bienenstock assisting during [facility] consultations. Reviewed pumping schedule, benefits of STS care, lactogenesis II and anticipatory guidelines.   OBJECTIVE Infant data: Mother's Current Feeding Choice: Breast Milk and Formula  Infant feeding assessment No data recorded  Maternal data: [id number]  Vaginal, Spontaneous Has patient been taught Hand Expression?: Yes Hand Expression Comments: colostrum noted Significant Breast History:: (++) breast changes during the pregnancy Current breast feeding challenges:: NICU admission Does the patient have breastfeeding experience prior to this delivery?: Yes How long did the patient breastfeed?: breastfed her last daughter for one year Pumping frequency: 1 time/24 hours Pumped volume: 0 mL (droplets) Flange Size: 21 Risk factor for low milk supply:: infant separation, PPH of 2000 cc  Pump: Personal (Evenflo (two))  ASSESSMENT Infant: Feeding Status: Scheduled 9-12-3-6  Maternal: Milk volume: Normal  INTERVENTIONS/PLAN Interventions: Interventions: Breast feeding basics reviewed; Skin to skin; Coconut oil; DEBP; Education; [employer] brochure Tools: Pump; Flanges; Coconut oil; Hands-free pumping top (Size S/M "Blue") Pump Education:  Setup, frequency, and cleaning; Milk Storage  Plan: Encouraged pumping every 3 hours, ideally 8 pumping sessions/24 hours Breast massage, hand expression and coconut oil were also encouraged prior pumping  FOB present and supportive. All questions and concerns answered, family to contact [facility] services PRN.  Consult Status: NICU follow-up NICU Follow-up type: New admission follow up   Kristin Lamagna S Philis Nettle 06/28/2023, 1:34 PM

## 2023-06-28 NOTE — Progress Notes (Signed)
Pt states she wants to go visit her baby in NICU before she receives any blood. VSS and pt denies dizziness or lightheadedness. Pt verbalized that she will call out when she is back from the NICU.

## 2023-06-28 NOTE — Progress Notes (Signed)
Post Partum Day 1 s/p SVD after IOL for FGR at [redacted]w[redacted]d complicated but PPP with EBL ~2700 ml.  Subjective: No complaints, voiding, tolerating PO, and + flatus. Not much ambulation, feels very tired. Reports baby boy is doing well in NICU.  Objective: Blood pressure 123/80, pulse (!) 104, temperature 98 F (36.7 C), temperature source Oral, resp. rate 17, height 5\' 2"  (1.575 m), weight 68.3 kg, last menstrual period 10/22/2022, SpO2 99 %, unknown if currently breastfeeding.  Physical Exam:  General: alert and no distress Lochia: appropriate Uterine Fundus: firm and mildly tender to palpation Pelvic: Minimal blood on pad reported by patient DVT Evaluation: No evidence of DVT seen on physical exam. Negative Homan's sign. No cords or calf tenderness. No significant calf/ankle edema.     Latest Ref Rng & Units 06/28/2023    5:16 AM 06/27/2023    5:03 PM 06/27/2023    2:46 PM  CBC  WBC 4.0 - 10.5 K/uL 12.6  17.8  18.6   Hemoglobin 12.0 - 15.0 g/dL 6.8  C 8.5  7.1   Hematocrit 36.0 - 46.0 % 20.1  26.5  22.7   Platelets 150 - 400 K/uL 112  139  220     C Corrected result   Assessment/Plan: Anemia due to acute blood loss/PPH:  Already received 2 pRBCs yesterday.  Still very anemic and symptomatic. Counseled about further transfusion, she agreed. Will give 2 additional pRBCs. No current significant bleeding, on Methergine 0.2 mg po tid x 2 days regimen. On Ancef x 24h for infection prophylaxis s/p curettage for removal of her adherent placenta.  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 Breastfeeding, will be scheduled to meet with surgeon outpatient to discuss interval BTL. Patient desires circumcision for her female infant.  Circumcision procedure details discussed, risks and benefits of procedure were also discussed.  These include but are not limited to: Benefits of circumcision in men include reduction in the rates of  urinary tract infection (UTI), penile cancer, some sexually transmitted infections, penile inflammatory and retractile disorders, as well as easier hygiene.  Risks include bleeding , infection, injury of glans which may lead to penile deformity or urinary tract issues, unsatisfactory cosmetic appearance and other potential complications related to the procedure.  It was emphasized that this is an elective procedure.  Patient wants to proceed with circumcision; written informed consent obtained and will be sent to NICU to be placed in baby's chart.  Routine circumcision and post circumcision care ordered for the infant; procedure will be done when the baby is ready for discharge.    LOS: 1 day   Jaynie Collins, MD 06/28/2023, 7:05 AM

## 2023-06-28 NOTE — Anesthesia Postprocedure Evaluation (Signed)
Anesthesia Post Note  Patient: Rebekah [name]  Procedure(s) Performed: AN AD HOC LABOR EPIDURAL     Patient location during evaluation: Mother Baby Anesthesia Type: Epidural Level of consciousness: awake Pain management: satisfactory to patient Vital Signs Assessment: post-procedure vital signs reviewed and stable Respiratory status: spontaneous breathing Cardiovascular status: stable Anesthetic complications: no  No notable events documented.  Last Vitals:  Vitals:   06/27/23 2326 06/28/23 0440  BP: 136/84 123/80  Pulse: 97 (!) 104  Resp: 17 17  Temp: 36.8 C 36.7 C  SpO2: 99% 99%    Last Pain:  Vitals:   06/28/23 0634  TempSrc:   PainSc: 0-No pain   Pain Goal: Patients Stated Pain Goal: 2 (06/27/23 2301)                 Cephus Shelling

## 2023-06-29 ENCOUNTER — Other Ambulatory Visit (HOSPITAL_COMMUNITY): Payer: Self-pay

## 2023-06-29 ENCOUNTER — Ambulatory Visit (HOSPITAL_COMMUNITY): Payer: Self-pay

## 2023-06-29 LAB — TYPE AND SCREEN
Unit division: 0
Unit division: 0

## 2023-06-29 LAB — BPAM RBC
Blood Product Expiration Date: 202407082359
Blood Product Expiration Date: 202407082359
Blood Product Expiration Date: 202407282359
Blood Product Expiration Date: 202407282359
ISSUE DATE / TIME: 202406301439
ISSUE DATE / TIME: 202407010935
Unit Type and Rh: 5100
Unit Type and Rh: 5100
Unit Type and Rh: 5100
Unit Type and Rh: 5100

## 2023-06-29 LAB — CBC WITH DIFFERENTIAL/PLATELET
Abs Immature Granulocytes: 0.14 K/uL — ABNORMAL HIGH (ref 0.00–0.07)
Basophils Absolute: 0 K/uL (ref 0.0–0.1)
Basophils Relative: 0 %
Eosinophils Absolute: 0.1 K/uL (ref 0.0–0.5)
Eosinophils Relative: 1 %
HCT: 27.2 % — ABNORMAL LOW (ref 36.0–46.0)
Hemoglobin: 9.2 g/dL — ABNORMAL LOW (ref 12.0–15.0)
Immature Granulocytes: 2 %
Lymphocytes Relative: 21 %
Lymphs Abs: 2 K/uL (ref 0.7–4.0)
MCH: 30.9 pg (ref 26.0–34.0)
MCHC: 33.8 g/dL (ref 30.0–36.0)
MCV: 91.3 fL (ref 80.0–100.0)
Monocytes Absolute: 0.4 K/uL (ref 0.1–1.0)
Monocytes Relative: 4 %
Neutro Abs: 6.6 K/uL (ref 1.7–7.7)
Neutrophils Relative %: 72 %
Platelets: 133 K/uL — ABNORMAL LOW (ref 150–400)
RBC: 2.98 MIL/uL — ABNORMAL LOW (ref 3.87–5.11)
RDW: 15.7 % — ABNORMAL HIGH (ref 11.5–15.5)
WBC: 9.2 K/uL (ref 4.0–10.5)
nRBC: 0 % (ref 0.0–0.2)

## 2023-06-29 LAB — SURGICAL PATHOLOGY

## 2023-06-29 MED ORDER — IBUPROFEN 600 MG PO TABS
600.0000 mg | ORAL_TABLET | Freq: Four times a day (QID) | ORAL | 0 refills | Status: DC
  Filled 2023-06-29: qty 30, 8d supply, fill #0

## 2023-06-29 NOTE — Lactation Note (Signed)
This note was copied from a baby's chart.  NICU Lactation Consultation Note  Patient Name: Rebekah [name] [id number] Date: 06/29/2023 Age:34 hours  Reason for consult: Follow-up assessment; NICU baby; Early term 109-38.6wks; Maternal discharge; Other (Comment) (SUD on Methadone treatment, PPH (2000 EBL)) <5 lbs. SUBJECTIVE  LC in to visit with P4 Mom of ET infant in the NICU.  Baby "Rebekah [name]" currently being gavage fed while Mom holding him swaddled.  Baby sleeping soundly.  Mom reports she is trying to pump more often today as she is feeling better.  Encouraged Mom to try to pump 8 times per 24 hrs, including one pumping at night.  Mom denies engorgement and states she expressed 4-5 ml last pumping.  LC set up DEBP in baby's room as Mom was discharged today.  LC set up a washing and a drying bin for cleaning of pump parts.  Mom reports she may go home tonight, but will be back tomorrow.    OBJECTIVE Infant data: Mother's Current Feeding Choice: Breast Milk and Formula  Infant feeding assessment Scale for Readiness: 2 Scale for Quality: 4 (intermittent stridor)   Maternal data: [id number]  Vaginal, Spontaneous Has patient been taught Hand Expression?: Yes Hand Expression Comments: colostrum noted Significant Breast History:: (++) breast changes during the pregnancy Current breast feeding challenges:: NICU admission Does the patient have breastfeeding experience prior to this delivery?: Yes How long did the patient breastfeed?: breastfed her last daughter for one year Pumping frequency: encouraged 8 times per 24 hrs, Mom states she has started pumping more often Pumped volume: 4 mL Flange Size: 21 Risk factor for low milk supply:: infant separation, PPH of 2000 cc  Pump: Personal (Evenflo (two))  ASSESSMENT Infant: Feeding Status: Scheduled 9-12-3-6  Maternal: Milk volume: Normal  INTERVENTIONS/PLAN Interventions: Interventions: Skin to skin; Breast massage; Hand express;  DEBP; Education Discharge Education: Engorgement and breast care Tools: Pump; Flanges Pump Education: Setup, frequency, and cleaning; Milk Storage  Plan: Consult Status: NICU follow-up NICU Follow-up type: Verify absence of engorgement; Verify onset of copious milk   Judee Clara 06/29/2023, 4:10 PM

## 2023-06-29 NOTE — Progress Notes (Signed)
Discharge instructions and prescriptions given to pt. Discussed post vaginal delivery care, signs and symptoms to report to the MD, upcoming appointments, and meds.Pt verbalizes understanding and has no questions or concerns at this time. Pt discharged home from hospital in stable condition. 

## 2023-06-29 NOTE — Progress Notes (Signed)
CLINICAL SOCIAL WORK MATERNAL/CHILD NOTE  Patient Details  Name: Rebekah [name] MRN: [id number] Date of Birth: 06/27/2025  Date:  06/29/2023  Clinical Social Worker Initiating Note:  Blaine Hamper Date/Time: Initiated:  06/29/23/1115     Child's Name:  Rebekah [name]   Biological Parents:  Mother, Father   Need for Interpreter:  None   Reason for Referral:  Current Substance Use/Substance Use During Pregnancy     Address:  [address] [place] [address]    Phone number:  [phone] (home)     Additional phone number: FOB's number is [phone]  Household Members/Support Persons (HM/SP):   Household Member/Support Person 1, Household Member/Support Person 2, Household Member/Support Person 3, Household Member/Support Person 4   HM/SP Name Relationship DOB or Age  HM/SP -1 Todd Raegan FOB 09/07/1986  HM/SP -2 Rockne Menghini Daughter 11/08/15  HM/SP -3 Rayne Collins daughter 11/20/10  HM/SP -4 Sophia Raegan step daughter 09/22/11  HM/SP -5        HM/SP -6        HM/SP -7        HM/SP -8          Natural Supports (not living in the home):  Extended Family, Immediate Family, Parent   Professional Supports: None   Employment: Unemployed   Type of Work:     Education:  Engineer, agricultural   Homebound arranged:    Surveyor, quantity Resources:  [employer]   Other Resources:   (Per MOB she plans to apply for [employer] and Sales executive post discharge.)   Cultural/Religious Considerations Which May Impact Care:  Per MOB's record MOB  is Non-Denominational  Strengths:  Ability to meet basic needs  , Compliance with medical plan  , Home prepared for child  , Psychotropic Medications, Pediatrician chosen   Psychotropic Medications:  Methadone      Pediatrician:    [employer] area  Pediatrician List:   [facility]  [facility]    [place] [facility]      Pediatrician Fax  Number:    Risk Factors/Current Problems:  Substance Use   (MOB is currently prescribed Methadone)   Cognitive State:  Alert  , Able to Concentrate  , Goal Oriented  , Insightful  , Linear Thinking     Mood/Affect:  Comfortable  , Interested  , Happy  , Bright  , Relaxed     CSW Assessment: CSW met with MOB in room 103 to complete an assessment for medication management during pregnancy. When CSW arrived, MOB was resting in bed and infant is in NICU in room 341. MOB was inviting and interested in meeting with CSW.  MOB was easy to engage, forthcoming and was open to community resources. CSW inquired about MOB's medication management and MOB reported that she has being receiving Methadone from [facility] for the past 9 years.  MOB was happy to report her 9 year sobriety.  CSW praised MOB for her success and encouraged her to continue with the [facility] program. CSW explained hospital's substance exposure policy and MOB was understanding. MOB denied the use of all illicit substances and CPS hx. MOB was made aware of the 2 drug screenings for the infant.  MOB was understanding and did not have any questions or concerns. CSW informed MOB that infant's UDS was negative and  CSW will continue to monitor infant's CDS and will make  a report to [place] CPS if needed.  CSW asked about PMAD hx and MOB denied any PMAD and MH hx.   Per MOB she feels well informed by NICU team and she denied having any questions or concerns.  MOB reports having all essential items to care for infant including a new car seat and 3 various sleeping areas for infant.  MOB denied having any barriers to visiting with infant post MOB's discharge. MOB communicated that she plans to room in with infant most nights and leave in the morning to receive her Methadone dosing at [facility].  CSW offered MOB meal vouchers to assist with the cost of food while visiting with infant and MOB accepted with gratitude.  CSW reviewed meal voucher protocol  and agreed to provide meal vouchers post MOB's discharge.   CSW will continue to offer resources and supports to family while infant remains in NICU.    CSW Plan/Description:  Psychosocial Support and Ongoing Assessment of Needs, Sudden Infant Death Syndrome (SIDS) Education, Perinatal Mood and Anxiety Disorder (PMADs) Education, Neonatal Abstinence Syndrome (NAS) Education, Other Patient/Family Education, Hospital Drug Screen Policy Information, Other Information/Referral to [facility], CSW Will Continue to Monitor Umbilical Cord Tissue Drug Screen Results and Make Report if Warranted   Blaine Hamper, MSW, LCSW Clinical Social Work [phone]

## 2023-07-01 ENCOUNTER — Ambulatory Visit (HOSPITAL_COMMUNITY): Payer: Self-pay

## 2023-07-01 NOTE — Lactation Note (Signed)
This note was copied from a baby's chart.  NICU Lactation Consultation Note  Patient Name: Rebekah [name] [id number] Date: 07/01/2023 Age:34 days  Reason for consult: Follow-up assessment; NICU baby; Early term 25-38.6wks; Infant < 6lbs; Other (Comment); Mother's request (SUD on Methadone)  SUBJECTIVE  LC in to visit with [name] of ET infant in the NICU.  Mom is in treatment for SUD, on Methadone and baby showing withdrawal symptoms.  Baby is very fussy and is currently being fed by gavage feedings.  Baby has been going to breast for pre-feeding opportunities.  Mom concerned that her pump is not working.  Mom has a hand's free pump for home use.  Encouraged Mom to use the [employer] when here visiting baby.    LC assisted Mom to use the pump and provided her with a hand's free pumping band.  21 flanges are recommended.  Pump noted to be functioning and pulling the nipple freely.  Mom states she hasn't pumped 8 times per 24 hrs, maybe 3-4 times.  LC talked about the importance of consistent pumping to support her milk coming to volume.   OBJECTIVE Infant data: No data recorded Infant feeding assessment Scale for Readiness: 2 (uncoordinated with paci) Scale for Quality: 4   Maternal data: G3P3003  Vaginal, Spontaneous Pumping frequency: 3-4 times per 24 hrs, encouraged to pump 8 times per 24 hrs Pumped volume: 2 mL Flange Size: 21  Pump: Personal (Evenflo (two))  ASSESSMENT Infant: LATCH Documentation Latch: 1 Audible Swallowing: 1 Type of Nipple: 2 Comfort (Breast/Nipple): 2 Hold (Positioning): 0 LATCH Score: 6  Feeding Status: Continuous gastric feedings  Maternal: Milk volume: Normal  INTERVENTIONS/PLAN Interventions: Interventions: Breast feeding basics reviewed; Skin to skin; Breast massage; Hand express; DEBP; Education Discharge Education: Engorgement and breast care Tools: Pump; Flanges; Hands-free pumping top Pump Education: Setup, frequency, and  cleaning; Milk Storage Nipple shield size: 20  Plan: 1- STS with baby as much as possible 2- Offer the breast with feeding cues 3-Pump both breasts every 2-3 hrs during the day and 3-4 hrs at night, adding breast massage and hand expression.  Consult Status: NICU follow-up NICU Follow-up type: Verify absence of engorgement; Verify onset of copious milk   Rebekah [name] 07/01/2023, 2:44 PM

## 2023-07-02 ENCOUNTER — Ambulatory Visit (HOSPITAL_COMMUNITY): Payer: Self-pay

## 2023-07-02 NOTE — Lactation Note (Signed)
This note was copied from a baby's chart.  NICU Lactation Consultation Note  Patient Name: Rebekah [name] Today's Date: 07/02/2023 Age:34 days  Reason for consult: Weekly NICU follow-up; NICU baby; Early term 37-38.6wks; Infant < 6lbs; Other (Comment) (NAS, SUD on Methadone)  SUBJECTIVE Visited with family of 86 days old ETI NICU female for 5 days post-partum F/U; Rebekah [name] is a P3 and experienced breastfeeding. She voiced she's been trying to follow the 3-hour schedule with her pumping, her milk came in this morning, praised her for her efforts. Explained the importance of continuing pumping consistently for the prevention of engorgement and to protect her supply. Baby currently on continues gastric feeds of Elecare 24 calorie formula but team plans on transitioning/supplementing with mother's milk to help with withdrawals. Rebekah [name] voiced that her breast felt "hard" even before she came pregnant because she has a Hx of "mammary gland swelling". This was self-reported, it's not in her chart. Reviewed engorgement prevention/treatment, pumping schedule, pump settings and anticipatory guidelines.   OBJECTIVE Infant data: Mother's Current Feeding Choice: Breast Milk and Formula  Infant feeding assessment Scale for Readiness: 2 (uncoordinated)   Maternal data: G3P3003  Vaginal, Spontaneous Pumping frequency: 6 times/24 hours Pumped volume: 40 mL Flange Size: 21  Pump: Personal (Evenflo (two))  ASSESSMENT Infant: Feeding Status: Continuous gastric feedings  Maternal: Milk volume: Normal No S/S of engorgement at this time  INTERVENTIONS/PLAN Interventions: Interventions: Breast feeding basics reviewed; Coconut oil; DEBP; Education Discharge Education: Engorgement and breast care Tools: Pump; Flanges; Coconut oil; Hands-free pumping top (Size S/M "Blue") Pump Education: Setup, frequency, and cleaning; Milk Storage  Plan: Encouraged to continue pumping every 3 hours, ideally 8  pumping sessions/24 hours She'll switch her pump settings from initiation to maintenance mode She'll continue offering baby with pre-feeding opportunities around touch times    Rebekah [name]. All questions and concerns answered, family to contact [facility] services PRN.  Consult Status: NICU follow-up NICU Follow-up type: Weekly NICU follow up   Rebekah [name] S Rebekah [name] 07/02/2023, 2:35 PM

## 2023-07-03 ENCOUNTER — Ambulatory Visit (HOSPITAL_COMMUNITY): Payer: Self-pay

## 2023-07-03 NOTE — Lactation Note (Signed)
This note was copied from a baby's chart.  NICU Lactation Consultation Note  Patient Name: Rebekah [name] Today's Date: 07/03/2023 Age:34 days  Reason for consult: NICU baby; Early term 47-38.6wks; Infant < 6lbs; Other (Comment); Follow-up assessment; Mother's request; RN request; Engorgement (NAS, SUD on methadone)  SUBJECTIVE Visited with family of 44 days old ETI NICU female; Rebekah [name] called out for lactation, she wasn't getting much output from her R side; NICU RN Dahlia Client had already provided ice packs. Noticed that she hasn't been pumping consistently, she went all night without pumping (see maternal assessment). Revised engorgement prevention/treatment and provided additional ice packs.   OBJECTIVE Infant data: Mother's Current Feeding Choice: Breast Milk and Formula  Infant feeding assessment Scale for Readiness: 2   Maternal data: G3P3003  Vaginal, Spontaneous Pumping frequency: 3 times/24 hours Pumped volume: 36 mL Flange Size: 21  Pump: Personal (Evenflo (two))  ASSESSMENT Infant: Feeding Status: Continuous gastric feedings  Maternal: Milk volume: Low R breast is engorged L breast shows early signs of engorgement on upper R quadrant, other than that tissue is still compressible  INTERVENTIONS/PLAN Interventions: Interventions: Breast feeding basics reviewed; DEBP; Coconut oil; Ice; Education Discharge Education: Engorgement and breast care Tools: Pump; Flanges Pump Education: Setup, frequency, and cleaning; Milk Storage  Plan: Encouraged pumping on maintenance mode every 3 hours, ideally 8 pumping sessions/24 hours She'll continue icing her breast PRN She'll continue offering baby with pre-feeding opportunities around touch times    FOB and big sister present and supportive. All questions and concerns answered, family to contact [facility] services PRN.  Consult Status: NICU follow-up NICU Follow-up type: Weekly NICU follow up   Rebekah [name] 07/03/2023, 10:45  AM

## 2023-07-04 ENCOUNTER — Ambulatory Visit (HOSPITAL_COMMUNITY): Payer: Self-pay

## 2023-07-04 NOTE — Lactation Note (Signed)
This note was copied from a baby's chart.  NICU Lactation Consultation Note  Patient Name: Rebekah [name] [id number] Date: 07/04/2023 Age:34 days  Reason for consult: Follow-up assessment NICU baby, maternal SUD on Methadone SUBJECTIVE  LC met with Mom.  Mom reports that her breasts are feeling better and her volume expressed with pumping has increased.  Mom is pumping more frequently also.  Praised Mom for her efforts.   OBJECTIVE Infant data: Mother's Current Feeding Choice: Breast Milk and Formula  Infant feeding assessment Scale for Readiness: 2   Maternal data: G3P3003  Vaginal, Spontaneous Pumping frequency: 6-8 times per 24 hrs Pumped volume: 120 mL Flange Size: 21  Pump: Personal (Evenflo (two))  ASSESSMENT Infant: LATCH Documentation Latch: 1 Audible Swallowing: 1 Type of Nipple: 1 Comfort (Breast/Nipple): 2 Hold (Positioning): 1 LATCH Score: 6  Feeding Status: Scheduled 9-12-3-6  Maternal: Milk volume: Normal  INTERVENTIONS/PLAN Interventions: Interventions: Skin to skin; Breast massage; Hand express; DEBP Discharge Education: Engorgement and breast care Tools: Pump; Flanges Pump Education: Setup, frequency, and cleaning; Milk Storage Nipple shield size: 20  Plan: Consult Status: NICU follow-up NICU Follow-up type: Weekly NICU follow up   Judee Clara 07/04/2023, 1:32 PM

## 2023-07-05 ENCOUNTER — Other Ambulatory Visit: Payer: Medicaid Other

## 2023-07-08 ENCOUNTER — Other Ambulatory Visit: Payer: Medicaid Other

## 2023-07-08 ENCOUNTER — Encounter: Payer: Medicaid Other | Admitting: Obstetrics & Gynecology

## 2023-07-11 ENCOUNTER — Telehealth: Payer: Self-pay | Admitting: Neonatology

## 2023-07-11 NOTE — Telephone Encounter (Signed)
NAS postpartum in-person consult follow-up. Met with Marisah at her infant's bedside in the NICU. We discussed Connor's ongoing care in the NICU and how she is coping. She verbalized she was excited about his recent discontinuation of morphine treatment need. She discussed her support system and stated she has been able to visit the NICU regularly. Will visit/call again in 4 weeks. Mentioned OUD clinic expected opening date and encouraged Graciemae to call NAS phone with any concerns or needs.  [phone].    Jason Fila, NNP-BC

## 2023-07-12 ENCOUNTER — Other Ambulatory Visit: Payer: Medicaid Other

## 2023-07-14 ENCOUNTER — Ambulatory Visit (HOSPITAL_COMMUNITY): Payer: Self-pay

## 2023-07-14 NOTE — Lactation Note (Signed)
This note was copied from a baby's chart.  NICU Lactation Consultation Note  Patient Name: Rebekah [name] [id number] Date: 07/14/2023 Age:34 wk.o.  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  Mom just finished pumping, 60 ml expressed.  Mom states she expresses the most in the morning and mid nighttime sessions.  Mom does not wish to latch baby, as she feels he does better with the paced bottle feeding.  Mom states she may try later after they get home, but for now she is set on supporting her milk supply with consistent pumping.  Currently waiting on photographer that'll take pictures of "Rebekah [name]".  OBJECTIVE Infant data: No data recorded Infant feeding assessment Scale for Readiness: 2 Scale for Quality: 2   Maternal data: G3P3003 Vaginal, Spontaneous Pumping frequency: 6-8 times per 24 hrs Pumped volume: 60 mL (60-90 ml) Flange Size: 21  Pump: Personal (Evenflo (two))  ASSESSMENT Infant: Feeding Status: Scheduled 9-12-3-6  Maternal: Milk volume: Low  INTERVENTIONS/PLAN Interventions: Interventions: Breast massage; Skin to skin; Hand express; DEBP Tools: Pump; Flanges; Bottle; Hands-free pumping top Pump Education: Setup, frequency, and cleaning; Milk Storage  Plan: Consult Status: NICU follow-up NICU Follow-up type: Weekly NICU follow up   Rebekah [name] 07/14/2023, 4:24 PM

## 2023-07-15 ENCOUNTER — Encounter: Payer: Medicaid Other | Admitting: Obstetrics & Gynecology

## 2023-07-15 ENCOUNTER — Other Ambulatory Visit: Payer: Medicaid Other

## 2023-07-16 ENCOUNTER — Ambulatory Visit (INDEPENDENT_AMBULATORY_CARE_PROVIDER_SITE_OTHER): Payer: Medicaid Other | Admitting: Obstetrics & Gynecology

## 2023-07-16 VITALS — BP 133/90 | HR 82 | Ht 62.0 in | Wt 133.0 lb

## 2023-07-16 DIAGNOSIS — Z3009 Encounter for other general counseling and advice on contraception: Secondary | ICD-10-CM

## 2023-07-16 DIAGNOSIS — R030 Elevated blood-pressure reading, without diagnosis of hypertension: Secondary | ICD-10-CM

## 2023-07-16 NOTE — Progress Notes (Signed)
Follow up appointment for results: BTL consult  Chief Complaint  Patient presents with   Pre-op Exam    Discuss tubal    Blood pressure (!) 133/90, pulse 82, height 5\' 2"  (1.575 m), weight 133 lb (60.3 kg), last menstrual period 10/22/2022, currently breastfeeding.    MEDS ordered this encounter: No orders of the defined types were placed in this encounter.   Orders for this encounter: No orders of the defined types were placed in this encounter.   Impression + Management Plan   ICD-10-CM   1. Encounter for consultation for female sterilization: scheduled for 08/24/23 with Dr Charlotta Newton  Z30.09     2. Borderline hypertension, 3 weeks post delivery will not start anuthing now but monitor  R03.0       Follow Up: Return in about 7 weeks (around 09/03/2023) for Post Op with Dr Charlotta Newton.     All questions were answered.  Past Medical History:  Diagnosis Date   Chronic back pain    Chronic pelvic pain in female    Contraceptive management 01/30/2015   History of retained placenta in prior pregnancy, currently pregnant 01/14/2023   Didn't hemorrhage, removed in OR ([place], [place])   Implanon removal 01/30/2015   Nausea and vomiting during pregnancy 03/06/2015   Opioid use disorder    Ovarian cyst    Pregnant 03/06/2015   Vaginal Pap smear, abnormal     Past Surgical History:  Procedure Laterality Date   CHOLECYSTECTOMY  07/31/2011   Procedure: LAPAROSCOPIC CHOLECYSTECTOMY;  Surgeon: Fabio Bering;  Location: [facility];  Service: General;  Laterality: N/A;   DILATION AND CURETTAGE OF UTERUS N/A 06/27/2023   Procedure: DILATATION AND CURETTAGE;  Surgeon: Wyandanch Bing, MD;  Location: [facility] LD [facility]S;  Service: Gynecology;  Laterality: N/A;   placenta removed  11/08/15    OB History     Gravida  3   Para  3   Term  3   Preterm      AB      Living  3      SAB      IAB      Ectopic      Multiple      Live Births  3           No Known Allergies  Social  History   Socioeconomic History   Marital status: Single    Spouse name: Not on file   Number of children: Not on file   Years of education: Not on file   Highest education level: Not on file  Occupational History   Not on file  Tobacco Use   Smoking status: Every Day    Current packs/day: 1.00    Average packs/day: 1 pack/day for 8.0 years (8.0 ttl pk-yrs)    Types: Cigarettes   Smokeless tobacco: Never  Vaping Use   Vaping status: Former  Substance and Sexual Activity   Alcohol use: No    Alcohol/week: 1.0 standard drink of alcohol    Types: 1 Cans of beer per week    Comment: not now   Drug use: Not Currently    Types: Hydrocodone    Comment: Currently on Methadone   Sexual activity: Not Currently    Birth control/protection: None  Other Topics Concern   Not on file  Social History Narrative   Not on file   Social Determinants of Health   Financial Resource Strain: Medium Risk (12/18/2022)   Overall Financial Resource Strain (CARDIA)  Difficulty of Paying Living Expenses: Somewhat hard  Food Insecurity: No Food Insecurity (06/27/2023)   Hunger Vital Sign    Worried About Running Out of Food in the Last Year: Never true    Ran Out of Food in the Last Year: Never true  Transportation Needs: No Transportation Needs (06/27/2023)   PRAPARE - Administrator, Civil Service (Medical): No    Lack of Transportation (Non-Medical): No  Physical Activity: Inactive (12/18/2022)   Exercise Vital Sign    Days of Exercise per Week: 0 days    Minutes of Exercise per Session: 0 min  Stress: Stress Concern Present (12/18/2022)   [employer] of Occupational Health - Occupational Stress Questionnaire    Feeling of Stress : To some extent  Social Connections: Moderately Isolated (12/18/2022)   Social Connection and Isolation Panel [NHANES]    Frequency of Communication with Friends and Family: More than three times a week    Frequency of Social Gatherings with  Friends and Family: Twice a week    Attends Religious Services: Never    Database administrator or Organizations: No    Attends Engineer, structural: Not on file    Marital Status: Living with partner    Family History  Problem Relation Age of Onset   Cancer Mother        cervical   Hypertension Mother    Diabetes Mother        pre-diabetes   Cancer Maternal Aunt        cervical   Cancer Maternal Grandmother        cervical   Heart disease Maternal Grandfather    Hypertension Maternal Grandfather    Diabetes Maternal Grandfather    Hyperlipidemia Maternal Grandfather    Cancer Paternal Grandmother        cervial, breast   Diabetes Paternal Grandmother    Hypertension Paternal Grandmother

## 2023-07-17 ENCOUNTER — Ambulatory Visit (HOSPITAL_COMMUNITY): Payer: Self-pay

## 2023-07-17 NOTE — Lactation Note (Signed)
This note was copied from a baby's chart. Lactation Consultation Note  Patient Name: Rebekah [name] Today's Date: 07/17/2023 Age:34 wk.o. Reason for consult: Follow-up assessment;Exclusive pumping and bottle feeding;Term  LC in to visit with [name] of term baby transferred to Peds unit from NICU, for feeding support.  Baby is taking about 1/3 of his feedings po, the remainder by NG tube.    Baby sleeping STS on Mom's chest while Mom resting in bed.    LC asked if Mom was still pumping and if she had everything she needed.  Mom had the [employer] DEBP set up and has the washing and drying bins for the pump parts.  Mom just expressed 135 ml.    Mom knows of lactation support, and encouraged her to ask baby's RN to call lactation prn.  Lactation Tools Discussed/Used Tools: Pump;Flanges;Bottle Flange Size: 21 Breast pump type: Double-Electric Breast Pump Reason for Pumping: support full milk supply Pumping frequency: 6-8 times per 24 hrs Pumped volume: 135 mL  Interventions Interventions: Skin to skin;Breast massage;Hand express;DEBP;Education  Consult Status Consult Status: PRN Date: 07/17/23 Follow-up type: Call as needed    Judee Clara 07/17/2023, 2:58 PM

## 2023-07-27 ENCOUNTER — Encounter: Payer: Self-pay | Admitting: Obstetrics & Gynecology

## 2023-08-09 ENCOUNTER — Ambulatory Visit (INDEPENDENT_AMBULATORY_CARE_PROVIDER_SITE_OTHER): Payer: Medicaid Other | Admitting: Women's Health

## 2023-08-09 ENCOUNTER — Encounter: Payer: Self-pay | Admitting: Women's Health

## 2023-08-09 DIAGNOSIS — Z3009 Encounter for other general counseling and advice on contraception: Secondary | ICD-10-CM | POA: Diagnosis not present

## 2023-08-09 NOTE — Progress Notes (Signed)
POSTPARTUM VISIT Patient name: Rebekah [name] MRN [id number]  Date of birth: 03-23-1991 Chief Complaint:   Postpartum Care  History of Present Illness:   Rebekah [name] is a 32 y.o. G62P3003 Caucasian female being seen today for a postpartum visit. She is 6 weeks postpartum following a spontaneous vaginal delivery at 37.0 gestational weeks. IOL: yes, for fetal growth restriction . Anesthesia: epidural.  Laceration: 1st degree.  Complications: retained placenta, manual removal, then D&C, EBL , JADA placement, 4u PRBC. Inpatient contraception: no.   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 Tobacco use: yes. H/O substance use disorder: yes Last pap smear: 10/01/22 and results were ASCUS w/ HRHPV positive: 18/45, colpo CIN-2, needs repeat colpo 8wks pp.  Patient's last menstrual period was 10/22/2022.  Postpartum course has been uncomplicated. Bleeding none. Bowel function is normal. Bladder function is normal. Urinary incontinence? no, fecal incontinence? no Patient is not sexually active. Last sexual activity: prior to birth of baby. Desired contraception: Interval BTL, consent 7/19. Patient does not want a pregnancy in the future.  Desired family size is 3 children.   Upstream - 08/09/23 1411       Contraception Wrap Up   End Method Female Sterilization    Contraception Counseling Provided No            The pregnancy intention screening data noted above was reviewed. Potential methods of contraception were discussed. The patient elected to proceed with Female Sterilization.  Edinburgh Postpartum Depression Screening: negative  Edinburgh Postnatal Depression Scale - 08/09/23 1409       Edinburgh Postnatal Depression Scale:  In the Past 7 Days   I have been able to laugh and see the funny side of things. 0    I have looked forward with enjoyment to things. 0    I have blamed myself unnecessarily when things went wrong. 2    I have been anxious or worried for no good  reason. 2    I have felt scared or panicky for no good reason. 0    Things have been getting on top of me. 1    I have been so unhappy that I have had difficulty sleeping. 0    I have felt sad or miserable. 1    I have been so unhappy that I have been crying. 1    The thought of harming myself has occurred to me. 0    Edinburgh Postnatal Depression Scale Total 7                01/14/2023    8:10 AM  GAD 7 : Generalized Anxiety Score  Nervous, Anxious, on Edge 2  Control/stop worrying 2  Worry too much - different things 2  Trouble relaxing 2  Restless 1  Easily annoyed or irritable 3  Afraid - awful might happen 1  Total GAD 7 Score 13     Baby's course has been complicated by 28d NICU stay for hypoglycemia, feeding issues. Has club foot . Baby is feeding by breast and bottle: milk supply inadequate . Infant has a pediatrician/family doctor? Yes.  Childcare strategy if returning to work/school: n/a-stay at home mom.  Pt has material needs met for her and baby: Yes.   Review of Systems:   Pertinent items are noted in HPI Denies Abnormal vaginal discharge w/ itching/odor/irritation, headaches, visual changes, shortness of breath, chest pain, abdominal pain, severe nausea/vomiting, or problems with urination or bowel movements. Pertinent History Reviewed:  Reviewed past medical,surgical,  obstetrical and family history.  Reviewed problem list, medications and allergies. OB History  Gravida Para Term Preterm AB Living  3 3 3     3   SAB IAB Ectopic Multiple Live Births          3    # Outcome Date GA Lbr Len/2nd Weight Sex Type Anes PTL Lv  3 Term 06/27/23 [redacted]w[redacted]d 10:46 / 00:11 4 lb 11.5 oz (2.14 kg) M Vag-Spont EPI  LIV     Birth Comments: Left clubfoot  2 Term 11/08/15 [redacted]w[redacted]d  5 lb 3 oz (2.353 kg) F Vag-Spont None N LIV     Birth Comments: Removal of retained placenta in OR  1 Term 11/20/10 [redacted]w[redacted]d  6 lb 2 oz (2.778 kg) F Vag-Spont EPI N LIV   Physical Assessment:   Vitals:    08/09/23 1404  BP: 110/78  Pulse: (!) 107  Weight: 134 lb 4.8 oz (60.9 kg)  Height: 5\' 2"  (1.575 m)  Body mass index is 24.56 kg/m.       Physical Examination:   General appearance: alert, well appearing, and in no distress  Mental status: alert, oriented to person, place, and time  Skin: warm & dry   Cardiovascular: normal heart rate noted   Respiratory: normal respiratory effort, no distress   Breasts: deferred, no complaints   Abdomen: soft, non-tender   Pelvic: examination not indicated. Thin prep pap obtained: No  Rectal: not examined  Extremities: Edema: none   Chaperone: N/A         No results found for this or any previous visit (from the past 24 hour(s)).  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  Mood and well being:  If positive depression screen, discussed and plan developed.  If using tobacco we discussed reduction/cessation and risk of relapse If current substance abuse, we discussed and referral to local resources was offered.   2. Infant care and feeding:  If breastfeeding, discussed returning to work, pumping, breastfeeding-associated pain, guidance regarding return to fertility while lactating if not using another method. If needed, patient was provided with a letter to be allowed to pump q 2-3hrs to support lactation in a private location with access to a refrigerator to store breastmilk.   Recommended that all caregivers be immunized for flu, pertussis and other preventable communicable diseases If pt does not have material needs met for her/baby, referred to local resources for help obtaining these.  3. Sexuality, contraception and birth  spacing Provided guidance regarding sexuality, management of dyspareunia, and resumption of intercourse Discussed avoiding interpregnancy interval <85mths and recommended birth spacing of 18 months  4. Sleep and fatigue Discussed coping options for fatigue and sleep disruption Encouraged family/partner/community support of 4 hrs of uninterrupted sleep to help with mood and fatigue  5. Physical recovery  If pt had a C/S, assessed incisional pain and providing guidance on normal vs prolonged recovery If pt had a laceration, perineal healing and pain reviewed.  If urinary or fecal incontinence, discussed management and referred to PT or uro/gyn if indicated  Patient is safe to resume physical activity. Discussed attainment of healthy weight.  6.  Chronic disease management Discussed pregnancy complications if  any, and their implications for future childbearing and long-term maternal health. Review recommendations for prevention of recurrent pregnancy complications, such as 17 hydroxyprogesterone caproate to reduce risk for recurrent PTB not applicable, or aspirin to reduce risk of preeclampsia not applicable. Pt had GDM: no. If yes, 2hr GTT scheduled: not applicable. Reviewed medications and non-pregnant dosing including consideration of whether pt is breastfeeding using a reliable resource such as LactMed: yes Referred for f/u w/ PCP or subspecialist providers as indicated: not applicable  7. Health maintenance Mammogram at 34yo or earlier if indicated Pap smears as indicated  Meds: No orders of the defined types were placed in this encounter.   Follow-up: Return for As scheduled.   No orders of the defined types were placed in this encounter.   Cheral Marker CNM, [facility] 08/09/2023 2:48 PM  Discussed timing of colpo w/ Dr. Charlotta Newton after appt, best to get before BTS in case LEEP needed, will schedule this week for colpo.  Cheral Marker, CNM, [facility] 08/09/2023 5:06 PM

## 2023-08-09 NOTE — Patient Instructions (Addendum)
No sex until after surgery  Tips To Increase Milk Supply Lots of water! Enough so that your urine is clear Plenty of calories, if you're not getting enough calories, your milk supply can decrease Breastfeed/pump often, every 2-3 hours x 20-34mins Fenugreek 3 pills 3 times a day, this may make your urine smell like maple syrup Mother's Milk Tea Lactation cookies, google for the recipe Real oatmeal Body Armor sports drinks Liquid Gold Greater Than hydration drink

## 2023-08-10 ENCOUNTER — Encounter: Payer: Medicaid Other | Admitting: Obstetrics & Gynecology

## 2023-08-10 NOTE — Progress Notes (Deleted)
    Patient name: Rebekah [name] MRN [id number]  Date of birth: 1991-02-08 Chief Complaint:   No chief complaint on file.  History of Present Illness:   Rebekah [name] is a 32 y.o. G60P3003 *** female being seen today for cervical dysplasia management.  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 Patient does not desire future pregnancy and is scheduled for a salpingectomy on 8/27.  Smoker:  {yes WU:981191} New sexual partner:  {yes no:314532}   Prior cytology:   Patient's last menstrual period was 10/22/2022.     01/14/2023    8:10 AM  Depression screen PHQ 2/9  Decreased Interest 2  Down, Depressed, Hopeless 2  PHQ - 2 Score 4  Altered sleeping 2  Tired, decreased energy 2  Change in appetite 1  Feeling bad or failure about yourself  2  Trouble concentrating 2  Moving slowly or fidgety/restless 1  Suicidal thoughts 0  PHQ-9 Score 14     Review of Systems:   Pertinent items are noted in HPI Denies fever/chills, dizziness, headaches, visual disturbances, fatigue, shortness of breath, chest pain, abdominal pain, vomiting, *** problems with periods, bowel movements, urination, or intercourse unless otherwise stated above.  Pertinent History Reviewed:  Reviewed past medical,surgical, social, obstetrical and family history.  Reviewed problem list, medications and allergies. Physical Assessment:  There were no vitals filed for this visit.There is no height or weight on file to calculate BMI.       Physical Examination:   General appearance: alert, well appearing, and in no distress  Psych: mood appropriate, normal affect  Skin: warm & dry   Cardiovascular: normal heart rate noted  Respiratory: normal respiratory effort, no distress  Abdomen: soft, non-tender   Pelvic: {:315900}  Extremities: no edema   Chaperone: {Chaperone:19197::"N/A","Latisha Cresenzo","Janet Young","Amanda  Andrews","Peggy Dones","Nicole Jones","Angel Neas"}     Colposcopy Procedure Note  Indications: ***    Procedure Details  The risks and benefits of the procedure and {verbal:16408} informed consent obtained.  Speculum placed in vagina and excellent visualization of cervix achieved, cervix swabbed x 3 with acetic acid solution.  Findings: Adequate colposcopy is noted today.  TMZ zone ***  Cervix: {Findings; colposcopy:728}; ECC and cervical biopsies obtained.    Monsel's applied.  Adequate hemostasis noted  Specimens: ***  Complications: {complications:13423}.  Colposcopic Impression:   Plan(Based on 2019 ASCCP recommendations)  -Discussed HPV- reviewed incidence and its potential to cause condylomas to dysplasia to cervical cancer -Reviewed degree of abnormal pap smears  -Discussed ASCCP guidelines and current recommendations for colposcopy -As above, inform consent obtained and procedure completed -biopsies obtained, further management pending results -Questions and concerns were addressed  Myna Hidalgo, DO Attending Obstetrician & Gynecologist, Faculty Practice Center for [facility], [facility]

## 2023-08-11 ENCOUNTER — Other Ambulatory Visit (HOSPITAL_COMMUNITY)
Admission: RE | Admit: 2023-08-11 | Payer: Medicaid Other | Source: Ambulatory Visit | Admitting: Obstetrics & Gynecology

## 2023-08-11 ENCOUNTER — Encounter: Payer: Self-pay | Admitting: Obstetrics & Gynecology

## 2023-08-11 ENCOUNTER — Ambulatory Visit (INDEPENDENT_AMBULATORY_CARE_PROVIDER_SITE_OTHER): Payer: Medicaid Other | Admitting: Obstetrics & Gynecology

## 2023-08-11 VITALS — BP 123/81 | HR 110 | Ht 62.0 in | Wt 134.0 lb

## 2023-08-11 DIAGNOSIS — Z3009 Encounter for other general counseling and advice on contraception: Secondary | ICD-10-CM | POA: Diagnosis not present

## 2023-08-11 DIAGNOSIS — N871 Moderate cervical dysplasia: Secondary | ICD-10-CM | POA: Diagnosis not present

## 2023-08-11 DIAGNOSIS — Z3202 Encounter for pregnancy test, result negative: Secondary | ICD-10-CM

## 2023-08-11 LAB — POCT URINE PREGNANCY: Preg Test, Ur: NEGATIVE

## 2023-08-11 NOTE — Progress Notes (Signed)
    Patient name: TAQUILLA ECHEVERRY MRN [id number]  Date of birth: 04/10/1991 Chief Complaint:   Colposcopy  History of Present Illness:   Rebekah [name] is a 32 y.o. G25P3003 female being seen today for cervical dysplasia management.  Smoker:  Yes.    Prior Pap was abnormal, with colposcopy of CIN-2.  At that time the patient desired another pregnancy and the plan was for conservative management.  She is now postpartum and presents for cervical dysplasia follow-up.  Patient is scheduled for upcoming robotic assisted bilateral salpingectomy.  Should high-grade dysplasia be noted ultimately the plan will be to add the excisional procedure to her upcoming surgery.  Denies irregular bleeding.  Denies irregular discharge, itching or irritation.  Reports no acute GYN concerns  Patient's last menstrual period was 10/22/2022.     01/14/2023    8:10 AM  Depression screen PHQ 2/9  Decreased Interest 2  Down, Depressed, Hopeless 2  PHQ - 2 Score 4  Altered sleeping 2  Tired, decreased energy 2  Change in appetite 1  Feeling bad or failure about yourself  2  Trouble concentrating 2  Moving slowly or fidgety/restless 1  Suicidal thoughts 0  PHQ-9 Score 14     Review of Systems:   Pertinent items are noted in HPI Denies fever/chills, dizziness, headaches, visual disturbances, fatigue, shortness of breath, chest pain, abdominal pain, vomiting, no problems with periods, bowel movements, urination, or intercourse unless otherwise stated above.  Pertinent History Reviewed:  Reviewed past medical,surgical, social, obstetrical and family history.  Reviewed problem list, medications and allergies. Physical Assessment:   Vitals:   08/11/23 1400  BP: 123/81  Pulse: (!) 110  Weight: 134 lb (60.8 kg)  Height: 5\' 2"  (1.575 m)  Body mass index is 24.51 kg/m.       Physical Examination:   General appearance: alert, well appearing, and in no distress  Psych: mood appropriate, normal  affect  Skin: warm & dry   Cardiovascular: normal heart rate noted  Respiratory: normal respiratory effort, no distress  Abdomen: soft, non-tender   Pelvic: VULVA: normal appearing vulva with no masses, tenderness or lesions, VAGINA: normal appearing vagina with normal color and discharge, no lesions, CERVIX: normal appearing cervix without discharge or lesions- see colposcopy section  Extremities: no edema   Chaperone: Faith Rogue     Colposcopy Procedure Note  Indications: CIN 2    Procedure Details  The risks and benefits of the procedure and Written informed consent obtained.  Speculum placed in vagina and excellent visualization of cervix achieved, cervix swabbed x 3 with acetic acid solution.  Findings: Adequate colposcopy is noted today.  TMZ zone present  Cervix: acetowhite lesion(s) noted at anterior portion from 10-3 o'clock; ECC and cervical biopsies obtained.    Monsel's applied.  Adequate hemostasis noted  Specimens: ECC and cervical biopsies  Complications: none.  Colposcopic Impression: CIN 2   Plan(Based on 2019 ASCCP recommendations)  -Discussed HPV- reviewed incidence and its potential to cause condylomas to dysplasia to cervical cancer -Reviewed degree of abnormal pap smears  -Discussed ASCCP guidelines and current recommendations for colposcopy -As above, inform consent obtained and procedure completed -biopsies obtained, further management pending results -Questions and concerns were addressed  Reviewed upcoming procedure, hospital expectations and recovery Pending pathology will add excisional procedure to surgery  Myna Hidalgo, DO Attending Obstetrician & Gynecologist, [facility] for [facility], [facility]

## 2023-08-17 LAB — SURGICAL PATHOLOGY

## 2023-08-18 NOTE — Pre-Procedure Instructions (Signed)
Dr Johnnette Litter aware of patient taking methadone and he is okay for her to take it the morning of surgery.

## 2023-08-18 NOTE — Patient Instructions (Signed)
Rebekah [name]  08/18/2023     @PREFPERIOPPHARMACY @   Your procedure is scheduled on  08/24/2023.   Report to [facility] at  0840  A.M.   Call this number if you have problems the morning of surgery:  [phone]  If you experience any cold or flu symptoms such as cough, fever, chills, shortness of breath, etc. between now and your scheduled surgery, please notify us at the above number.   Remember:  Do not eat or drink after midnight.      Take these medicines the morning of surgery with A SIP OF WATER                                          None.    Do not wear jewelry, make-up or nail polish, including gel polish,  artificial nails, or any other type of covering on natural nails (fingers and  toes).  Do not wear lotions, powders, or perfumes, or deodorant.  Do not shave 48 hours prior to surgery.  Men may shave face and neck.  Do not bring valuables to the hospital.  [facility] is not responsible for any belongings or valuables.  Contacts, dentures or bridgework may not be worn into surgery.  Leave your suitcase in the car.  After surgery it may be brought to your room.  For patients admitted to the hospital, discharge time will be determined by your treatment team.  Patients discharged the day of surgery will not be allowed to drive home and must have someone with them for 24 hours.    Special instructions:   DO NOT smoke tobacco or vape for 24 hours before your procedure.  Please read over the following fact sheets that you were given. Coughing and Deep Breathing, Surgical Site Infection Prevention, Anesthesia Post-op Instructions, and Care and Recovery After Surgery      LEEP POST-PROCEDURE INSTRUCTIONS  You may take Ibuprofen, Aleve or Tylenol for pain if needed.  Cramping is normal.  You will have black and/or bloody discharge at first.  This will lighten and then turn clear before completely resolving.  This will take 2 to 3 weeks.  Put  nothing in your vagina until the bleeding or discharge stops (usually 2 or3 days).  You need to call if you have redness around the biopsy site, if there is any unusual draining, if the bleeding is heavy, or if you are concerned.  Shower or bathe as normal  We will call you within one week with results or we will discuss the results at your follow-up appointment if needed.  You will need to return for a follow-up Pap smear as directed by your physician.Salpingectomy, Care After The following information offers guidance on how to care for yourself after your procedure. Your health care provider may also give you more specific instructions. If you have problems or questions, contact your health care provider. What can I expect after the procedure? After the procedure, it is common to have: Pain in your abdomen. Light vaginal bleeding (spotting) for a few days. Tiredness. Your recovery time will depend on which method was used for your surgery. Follow these instructions at home: Medicines Take over-the-counter and prescription medicines only as told by your health care provider. Ask your health care provider if the medicine prescribed to you: Requires you to  avoid driving or using machinery. Can cause constipation. You may need to take actions to prevent or treat constipation, such as: Drink enough fluid to keep your urine pale yellow. Take over-the-counter or prescription medicines. Eat foods that are high in fiber, such as beans, whole grains, and fresh fruits and vegetables. Limit foods that are high in fat and processed sugars, such as fried or sweet foods. Incision care  Follow instructions from your health care provider about how to take care of your incision or incisions. Make sure you: Wash your hands with soap and water for at least 20 seconds before and after you change your bandage (dressing). If soap and water are not available, use hand sanitizer. Change or remove your  dressing as told by your health care provider. Leave stitches (sutures), skin glue, staples, or adhesive strips in place. These skin closures may need to stay in place for 2 weeks or longer. If adhesive strip edges start to loosen and curl up, you may trim the loose edges. Do not remove adhesive strips completely unless your health care provider tells you to do that. Keep your dressing clean and dry. Check your incision area every day for signs of infection. Check for: Redness, swelling, or pain that gets worse. Fluid or blood. Warmth. Pus or a bad smell. Activity Rest as told by your health care provider. Avoid sitting for a long time without moving. Get up to take short walks every 1-2 hours. This is important to improve blood flow and breathing. Ask for help if you feel weak or unsteady. Return to your normal activities as told by your health care provider. Ask your health care provider what activities are safe for you. Do not drive until your health care provider says that it is safe. Do not lift anything that is heavier than 10 lb (4.5 kg), or the limit that you are told, until your health care provider says that it is safe. This may last for 2-6 weeks depending on your surgery. Do not douche, use tampons, or have sex until your health care provider approves. General instructions Do not use any products that contain nicotine or tobacco. These products include cigarettes, chewing tobacco, and vaping devices, such as e-cigarettes. These can delay healing after surgery. If you need help quitting, ask your health care provider. Wear compression stockings as told by your health care provider. These stockings help to prevent blood clots and reduce swelling in your legs. Do not take baths, swim, or use a hot tub until your health care provider approves. You may take showers. Keep all follow-up visits. This is important. Contact a health care provider if: You have pain when you urinate. You have  redness, swelling, or more pain around an incision or an incision feels warm to the touch. You have pus, fluid, blood, or a bad smell coming from an incision or an incision starts to open. You have a fever. You have abdominal pain that gets worse or does not get better with medicine. You have a rash. You feel light-headed, have nausea and vomiting, or both. Get help right away if: You have pain in your chest or leg. You develop shortness of breath. You faint. You have increased or heavy vaginal bleeding, such as soaking a sanitary napkin in an hour. These symptoms may represent a serious problem that is an emergency. Do not wait to see if the symptoms will go away. Get medical help right away. Call your local emergency services (911 in the  [place] Do not drive yourself to the hospital. Summary After the procedure, it is common to feel tired, have pain in your abdomen, and have light vaginal bleeding for a few days. Follow instructions from your health care provider about how to take care of your incision or incisions. Return to your normal activities as told by your health care provider. Ask your health care provider what activities are safe for you. Do not douche, use tampons, or have sex until your health care provider approves. Keep all follow-up visits. This is important. This information is not intended to replace advice given to you by your health care provider. Make sure you discuss any questions you have with your health care provider. Document Revised: 11/04/2020 Document Reviewed: 11/05/2020 Elsevier Patient Education  2024 [employer] General Anesthesia, Adult, Care After The following information offers guidance on how to care for yourself after your procedure. Your health care provider may also give you more specific instructions. If you have problems or questions, contact your health care provider. What can I expect after the procedure? After the procedure, it is common for  people to: Have pain or discomfort at the IV site. Have nausea or vomiting. Have a sore throat or hoarseness. Have trouble concentrating. Feel cold or chills. Feel weak, sleepy, or tired (fatigue). Have soreness and body aches. These can affect parts of the body that were not involved in surgery. Follow these instructions at home: For the time period you were told by your health care provider:  Rest. Do not participate in activities where you could fall or become injured. Do not drive or use machinery. Do not drink alcohol. Do not take sleeping pills or medicines that cause drowsiness. Do not make important decisions or sign legal documents. Do not take care of children on your own. General instructions Drink enough fluid to keep your urine pale yellow. If you have sleep apnea, surgery and certain medicines can increase your risk for breathing problems. Follow instructions from your health care provider about wearing your sleep device: Anytime you are sleeping, including during daytime naps. While taking prescription pain medicines, sleeping medicines, or medicines that make you drowsy. Return to your normal activities as told by your health care provider. Ask your health care provider what activities are safe for you. Take over-the-counter and prescription medicines only as told by your health care provider. Do not use any products that contain nicotine or tobacco. These products include cigarettes, chewing tobacco, and vaping devices, such as e-cigarettes. These can delay incision healing after surgery. If you need help quitting, ask your health care provider. Contact a health care provider if: You have nausea or vomiting that does not get better with medicine. You vomit every time you eat or drink. You have pain that does not get better with medicine. You cannot urinate or have bloody urine. You develop a skin rash. You have a fever. Get help right away if: You have trouble  breathing. You have chest pain. You vomit blood. These symptoms may be an emergency. Get help right away. Call 911. Do not wait to see if the symptoms will go away. Do not drive yourself to the hospital. Summary After the procedure, it is common to have a sore throat, hoarseness, nausea, vomiting, or to feel weak, sleepy, or fatigue. For the time period you were told by your health care provider, do not drive or use machinery. Get help right away if you have difficulty breathing, have chest pain, or vomit blood.  These symptoms may be an emergency. This information is not intended to replace advice given to you by your health care provider. Make sure you discuss any questions you have with your health care provider. Document Revised: 03/13/2022 Document Reviewed: 03/13/2022 Elsevier Patient Education  2024 [employer]. How to Use Chlorhexidine Before Surgery Chlorhexidine gluconate (CHG) is a germ-killing (antiseptic) solution that is used to clean the skin. It can get rid of the bacteria that normally live on the skin and can keep them away for about 24 hours. To clean your skin with CHG, you may be given: A CHG solution to use in the shower or as part of a sponge bath. A prepackaged cloth that contains CHG. Cleaning your skin with CHG may help lower the risk for infection: While you are staying in the intensive care unit of the hospital. If you have a vascular access, such as a central line, to provide short-term or long-term access to your veins. If you have a catheter to drain urine from your bladder. If you are on a ventilator. A ventilator is a machine that helps you breathe by moving air in and out of your lungs. After surgery. What are the risks? Risks of using CHG include: A skin reaction. Hearing loss, if CHG gets in your ears and you have a perforated eardrum. Eye injury, if CHG gets in your eyes and is not rinsed out. The CHG product catching fire. Make sure that you avoid  smoking and flames after applying CHG to your skin. Do not use CHG: If you have a chlorhexidine allergy or have previously reacted to chlorhexidine. On babies younger than 71 months of age. How to use CHG solution Use CHG only as told by your health care provider, and follow the instructions on the label. Use the full amount of CHG as directed. Usually, this is one bottle. During a shower Follow these steps when using CHG solution during a shower (unless your health care provider gives you different instructions): Start the shower. Use your normal soap and shampoo to wash your face and hair. Turn off the shower or move out of the shower stream. Pour the CHG onto a clean washcloth. Do not use any type of brush or rough-edged sponge. Starting at your neck, lather your body down to your toes. Make sure you follow these instructions: If you will be having surgery, pay special attention to the part of your body where you will be having surgery. Scrub this area for at least 1 minute. Do not use CHG on your head or face. If the solution gets into your ears or eyes, rinse them well with water. Avoid your genital area. Avoid any areas of skin that have broken skin, cuts, or scrapes. Scrub your back and under your arms. Make sure to wash skin folds. Let the lather sit on your skin for 1-2 minutes or as long as told by your health care provider. Thoroughly rinse your entire body in the shower. Make sure that all body creases and crevices are rinsed well. Dry off with a clean towel. Do not put any substances on your body afterward--such as powder, lotion, or perfume--unless you are told to do so by your health care provider. Only use lotions that are recommended by the manufacturer. Put on clean clothes or pajamas. If it is the night before your surgery, sleep in clean sheets.  During a sponge bath Follow these steps when using CHG solution during a sponge bath (unless your health  care provider gives  you different instructions): Use your normal soap and shampoo to wash your face and hair. Pour the CHG onto a clean washcloth. Starting at your neck, lather your body down to your toes. Make sure you follow these instructions: If you will be having surgery, pay special attention to the part of your body where you will be having surgery. Scrub this area for at least 1 minute. Do not use CHG on your head or face. If the solution gets into your ears or eyes, rinse them well with water. Avoid your genital area. Avoid any areas of skin that have broken skin, cuts, or scrapes. Scrub your back and under your arms. Make sure to wash skin folds. Let the lather sit on your skin for 1-2 minutes or as long as told by your health care provider. Using a different clean, wet washcloth, thoroughly rinse your entire body. Make sure that all body creases and crevices are rinsed well. Dry off with a clean towel. Do not put any substances on your body afterward--such as powder, lotion, or perfume--unless you are told to do so by your health care provider. Only use lotions that are recommended by the manufacturer. Put on clean clothes or pajamas. If it is the night before your surgery, sleep in clean sheets. How to use CHG prepackaged cloths Only use CHG cloths as told by your health care provider, and follow the instructions on the label. Use the CHG cloth on clean, dry skin. Do not use the CHG cloth on your head or face unless your health care provider tells you to. When washing with the CHG cloth: Avoid your genital area. Avoid any areas of skin that have broken skin, cuts, or scrapes. Before surgery Follow these steps when using a CHG cloth to clean before surgery (unless your health care provider gives you different instructions): Using the CHG cloth, vigorously scrub the part of your body where you will be having surgery. Scrub using a back-and-forth motion for 3 minutes. The area on your body should be  completely wet with CHG when you are done scrubbing. Do not rinse. Discard the cloth and let the area air-dry. Do not put any substances on the area afterward, such as powder, lotion, or perfume. Put on clean clothes or pajamas. If it is the night before your surgery, sleep in clean sheets.  For general bathing Follow these steps when using CHG cloths for general bathing (unless your health care provider gives you different instructions). Use a separate CHG cloth for each area of your body. Make sure you wash between any folds of skin and between your fingers and toes. Wash your body in the following order, switching to a new cloth after each step: The front of your neck, shoulders, and chest. Both of your arms, under your arms, and your hands. Your stomach and groin area, avoiding the genitals. Your right leg and foot. Your left leg and foot. The back of your neck, your back, and your buttocks. Do not rinse. Discard the cloth and let the area air-dry. Do not put any substances on your body afterward--such as powder, lotion, or perfume--unless you are told to do so by your health care provider. Only use lotions that are recommended by the manufacturer. Put on clean clothes or pajamas. Contact a health care provider if: Your skin gets irritated after scrubbing. You have questions about using your solution or cloth. You swallow any chlorhexidine. Call your local poison control center ([phone] in  the [place] Get help right away if: Your eyes itch badly, or they become very red or swollen. Your skin itches badly and is red or swollen. Your hearing changes. You have trouble seeing. You have swelling or tingling in your mouth or throat. You have trouble breathing. These symptoms may represent a serious problem that is an emergency. Do not wait to see if the symptoms will go away. Get medical help right away. Call your local emergency services (911 in the [place] Do not drive yourself to the  hospital. Summary Chlorhexidine gluconate (CHG) is a germ-killing (antiseptic) solution that is used to clean the skin. Cleaning your skin with CHG may help to lower your risk for infection. You may be given CHG to use for bathing. It may be in a bottle or in a prepackaged cloth to use on your skin. Carefully follow your health care provider's instructions and the instructions on the product label. Do not use CHG if you have a chlorhexidine allergy. Contact your health care provider if your skin gets irritated after scrubbing. This information is not intended to replace advice given to you by your health care provider. Make sure you discuss any questions you have with your health care provider. Document Revised: 04/13/2022 Document Reviewed: 02/24/2021 Elsevier Patient Education  2023 [employer].

## 2023-08-20 ENCOUNTER — Encounter (HOSPITAL_COMMUNITY)
Admission: RE | Admit: 2023-08-20 | Discharge: 2023-08-20 | Disposition: A | Discharge status: Home / Self Care | Payer: Medicaid Other | Source: Ambulatory Visit | Attending: Obstetrics & Gynecology | Admitting: Obstetrics & Gynecology

## 2023-08-20 DIAGNOSIS — Z3009 Encounter for other general counseling and advice on contraception: Secondary | ICD-10-CM

## 2023-08-20 DIAGNOSIS — Z01818 Encounter for other preprocedural examination: Secondary | ICD-10-CM

## 2023-08-20 DIAGNOSIS — F1990 Other psychoactive substance use, unspecified, uncomplicated: Secondary | ICD-10-CM | POA: Diagnosis not present

## 2023-08-20 DIAGNOSIS — I4510 Unspecified right bundle-branch block: Secondary | ICD-10-CM | POA: Diagnosis not present

## 2023-08-20 DIAGNOSIS — R87613 High grade squamous intraepithelial lesion on cytologic smear of cervix (HGSIL): Secondary | ICD-10-CM | POA: Insufficient documentation

## 2023-08-20 DIAGNOSIS — R000 Tachycardia, unspecified: Secondary | ICD-10-CM | POA: Insufficient documentation

## 2023-08-20 LAB — RAPID URINE DRUG SCREEN, HOSP PERFORMED
Amphetamines: NOT DETECTED
Barbiturates: NOT DETECTED
Benzodiazepines: NOT DETECTED
Cocaine: NOT DETECTED
Opiates: NOT DETECTED
Tetrahydrocannabinol: NOT DETECTED

## 2023-08-20 LAB — CBC
HCT: 39.3 % (ref 36.0–46.0)
Hemoglobin: 12.1 g/dL (ref 12.0–15.0)
MCH: 28.7 pg (ref 26.0–34.0)
MCHC: 30.8 g/dL (ref 30.0–36.0)
MCV: 93.1 fL (ref 80.0–100.0)
Platelets: 329 10*3/uL (ref 150–400)
RBC: 4.22 MIL/uL (ref 3.87–5.11)
RDW: 13.3 % (ref 11.5–15.5)
WBC: 8.5 10*3/uL (ref 4.0–10.5)
nRBC: 0 % (ref 0.0–0.2)

## 2023-08-20 LAB — PREGNANCY, URINE: Preg Test, Ur: NEGATIVE

## 2023-08-22 NOTE — H&P (Signed)
[facility]ulty [facility] Obstetrics and Gynecology Attending History and Physical  Rebekah [name] is a 32 y.o. G3P3003 at [redacted]w[redacted]d who presents for scheduled robotic assisted bilateral salpingectomy, LEEP  In review, patient has completed childbearing and desires permanent sterilization.  Cervical dysplasia: Colposcopy 8/14: CIN 2/3 @ 4 and 7 oclock Prior colpo CIN 2, pap: ASCUS, HPV 18/45 positive  Denies any abnormal vaginal discharge, fevers, chills, sweats, dysuria, nausea, vomiting, other GI or GU symptoms or other general symptoms. ***  Past Medical History:  Diagnosis Date   Chronic back pain    Chronic pelvic pain in female    Contraceptive management 01/30/2015   History of retained placenta in prior pregnancy, currently pregnant 01/14/2023   Didn't hemorrhage, removed in OR ([place], [place])   Implanon removal 01/30/2015   Nausea and vomiting during pregnancy 03/06/2015   Opioid use disorder    Ovarian cyst    Pregnant 03/06/2015   Vaginal Pap smear, abnormal    Past Surgical History:  Procedure Laterality Date   CHOLECYSTECTOMY  07/31/2011   Procedure: LAPAROSCOPIC CHOLECYSTECTOMY;  Surgeon: Fabio Bering;  Location: [facility];  Service: General;  Laterality: N/A;   DILATION AND CURETTAGE OF UTERUS N/A 06/27/2023   Procedure: DILATATION AND CURETTAGE;  Surgeon: Wahia[place] Bing, MD;  Location: [facility];  Service: Gynecology;  Laterality: N/A;   placenta removed  11/08/15   OB History  Gravida Para Term Preterm AB Living  3 3 3     3   SAB IAB Ectopic Multiple Live Births          3    # Outcome Date GA Lbr Len/2nd Weight Sex Type Anes PTL Lv  3 Term 06/27/23 [redacted]w[redacted]d 10:46 / 00:11 2140 g M Vag-Spont EPI  LIV     Birth Comments: Left clubfoot  2 Term 11/08/15 [redacted]w[redacted]d  2353 g F Vag-Spont None N LIV     Birth Comments: Removal of retained placenta in OR  1 Term 11/20/10 [redacted]w[redacted]d  2778 g F Vag-Spont EPI N LIV  Patient denies any other pertinent gynecologic issues.  Current  Facility-Administered Medications on File Prior to Encounter  Medication Dose Route Frequency Provider Last Rate Last Admin   sodium chloride 0.9 % irrigation    PRN Tilford Pillar, MD   500 mL at 07/31/11 1100   Current Outpatient Medications on File Prior to Encounter  Medication Sig Dispense Refill   diphenhydramine-acetaminophen (TYLENOL PM) 25-500 MG TABS tablet Take 2 tablets by mouth at bedtime as needed (sleep).     methadone (DOLOPHINE) 10 MG/ML solution Take 110 mg by mouth daily.     Prenatal Vit-Fe Fumarate-FA (PRENATAL VITAMIN PO) Take 1 tablet by mouth daily.     No Known Allergies  Social History:   reports that she has been smoking cigarettes. She has a 8 pack-year smoking history. She has never used smokeless tobacco. She reports that she does not currently use drugs after having used the following drugs: Hydrocodone. She reports that she does not drink alcohol. Family History  Problem Relation Age of Onset   Cancer Mother        cervical   Hypertension Mother    Diabetes Mother        pre-diabetes   Cancer Maternal Aunt        cervical   Cancer Maternal Grandmother        cervical   Heart disease Maternal Grandfather    Hypertension Maternal Grandfather    Diabetes Maternal Grandfather  Hyperlipidemia Maternal Grandfather    Cancer Paternal Grandmother        cervial, breast   Diabetes Paternal Grandmother    Hypertension Paternal Grandmother     Review of Systems: Pertinent items noted in HPI and remainder of comprehensive ROS otherwise negative.  PHYSICAL EXAM: Last menstrual period 10/22/2022, currently breastfeeding. CONSTITUTIONAL: Well-developed, well-nourished female in no acute distress.  SKIN: Skin is warm and dry. No rash noted. Not diaphoretic. No erythema. No pallor. NEUROLOGIC: Alert and oriented to person, place, and time. Normal reflexes, muscle tone coordination. No cranial nerve deficit noted. PSYCHIATRIC: Normal mood and affect. Normal  behavior. Normal judgment and thought content. CARDIOVASCULAR: Normal heart rate noted, regular rhythm RESPIRATORY: Effort and breath sounds normal, no problems with respiration noted ABDOMEN: Soft, nontender, nondistended. PELVIC: deferred MUSCULOSKELETAL: no calf tenderness bilaterally EXT: no edema bilaterally, normal pulses  Labs: Results for orders placed or performed during the hospital encounter of 08/20/23 (from the past 336 hour(s))  Rapid Urine Drug Screen (hospital performed - Not at [facility])   Collection Time: 08/20/23  8:46 AM  Result Value Ref Range   Opiates NONE DETECTED NONE DETECTED   Cocaine NONE DETECTED NONE DETECTED   Benzodiazepines NONE DETECTED NONE DETECTED   Amphetamines NONE DETECTED NONE DETECTED   Tetrahydrocannabinol NONE DETECTED NONE DETECTED   Barbiturates NONE DETECTED NONE DETECTED  CBC   Collection Time: 08/20/23  8:46 AM  Result Value Ref Range   WBC 8.5 4.0 - 10.5 K/uL   RBC 4.22 3.87 - 5.11 MIL/uL   Hemoglobin 12.1 12.0 - 15.0 g/dL   HCT 86.5 78.4 - 69.6 %   MCV 93.1 80.0 - 100.0 fL   MCH 28.7 26.0 - 34.0 pg   MCHC 30.8 30.0 - 36.0 g/dL   RDW 29.5 28.4 - 13.2 %   Platelets 329 150 - 400 K/uL   nRBC 0.0 0.0 - 0.2 %  Pregnancy, urine   Collection Time: 08/20/23  8:46 AM  Result Value Ref Range   Preg Test, Ur NEGATIVE NEGATIVE  Results for orders placed or performed in visit on 08/11/23 (from the past 336 hour(s))  POCT urine pregnancy   Collection Time: 08/11/23  1:59 PM  Result Value Ref Range   Preg Test, Ur Negative Negative  Surgical pathology( [place]/ [facility])   Collection Time: 08/11/23  2:16 PM  Result Value Ref Range   SURGICAL PATHOLOGY      SURGICAL PATHOLOGY CASE: [id number] PATIENT: Laynee Auerbach Surgical Pathology Report     Clinical History: dysplasia of cervix, high grade CIN 2 (cm)     FINAL MICROSCOPIC DIAGNOSIS:  A. ENDOCERVIX, CURETTAGE: - Benign cervical glandular mucosa - Negative for  dysplasia or malignancy  B. CERVIX, 4 O'CLOCK, BIOPSY: - High-grade squamous intraepithelial lesion (CIN2-3, high grade dysplasia), see comment  C. CERVIX, 7 O'CLOCK, BIOPSY: - High-grade squamous intraepithelial lesion (CIN2-3, high grade dysplasia), see comment  D. CERVIX, 10 O'CLOCK, BIOPSY: - Benign cervical glandular mucosa - Negative for dysplasia or malignancy  E. CERVIX, 2 O'CLOCK, BIOPSY: - Low-grade squamous intraepithelial lesion (CIN1, low grade dysplasia)     COMMENT:  B and C. Immunostains for p16 show full thickness block-like staining, consistent with above interpretation.     GROSS DESCRIPTION:  A: Specimen is received in formalin on a wire brush, and consists of a  1.8 x 1.3 x 0.4 cm aggregate of tan-white mucus.  Specimen is entirely submitted in 1 cassette.  B: Specimen is received in formalin  and consists of a 0.4 cm piece of tan-white soft tissue.  Specimen is entirely submitted in 1 cassette.  C: Specimen is received in formalin and consists of a 0.7 cm piece of tan-red soft tissue.  Specimen is entirely submitted in 1 cassette.  D: Specimen is received in formalin and consists of a 1.0 x 0.8 x 0.3 cm aggregate of clear mucus.  Specimen is entirely submitted in 1 cassette.  E: Specimen is received in formalin and consists of a 0.8 cm aggregate of tan soft tissue and mucus.  Specimen is entirely submitted in 1 cassette.  Lovey Newcomer 08/12/2023)    Final Diagnosis performed by Holley Bouche, MD.   Electronically signed 08/17/2023 Technical component performed at [facility]. [facility], [address], [place], [place] [address].  Professional component performed at [facility], [address],  [place], [place] [address].  Immunohistochemistry Technical component (if applicable) was performed at [facility]. [address], [place], [place] [address].   IMMUNOHISTOCHEMISTRY DISCLAIMER (if  applicable): Some of these immunohistochemical stains may have been developed and the performance characteristics determine by [facility]. Some may not have been cleared or approved by the U.S. Food and Drug Administration. The FDA has determined that such clearance or approval is not necessary. This test is used for clinical purposes. It should not be regarded as investigational or for research. This laboratory is certified under the Clinical Laboratory Improvement Amendments of 1988 (CLIA-88) as qualified to perform high complexity clinical laboratory testing.  The controls stained appropriately.   IHC stains are performed on formalin fixed, paraffin embedded tissue using a 3,3"diaminobenzidine (DAB) chromogen and Leica Bond Autostainer Sys tem. The staining intensity of the nucleus is score manually and is reported as the percentage of tumor cell nuclei demonstrating specific nuclear staining. The specimens are fixed in 10% Neutral Formalin for at least 6 hours and up to 72hrs. These tests are validated on decalcified tissue. Results should be interpreted with caution given the possibility of false negative results on decalcified specimens. Antibody Clones are as follows ER-clone 31F, PR-clone 16, Ki67- clone MM1. Some of these immunohistochemical stains may have been developed and the performance characteristics determined by [facility].     Assessment: Desires sterilization High-grade cervical dysplasia  Plan: Robotic assisted laparoscopic bilateral salpingectomy, LEEP -NPO -IV Toradol -LR @ 125cc/hr -SCDs to OR -Risk/benefits and alternatives reviewed with the patient including but not limited to risk of bleeding, infection and injury to surrounding organs.  Questions and concerns were addressed and pt desires to proceed  Myna Hidalgo, DO Attending Obstetrician & Gynecologist, [facility] for [facility], [facility]

## 2023-08-24 ENCOUNTER — Ambulatory Visit (HOSPITAL_COMMUNITY): Payer: Medicaid Other | Admitting: Certified Registered"

## 2023-08-24 ENCOUNTER — Encounter (HOSPITAL_COMMUNITY): Payer: Self-pay | Admitting: Obstetrics & Gynecology

## 2023-08-24 ENCOUNTER — Ambulatory Visit (HOSPITAL_BASED_OUTPATIENT_CLINIC_OR_DEPARTMENT_OTHER): Payer: Medicaid Other | Admitting: Certified Registered"

## 2023-08-24 ENCOUNTER — Encounter (HOSPITAL_COMMUNITY)
Admission: RE | Disposition: A | Discharge status: Home / Self Care | Payer: Self-pay | Source: Home / Self Care | Attending: Obstetrics & Gynecology

## 2023-08-24 ENCOUNTER — Ambulatory Visit (HOSPITAL_COMMUNITY)
Admission: RE | Admit: 2023-08-24 | Discharge: 2023-08-24 | Disposition: A | Discharge status: Home / Self Care | Payer: Medicaid Other | Attending: Obstetrics & Gynecology | Admitting: Obstetrics & Gynecology

## 2023-08-24 DIAGNOSIS — R87613 High grade squamous intraepithelial lesion on cytologic smear of cervix (HGSIL): Secondary | ICD-10-CM | POA: Diagnosis not present

## 2023-08-24 DIAGNOSIS — K22711 Barrett's esophagus with high grade dysplasia: Secondary | ICD-10-CM | POA: Diagnosis not present

## 2023-08-24 DIAGNOSIS — F17210 Nicotine dependence, cigarettes, uncomplicated: Secondary | ICD-10-CM | POA: Diagnosis not present

## 2023-08-24 DIAGNOSIS — Z302 Encounter for sterilization: Secondary | ICD-10-CM | POA: Diagnosis present

## 2023-08-24 DIAGNOSIS — Z3009 Encounter for other general counseling and advice on contraception: Secondary | ICD-10-CM

## 2023-08-24 DIAGNOSIS — N871 Moderate cervical dysplasia: Secondary | ICD-10-CM | POA: Diagnosis not present

## 2023-08-24 DIAGNOSIS — Z01818 Encounter for other preprocedural examination: Secondary | ICD-10-CM

## 2023-08-24 HISTORY — PX: XI ROBOTIC ASSISTED SALPINGECTOMY: SHX6824

## 2023-08-24 HISTORY — PX: LEEP: SHX91

## 2023-08-24 SURGERY — SALPINGECTOMY, ROBOT-ASSISTED
Anesthesia: General

## 2023-08-24 MED ORDER — IBUPROFEN 600 MG PO TABS: 600.0000 mg | ORAL_TABLET | Freq: Four times a day (QID) | ORAL | 0 refills | Status: DC | PRN

## 2023-08-24 MED ORDER — OXYCODONE HCL 5 MG/5ML PO SOLN: 5.0000 mg | Freq: Once | ORAL | Status: AC | PRN

## 2023-08-24 MED ORDER — SUGAMMADEX SODIUM 200 MG/2ML IV SOLN
INTRAVENOUS | Status: DC | PRN
  Administered 2023-08-24: 200 mg via INTRAVENOUS

## 2023-08-24 MED ORDER — LACTATED RINGERS IV SOLN: INTRAVENOUS | Status: DC

## 2023-08-24 MED ORDER — ONDANSETRON HCL 4 MG/2ML IJ SOLN
INTRAMUSCULAR | Status: DC | PRN
  Administered 2023-08-24: 4 mg via INTRAVENOUS

## 2023-08-24 MED ORDER — MONSELS FERRIC SUBSULFATE EX SOLN
CUTANEOUS | Status: DC | PRN
  Administered 2023-08-24: 1 via TOPICAL

## 2023-08-24 MED ORDER — ROCURONIUM BROMIDE 100 MG/10ML IV SOLN
INTRAVENOUS | Status: DC | PRN
  Administered 2023-08-24: 50 mg via INTRAVENOUS

## 2023-08-24 MED ORDER — DEXAMETHASONE SODIUM PHOSPHATE 10 MG/ML IJ SOLN
INTRAMUSCULAR | Status: DC | PRN
  Administered 2023-08-24: 5 mg via INTRAVENOUS

## 2023-08-24 MED ORDER — PROPOFOL 500 MG/50ML IV EMUL
INTRAVENOUS | Status: DC | PRN
  Administered 2023-08-24: 25 ug/kg/min via INTRAVENOUS

## 2023-08-24 MED ORDER — ACETIC ACID 4% SOLUTION
Status: DC | PRN
  Administered 2023-08-24: 1 via TOPICAL

## 2023-08-24 MED ORDER — BUPIVACAINE HCL (PF) 0.25 % IJ SOLN
INTRAMUSCULAR | Status: AC
  Filled 2023-08-24: qty 30

## 2023-08-24 MED ORDER — BUPIVACAINE HCL (PF) 0.25 % IJ SOLN
INTRAMUSCULAR | Status: DC | PRN
  Administered 2023-08-24: 20 mL

## 2023-08-24 MED ORDER — LIDOCAINE-EPINEPHRINE 0.5 %-1:200000 IJ SOLN
INTRAMUSCULAR | Status: AC
  Filled 2023-08-24: qty 50

## 2023-08-24 MED ORDER — FENTANYL CITRATE PF 50 MCG/ML IJ SOSY
25.0000 ug | PREFILLED_SYRINGE | INTRAMUSCULAR | Status: DC | PRN
  Administered 2023-08-24: 50 ug via INTRAVENOUS
  Filled 2023-08-24: qty 1

## 2023-08-24 MED ORDER — CHLORHEXIDINE GLUCONATE 0.12 % MT SOLN
15.0000 mL | Freq: Once | OROMUCOSAL | Status: AC
  Administered 2023-08-24: 15 mL via OROMUCOSAL

## 2023-08-24 MED ORDER — ORAL CARE MOUTH RINSE: 15.0000 mL | Freq: Once | OROMUCOSAL | Status: AC

## 2023-08-24 MED ORDER — FENTANYL CITRATE (PF) 250 MCG/5ML IJ SOLN
INTRAMUSCULAR | Status: AC
  Filled 2023-08-24: qty 5

## 2023-08-24 MED ORDER — GABAPENTIN 300 MG PO CAPS: 300.0000 mg | ORAL_CAPSULE | Freq: Three times a day (TID) | ORAL | 0 refills | Status: DC

## 2023-08-24 MED ORDER — PROPOFOL 10 MG/ML IV BOLUS
INTRAVENOUS | Status: DC | PRN
  Administered 2023-08-24: 50 mg via INTRAVENOUS
  Administered 2023-08-24: 150 mg via INTRAVENOUS

## 2023-08-24 MED ORDER — 0.9 % SODIUM CHLORIDE (POUR BTL) OPTIME
TOPICAL | Status: DC | PRN
  Administered 2023-08-24: 1000 mL

## 2023-08-24 MED ORDER — DEXMEDETOMIDINE HCL IN NACL 80 MCG/20ML IV SOLN
INTRAVENOUS | Status: DC | PRN
  Administered 2023-08-24: 8 ug via INTRAVENOUS

## 2023-08-24 MED ORDER — LIDOCAINE-EPINEPHRINE 0.5 %-1:200000 IJ SOLN
INTRAMUSCULAR | Status: DC | PRN
  Administered 2023-08-24: 30 mL

## 2023-08-24 MED ORDER — MIDAZOLAM HCL 2 MG/2ML IJ SOLN
INTRAMUSCULAR | Status: AC
  Filled 2023-08-24: qty 2

## 2023-08-24 MED ORDER — ONDANSETRON HCL 4 MG/2ML IJ SOLN
INTRAMUSCULAR | Status: AC
  Filled 2023-08-24: qty 2

## 2023-08-24 MED ORDER — SCOPOLAMINE 1 MG/3DAYS TD PT72
MEDICATED_PATCH | TRANSDERMAL | Status: DC | PRN
  Administered 2023-08-24: 1 via TRANSDERMAL

## 2023-08-24 MED ORDER — LIDOCAINE 2% (20 MG/ML) 5 ML SYRINGE
INTRAMUSCULAR | Status: DC | PRN
  Administered 2023-08-24: 60 mg via INTRAVENOUS

## 2023-08-24 MED ORDER — PROPOFOL 10 MG/ML IV BOLUS
INTRAVENOUS | Status: AC
  Filled 2023-08-24: qty 20

## 2023-08-24 MED ORDER — MONSELS FERRIC SUBSULFATE EX SOLN
CUTANEOUS | Status: AC
  Filled 2023-08-24: qty 8

## 2023-08-24 MED ORDER — ROCURONIUM BROMIDE 10 MG/ML (PF) SYRINGE
PREFILLED_SYRINGE | INTRAVENOUS | Status: AC
  Filled 2023-08-24: qty 10

## 2023-08-24 MED ORDER — SCOPOLAMINE 1 MG/3DAYS TD PT72
MEDICATED_PATCH | TRANSDERMAL | Status: AC
  Filled 2023-08-24: qty 1

## 2023-08-24 MED ORDER — FENTANYL CITRATE (PF) 250 MCG/5ML IJ SOLN
INTRAMUSCULAR | Status: DC | PRN
  Administered 2023-08-24: 100 ug via INTRAVENOUS
  Administered 2023-08-24 (×2): 50 ug via INTRAVENOUS

## 2023-08-24 MED ORDER — LIDOCAINE HCL (PF) 2 % IJ SOLN
INTRAMUSCULAR | Status: AC
  Filled 2023-08-24: qty 5

## 2023-08-24 MED ORDER — CHLORHEXIDINE GLUCONATE 0.12 % MT SOLN
OROMUCOSAL | Status: AC
  Filled 2023-08-24: qty 15

## 2023-08-24 MED ORDER — KETOROLAC TROMETHAMINE 30 MG/ML IJ SOLN
30.0000 mg | INTRAMUSCULAR | Status: AC
  Administered 2023-08-24: 30 mg via INTRAVENOUS
  Filled 2023-08-24: qty 1

## 2023-08-24 MED ORDER — MIDAZOLAM HCL 2 MG/2ML IJ SOLN
INTRAMUSCULAR | Status: DC | PRN
  Administered 2023-08-24: 2 mg via INTRAVENOUS

## 2023-08-24 MED ORDER — ONDANSETRON HCL 4 MG/2ML IJ SOLN: 4.0000 mg | Freq: Once | INTRAMUSCULAR | Status: DC | PRN

## 2023-08-24 MED ORDER — OXYCODONE HCL 5 MG PO TABS
5.0000 mg | ORAL_TABLET | Freq: Once | ORAL | Status: AC | PRN
  Administered 2023-08-24: 5 mg via ORAL
  Filled 2023-08-24: qty 1

## 2023-08-24 SURGICAL SUPPLY — 62 items
ADH SKN CLS APL DERMABOND .7 (GAUZE/BANDAGES/DRESSINGS) ×2
APL PRP STRL LF DISP 70% ISPRP (MISCELLANEOUS) ×2
APL SRG 38 LTWT LNG FL B (MISCELLANEOUS)
APL SWBSTK 6 STRL LF DISP (MISCELLANEOUS) ×2
APPLICATOR ARISTA FLEXITIP XL (MISCELLANEOUS) IMPLANT
APPLICATOR COTTON TIP 6 STRL (MISCELLANEOUS) ×2 IMPLANT
APPLICATOR COTTON TIP 6IN STRL (MISCELLANEOUS) ×2
BLADE SURG SZ11 CARB STEEL (BLADE) ×2 IMPLANT
CHLORAPREP W/TINT 26 (MISCELLANEOUS) ×2 IMPLANT
CLOTH BEACON ORANGE TIMEOUT ST (SAFETY) ×2 IMPLANT
COVER LIGHT HANDLE STERIS (MISCELLANEOUS) ×4 IMPLANT
COVER MAYO STAND XLG (MISCELLANEOUS) ×2 IMPLANT
DERMABOND ADVANCED .7 DNX12 (GAUZE/BANDAGES/DRESSINGS) ×2 IMPLANT
DILATOR CANAL MILEX (MISCELLANEOUS) ×2 IMPLANT
DRAPE ARM DVNC X/XI (DISPOSABLE) ×6 IMPLANT
DRAPE COLUMN DVNC XI (DISPOSABLE) ×2 IMPLANT
ELECT BALL LEEP 5MM RED (ELECTRODE) ×2 IMPLANT
ELECT LOOP LEEP RND 20X12 WHT (CUTTING LOOP) ×2
ELECT REM PT RETURN 9FT ADLT (ELECTROSURGICAL) ×2
ELECTRODE LOOP LP RND 20X12WHT (CUTTING LOOP) IMPLANT
ELECTRODE REM PT RTRN 9FT ADLT (ELECTROSURGICAL) ×2 IMPLANT
GAUZE 4X4 16PLY ~~LOC~~+RFID DBL (SPONGE) ×4 IMPLANT
GLOVE BIO SURGEON STRL SZ 6.5 (GLOVE) ×6 IMPLANT
GLOVE BIO SURGEON STRL SZ7 (GLOVE) ×2 IMPLANT
GLOVE BIOGEL PI IND STRL 7.0 (GLOVE) ×10 IMPLANT
GOWN STRL REUS W/ TWL LRG LVL3 (GOWN DISPOSABLE) ×4 IMPLANT
GOWN STRL REUS W/TWL LRG LVL3 (GOWN DISPOSABLE) ×6 IMPLANT
KIT PINK PAD W/HEAD ARE REST (MISCELLANEOUS) ×2
KIT PINK PAD W/HEAD ARM REST (MISCELLANEOUS) ×2 IMPLANT
KIT TURNOVER CYSTO (KITS) ×2 IMPLANT
KIT TURNOVER KIT A (KITS) ×2 IMPLANT
MANIFOLD NEPTUNE II (INSTRUMENTS) ×2 IMPLANT
NDL HYPO 18GX1.5 BLUNT FILL (NEEDLE) ×2 IMPLANT
NDL HYPO 21X1.5 SAFETY (NEEDLE) ×2 IMPLANT
NDL INSUFFLATION 14GA 120MM (NEEDLE) ×2 IMPLANT
NEEDLE HYPO 18GX1.5 BLUNT FILL (NEEDLE) ×2
NEEDLE HYPO 21X1.5 SAFETY (NEEDLE) ×2
NEEDLE INSUFFLATION 14GA 120MM (NEEDLE) ×2
NS IRRIG 1000ML POUR BTL (IV SOLUTION) ×2 IMPLANT
NS IRRIG 500ML POUR BTL (IV SOLUTION) ×2 IMPLANT
OBTURATOR OPTICAL STND 8 DVNC (TROCAR) ×2
OBTURATOR OPTICALSTD 8 DVNC (TROCAR) ×2 IMPLANT
PACK PERI GYN (CUSTOM PROCEDURE TRAY) ×2 IMPLANT
PAD ARMBOARD 7.5X6 YLW CONV (MISCELLANEOUS) ×2 IMPLANT
PAD TELFA 3X4 1S STER (GAUZE/BANDAGES/DRESSINGS) ×2 IMPLANT
POSITIONER HEAD 8X9X4 ADT (SOFTGOODS) ×2 IMPLANT
SCOPETTES 8 STERILE (MISCELLANEOUS) ×2 IMPLANT
SEAL UNIV 5-12 XI (MISCELLANEOUS) ×6 IMPLANT
SEALER VESSEL EXT DVNC XI (MISCELLANEOUS) ×2 IMPLANT
SET BASIN LINEN APH (SET/KITS/TRAYS/PACK) ×2 IMPLANT
SET TUBE IRRIG SUCTION NO TIP (IRRIGATION / IRRIGATOR) IMPLANT
SET TUBE SMOKE EVAC HIGH FLOW (TUBING) ×2 IMPLANT
SOL ANTI FOG 6CC (MISCELLANEOUS) ×2 IMPLANT
SUT MNCRL AB 4-0 PS2 18 (SUTURE) ×2 IMPLANT
SUT VIC AB 0 CT1 27 (SUTURE) ×2
SUT VIC AB 0 CT1 27XBRD ANBCTR (SUTURE) ×2 IMPLANT
SYR 10ML LL (SYRINGE) ×2 IMPLANT
SYR CONTROL 10ML LL (SYRINGE) ×2 IMPLANT
TAPE TRANSPORE STRL 2 31045 (GAUZE/BANDAGES/DRESSINGS) ×2 IMPLANT
TRAY FOLEY W/BAG SLVR 16FR (SET/KITS/TRAYS/PACK) ×2
TRAY FOLEY W/BAG SLVR 16FR ST (SET/KITS/TRAYS/PACK) IMPLANT
WATER STERILE IRR 500ML POUR (IV SOLUTION) ×2 IMPLANT

## 2023-08-24 NOTE — Op Note (Signed)
PREOPERATIVE DIAGNOSIS:  1) desires sterilization 2) high grade cervical dysplasia POSTOPERATIVE DIAGNOSIS: same PROCEDURE PERFORMED: Robotic assisted- laparoscopic bilateral salpingectomy, LEEP SURGEON: Dr. Myna Hidalgo ANESTHESIA: General endotracheal.  ESTIMATED BLOOD LOSS: 20 cc.  IV FLUIDS: 800 cc of crystalloid.  SPECIMEN(S): bilateral fallopian tubes COMPLICATIONS: uterine perforation.  CONDITION: Stable.   FINDINGS: No ascites or peritoneal studding was appreciated.  Grossly normal appearing bowel.  Retroverted uterus normal size and shape.  Ovaries and tubes were normal in appearance.     Informed consent was obtained from the patient prior to taking her to the operating room where anesthesia was found to be adequate. She was placed in dorsal lithotomy position and examined under anesthesia. She was prepped and draped in normal sterile fashion. The bladder was catheterized with a foley under sterile technique.  A bi-valve speculum was then placed and the anterior lip of the cervix was grasped with the single tooth tenaculum. The hulka uterine manipulator was then advanced into the uterus to provide uterine mobility. The speculum and tenaculum were then removed.   Attention was then turned to the patients abdomen where a 8 mm infraumbilical skin incision was made with the scalpel. The veress needle was carefully introduced into the peritoneal cavity while tenting the abdominal wall. Intraperitoneal placement was confirmed by use of a saline-drop test.  The gas was connected and confirmed intrabdominal placement by a low initial pressure of . The abdomen was then insuflated with CO2 gas. The trocar and sleeve were then advanced without difficulty into the abdomen under direct visualization. Intraabdominal placement was confirmed by the laparoscope and surveillance of the abdomen was performed. Grossly normal appearing abdomen as mentioned in findings above.  Two additional right and  left lower quadrant 5mm skin incision were made with placement of the trocar under direct visualization. The patient was placed in Trendelenburg position and the [employer] robotic device was docked.  Next, attention was turned to the console where the salpingectomy was performed.     Uterine perforation with the Hulka was noted with bleeding.  The Hulka was removed.  The fenestrated bipolars were used to obtain hemostasis.  Excellent hemostasis was noted.     The left fallopian tube was grasped and using the vessel sealer was excised in its entirety. The tube was removed through the port without complications.  A similar procedure was performed on the right with removal of the fallopian tube. Excellent hemostasis was noted.  The robot was undocked.  Under direct visualization TAP block was completed under direct visualization using 10cc of 0.25% marcaine in each of four locations.  The instruments were then removed from the patients abdomen with air allowed to fully escape. All ports were closed with monocryl and dermabond.    The patient tolerated the procedure well with all sponge, lap, and needle counts correct.   Attention was then turned vaginally to the LEEP procedure.  A bivalved coated speculum was placed in the patient's vagina. A grounding pad placed on the patient. Acetic acid solution was applied to the cervix and areas of decreased uptake were noted around the transformation zone.   Local anesthesia was administered via an intracervical block using 20 ml of 0.5% Lidocaine with epinephrine. The suction was turned on and the Large 1X Fisher Cone Biopsy Excisor on 50 Watts of blended current was used to excise the area of decreased uptake and excise the entire transformation zone.  Excellent hemostasis was achieved using roller ball coagulation set at 50 [employer]  coagulation current. Monsel's solution was then applied and the speculum was removed from the vagina. Specimens were sent to  pathology.  Counts were correct and pt was taken to recovery room in stable condition.  Myna Hidalgo, DO Attending Obstetrician & Gynecologist, [facility] for [employer], [facility]

## 2023-08-24 NOTE — Anesthesia Preprocedure Evaluation (Signed)
Anesthesia Evaluation  Patient identified by MRN, date of birth, ID band Patient awake    Reviewed: Allergy & Precautions, H&P , NPO status , Patient's Chart, lab work & pertinent test results, reviewed documented beta blocker date and time   Airway Mallampati: II  TM Distance: >3 FB Neck ROM: full    Dental no notable dental hx.    Pulmonary neg pulmonary ROS, Current Smoker   Pulmonary exam normal breath sounds clear to auscultation       Cardiovascular Exercise Tolerance: Good negative cardio ROS  Rhythm:regular Rate:Normal     Neuro/Psych negative neurological ROS  negative psych ROS   GI/Hepatic negative GI ROS, Neg liver ROS,,,  Endo/Other  negative endocrine ROS    Renal/GU negative Renal ROS  negative genitourinary   Musculoskeletal   Abdominal   Peds  Hematology negative hematology ROS (+)   Anesthesia Other Findings   Reproductive/Obstetrics negative OB ROS                             Anesthesia Physical Anesthesia Plan  ASA: 2  Anesthesia Plan: General and General ETT   Post-op Pain Management:    Induction:   PONV Risk Score and Plan: Ondansetron  Airway Management Planned:   Additional Equipment:   Intra-op Plan:   Post-operative Plan:   Informed Consent: I have reviewed the patients History and Physical, chart, labs and discussed the procedure including the risks, benefits and alternatives for the proposed anesthesia with the patient or authorized representative who has indicated his/her understanding and acceptance.     Dental Advisory Given  Plan Discussed with: CRNA  Anesthesia Plan Comments:        Anesthesia Quick Evaluation

## 2023-08-24 NOTE — Transfer of Care (Signed)
Immediate Anesthesia Transfer of Care Note  Patient: [name]  Procedure(s) Performed: XI ROBOTIC ASSISTED SALPINGECTOMY (Bilateral) LOOP ELECTROSURGICAL EXCISION PROCEDURE (LEEP)  Patient Location: PACU  Anesthesia Type:General  Level of Consciousness: awake  Airway & Oxygen Therapy: Patient Spontanous Breathing and Patient connected to face mask oxygen  Post-op Assessment: Report given to RN and Post -op Vital signs reviewed and stable  Post vital signs: Reviewed and stable  Last Vitals:  Vitals Value Taken Time  BP    Temp    Pulse 86 08/24/23 1254  Resp    SpO2 100 % 08/24/23 1254  Vitals shown include unfiled device data.  Last Pain:  Vitals:   08/24/23 0932  TempSrc: Oral  PainSc: 0-No pain      Patients Stated Pain Goal: 5 (08/24/23 0932)  Complications: No notable events documented.

## 2023-08-24 NOTE — Discharge Instructions (Signed)
HOME INSTRUCTIONS  Please note any unusual or excessive bleeding, pain, swelling. Mild dizziness or drowsiness are normal for about 24 hours after surgery.   Shower when comfortable  Restrictions: No driving for 24 hours or while taking pain medications.  Activity:  No heavy lifting (> 10 lbs), nothing in vagina (no tampons, douching, or intercourse) x 4 weeks; no tub baths for 4 weeks Vaginal spotting is expected but if your bleeding is heavy, period like,  please call the office   Diet:  You may return to your regular diet.  Do not eat large meals.  Eat small frequent meals throughout the day.  Continue to drink a good amount of water at least 6-8 glasses of water per day, hydration is very important for the healing process.  Pain Management: Take over the counter tylenol as needed for pain.  A prescription of Advil (ibuprofen) 600mg  to take every 6 hours. Gabapentin has also been sent in for you to take three times daily for the next 3 days.  You may also use a heating pack as needed.    Alcohol -- Avoid for 24 hours and while taking pain medications.  Nausea: Take sips of ginger ale or soda  Fever -- Call physician if temperature over 101 degrees  Follow up:  If you do not already have a follow up appointment scheduled, please call the office at [phone].  If you experience fever (a temperature greater than 100.4), pain unrelieved by pain medication, shortness of breath, swelling of a single leg, or any other symptoms which are concerning to you please the office immediately.

## 2023-08-24 NOTE — Anesthesia Procedure Notes (Signed)
Procedure Name: Intubation Date/Time: 08/24/2023 11:37 AM  Performed by: Lorin Glass, CRNAPre-anesthesia Checklist: Patient identified, Emergency Drugs available, Suction available and Patient being monitored Patient Re-evaluated:Patient Re-evaluated prior to induction Oxygen Delivery Method: Circle system utilized Preoxygenation: Pre-oxygenation with 100% oxygen Induction Type: IV induction Ventilation: Mask ventilation without difficulty Laryngoscope Size: Mac and 3 Grade View: Grade I Tube type: Oral Tube size: 7.0 mm Number of attempts: 1 Airway Equipment and Method: Stylet Placement Confirmation: ETT inserted through vocal cords under direct vision, positive ETCO2 and breath sounds checked- equal and bilateral Secured at: 20 cm Tube secured with: Tape Dental Injury: Teeth and Oropharynx as per pre-operative assessment

## 2023-08-25 ENCOUNTER — Encounter (HOSPITAL_COMMUNITY): Payer: Self-pay | Admitting: Obstetrics & Gynecology

## 2023-08-25 NOTE — Anesthesia Postprocedure Evaluation (Signed)
Anesthesia Post Note  Patient: [name]  Procedure(s) Performed: XI ROBOTIC ASSISTED SALPINGECTOMY (Bilateral) LOOP ELECTROSURGICAL EXCISION PROCEDURE (LEEP)  Patient location during evaluation: Phase II Anesthesia Type: General Level of consciousness: awake Pain management: pain level controlled Vital Signs Assessment: post-procedure vital signs reviewed and stable Respiratory status: spontaneous breathing and respiratory function stable Cardiovascular status: blood pressure returned to baseline and stable Postop Assessment: no headache and no apparent nausea or vomiting Anesthetic complications: no Comments: Late entry   No notable events documented.   Last Vitals:  Vitals:   08/24/23 1330 08/24/23 1339  BP: 109/70 111/71  Pulse: 85 82  Resp: 12 16  Temp:  (!) 36.1 C  SpO2: 97% 100%    Last Pain:  Vitals:   08/24/23 1339  TempSrc: Oral  PainSc: 5                  Windell Norfolk

## 2023-09-02 LAB — SURGICAL PATHOLOGY

## 2023-09-03 ENCOUNTER — Encounter: Payer: Medicaid Other | Admitting: Obstetrics & Gynecology

## 2023-09-06 ENCOUNTER — Ambulatory Visit (INDEPENDENT_AMBULATORY_CARE_PROVIDER_SITE_OTHER): Payer: Medicaid Other | Admitting: Obstetrics & Gynecology

## 2023-09-06 ENCOUNTER — Encounter: Payer: Self-pay | Admitting: Obstetrics & Gynecology

## 2023-09-06 VITALS — BP 106/66 | HR 98 | Ht 62.0 in | Wt 140.2 lb

## 2023-09-06 DIAGNOSIS — Z4889 Encounter for other specified surgical aftercare: Secondary | ICD-10-CM

## 2023-09-06 DIAGNOSIS — Z9889 Other specified postprocedural states: Secondary | ICD-10-CM

## 2023-09-06 NOTE — Progress Notes (Signed)
    PostOp Visit Note  Rebekah [name] is a 32 y.o. G62P3003 female who presents for a postoperative visit. She is 2 weeks postop following a robotic-assisted laparoscopic salpingectomy and LEEP completed on 8/27   Today she notes that she is doing great.  Denies fever or chills.  Tolerating gen diet.  +Flatus, Regular BMs.  Pain is minimal and already back to pretty much regular activity.  No acute complaints   Review of Systems Pertinent items are noted in HPI.    Objective:  BP 106/66 (BP Location: Right Arm, Patient Position: Sitting, Cuff Size: Normal)   Pulse 98   Ht 5\' 2"  (1.575 m)   Wt 140 lb 3.2 oz (63.6 kg)   LMP 10/22/2022   BMI 25.64 kg/m    Physical Examination:  GENERAL ASSESSMENT: well developed and well nourished SKIN: normal color, no lesions CHEST: normal air exchange, respiratory effort normal with no retractions HEART: regular rate and rhythm ABDOMEN: soft, non-distended, no rebound, no guarding INCISION: well healed- C/D/I GU: normal external genitalia, vaginal pink moist mucosa, cervix well healed EXTREMITY: no edema, no calf tenderness bilaterally PSYCH: mood appropriate, normal affect       Assessment:    Post op visit  S/p Robotic-assisted bilateral salpingectomy and LEEP   Plan:   -meeting postop milestones appropriately -pelvic rest for another 3 wks -f/u in 6mos for pap  Myna Hidalgo, DO Attending Obstetrician & Gynecologist, [facility] for [employer], [facility]

## 2023-09-17 ENCOUNTER — Telehealth: Payer: Self-pay | Admitting: Pediatrics

## 2023-09-17 NOTE — Telephone Encounter (Signed)
NAS postpartum telephone consult complete.  Rebekah [name] is doing well.  Rebekah [name] is nearing 24 weeks old and has been home from NICU since the end of July.  He is feeding well and about 10 lbs, some difficulty with nighttime sleep.  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  Rebekah [name] has consult number if needs arise.  WIll follow again on 10/18.

## 2024-01-17 ENCOUNTER — Encounter: Payer: Self-pay | Admitting: Women's Health

## 2024-01-17 ENCOUNTER — Ambulatory Visit: Payer: Medicaid Other | Admitting: Women's Health

## 2024-01-17 VITALS — BP 118/74 | HR 107 | Ht 62.0 in | Wt 145.0 lb

## 2024-01-17 DIAGNOSIS — F53 Postpartum depression: Secondary | ICD-10-CM | POA: Insufficient documentation

## 2024-01-17 DIAGNOSIS — F418 Other specified anxiety disorders: Secondary | ICD-10-CM

## 2024-01-17 DIAGNOSIS — O99345 Other mental disorders complicating the puerperium: Secondary | ICD-10-CM | POA: Diagnosis not present

## 2024-01-17 MED ORDER — SERTRALINE HCL 25 MG PO TABS: 25.0000 mg | ORAL_TABLET | Freq: Every day | ORAL | 6 refills | Status: AC

## 2024-01-17 NOTE — Progress Notes (Signed)
GYN VISIT Patient name: Rebekah [name] MRN [id number]  Date of birth: January 14, 1991 Chief Complaint:   Depression (Feeling overwhelmed, sad all the time)  History of Present Illness:   Rebekah [name] is a 33 y.o. G90P3003 Caucasian female being seen today for postpartum dep/anx.  S/P SVB 06/27/23. No h/o dep/anx, never on meds. Feels overwhelmed, easily irritated, sad. Occ thoughts of self harm, no plan, states she would never act on thoughts, her children need her. Doesn't have a lot of support at home, partner works and they are building a house, so he stays busy w/ that. No family that can help. Eats ok, sleeps a lot, doesn't really find joy in things she used to. Bottlefeeding.  No LMP recorded. (Menstrual status: Lactating). The current method of family planning is  BTS .  Last pap 10/01/22. 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     01/17/2024   11:30 AM 01/14/2023    8:10 AM  Depression screen PHQ 2/9  Decreased Interest 1 2  Down, Depressed, Hopeless 2 2  PHQ - 2 Score 3 4  Altered sleeping 3 2  Tired, decreased energy 3 2  Change in appetite 0 1  Feeling bad or failure about yourself  3 2  Trouble concentrating 3 2  Moving slowly or fidgety/restless 0 1  Suicidal thoughts 1 0  PHQ-9 Score 16 14        01/17/2024   11:31 AM 01/14/2023    8:10 AM  GAD 7 : Generalized Anxiety Score  Nervous, Anxious, on Edge 1 2  Control/stop worrying 1 2  Worry too much - different things 3 2  Trouble relaxing 3 2  Restless 1 1  Easily annoyed or irritable 3 3  Afraid - awful might happen 1 1  Total GAD 7 Score 13 13     Review of Systems:   Pertinent items are noted in HPI Denies fever/chills, dizziness, headaches, visual disturbances, fatigue, shortness of breath, chest pain, abdominal pain, vomiting, abnormal vaginal discharge/itching/odor/irritation, problems with periods, bowel movements, urination, or intercourse unless otherwise stated  above.  Pertinent History Reviewed:  Reviewed past medical,surgical, social, obstetrical and family history.  Reviewed problem list, medications and allergies. Physical Assessment:   Vitals:   01/17/24 1126  BP: 118/74  Pulse: (!) 107  Weight: 145 lb (65.8 kg)  Height: 5\' 2"  (1.575 m)  Body mass index is 26.52 kg/m.       Physical Examination:   General appearance: alert, well appearing, and in no distress  Mental status: alert, oriented to person, place, and time  Skin: warm & dry   Cardiovascular: normal heart rate noted  Respiratory: normal respiratory effort, no distress  Abdomen: soft, non-tender   Pelvic: examination not indicated  Extremities: no edema   Chaperone: N/A    No results found for this or any previous visit (from the past 24 hours).  Assessment & Plan:  1) PPD/anxiety w/ occ thoughts of self harm> postpartum, states would never act on thoughts. Discussed options, wants to try meds + therapy. Urgent [facility] referral ordered and note routed to [facility]. Rx zoloft 25mg , understands can take few weeks to notice improvement. F/U 4wks. 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    Dispense:  30 tablet    Refill:  6    Orders Placed This Encounter  Procedures   Amb ref to [facility]    Return in about 4 weeks (around 02/14/2024) for med f/u, CNM, in person.  Cheral Marker CNM, [facility] 01/17/2024 11:52 AM

## 2024-02-14 ENCOUNTER — Ambulatory Visit: Payer: Medicaid Other | Admitting: Women's Health
